# Patient Record
Sex: Male | Born: 1955 | Race: White | Hispanic: No | Marital: Single | Smoking: Former smoker
Health system: Southern US, Community
[De-identification: ages and names within clinical notes are randomized; demographics above are authoritative.]

## PROBLEM LIST (undated history)

## (undated) ENCOUNTER — Emergency Department (HOSPITAL_COMMUNITY): Payer: Self-pay | Source: Home / Self Care

## (undated) DIAGNOSIS — I10 Essential (primary) hypertension: Secondary | ICD-10-CM

## (undated) DIAGNOSIS — J449 Chronic obstructive pulmonary disease, unspecified: Secondary | ICD-10-CM

## (undated) DIAGNOSIS — E119 Type 2 diabetes mellitus without complications: Secondary | ICD-10-CM

## (undated) DIAGNOSIS — E785 Hyperlipidemia, unspecified: Secondary | ICD-10-CM

## (undated) DIAGNOSIS — I739 Peripheral vascular disease, unspecified: Secondary | ICD-10-CM

## (undated) DIAGNOSIS — I890 Lymphedema, not elsewhere classified: Secondary | ICD-10-CM

## (undated) HISTORY — DX: Hyperlipidemia, unspecified: E78.5

## (undated) HISTORY — DX: Peripheral vascular disease, unspecified: I73.9

---

## 2006-08-09 ENCOUNTER — Ambulatory Visit: Payer: Self-pay | Admitting: Family Medicine

## 2006-08-19 ENCOUNTER — Ambulatory Visit: Payer: Self-pay | Admitting: Family Medicine

## 2009-04-14 ENCOUNTER — Emergency Department: Payer: Self-pay | Admitting: Emergency Medicine

## 2012-06-07 ENCOUNTER — Ambulatory Visit: Payer: Self-pay | Admitting: Family Medicine

## 2012-07-17 ENCOUNTER — Ambulatory Visit: Payer: Self-pay | Admitting: Family Medicine

## 2012-09-20 ENCOUNTER — Ambulatory Visit: Payer: Self-pay | Admitting: Internal Medicine

## 2013-04-08 ENCOUNTER — Ambulatory Visit: Payer: Self-pay | Admitting: Internal Medicine

## 2014-02-11 ENCOUNTER — Ambulatory Visit: Payer: Self-pay | Admitting: Internal Medicine

## 2015-06-20 ENCOUNTER — Encounter (HOSPITAL_COMMUNITY): Payer: Self-pay | Admitting: Emergency Medicine

## 2015-06-20 HISTORY — DX: Essential (primary) hypertension: I10

## 2015-06-20 HISTORY — DX: Chronic obstructive pulmonary disease, unspecified: J44.9

## 2015-07-02 ENCOUNTER — Encounter (HOSPITAL_COMMUNITY): Payer: Self-pay | Admitting: Emergency Medicine

## 2015-12-23 ENCOUNTER — Ambulatory Visit: Payer: Self-pay | Admitting: Surgery

## 2015-12-28 ENCOUNTER — Encounter: Payer: Medicaid Other | Attending: Surgery | Admitting: Surgery

## 2015-12-28 ENCOUNTER — Ambulatory Visit
Admission: RE | Admit: 2015-12-28 | Discharge: 2015-12-28 | Disposition: A | Discharge status: Home / Self Care | Payer: Medicaid Other | Source: Ambulatory Visit | Attending: Surgery | Admitting: Surgery

## 2015-12-28 ENCOUNTER — Other Ambulatory Visit: Payer: Self-pay | Admitting: Surgery

## 2015-12-28 DIAGNOSIS — I10 Essential (primary) hypertension: Secondary | ICD-10-CM | POA: Insufficient documentation

## 2015-12-28 DIAGNOSIS — E11621 Type 2 diabetes mellitus with foot ulcer: Secondary | ICD-10-CM | POA: Diagnosis not present

## 2015-12-28 DIAGNOSIS — M868X7 Other osteomyelitis, ankle and foot: Secondary | ICD-10-CM | POA: Diagnosis not present

## 2015-12-28 DIAGNOSIS — E785 Hyperlipidemia, unspecified: Secondary | ICD-10-CM | POA: Diagnosis not present

## 2015-12-28 DIAGNOSIS — L0889 Other specified local infections of the skin and subcutaneous tissue: Secondary | ICD-10-CM | POA: Insufficient documentation

## 2015-12-28 DIAGNOSIS — I890 Lymphedema, not elsewhere classified: Secondary | ICD-10-CM | POA: Insufficient documentation

## 2015-12-28 DIAGNOSIS — I739 Peripheral vascular disease, unspecified: Secondary | ICD-10-CM | POA: Insufficient documentation

## 2015-12-28 DIAGNOSIS — M869 Osteomyelitis, unspecified: Secondary | ICD-10-CM

## 2015-12-28 DIAGNOSIS — F17218 Nicotine dependence, cigarettes, with other nicotine-induced disorders: Secondary | ICD-10-CM | POA: Diagnosis not present

## 2015-12-28 DIAGNOSIS — E1165 Type 2 diabetes mellitus with hyperglycemia: Secondary | ICD-10-CM | POA: Diagnosis not present

## 2015-12-28 DIAGNOSIS — Z9889 Other specified postprocedural states: Secondary | ICD-10-CM | POA: Diagnosis not present

## 2015-12-28 DIAGNOSIS — L97512 Non-pressure chronic ulcer of other part of right foot with fat layer exposed: Secondary | ICD-10-CM | POA: Insufficient documentation

## 2015-12-29 NOTE — Progress Notes (Addendum)
Dennis Fitzgerald, Dennis Fitzgerald ([id number]) Visit Report for 12/28/2015 Abuse/Suicide Risk Screen Details Patient Name: Dennis Fitzgerald, Dennis Fitzgerald Date of Service: 12/28/2015 10:15 AM Medical Record Number: [id number] Patient Account Number: [id number] Date of Birth/Sex: 09-27-1956 (59 y.o. Male) Treating RN: Curtis Sites Primary Care Physician: Darreld Mclean Other Clinician: Referring Physician: Darreld Mclean Treating Physician/Extender: Rudene Re in Treatment: 0 Abuse/Suicide Risk Screen Items Answer ABUSE/SUICIDE RISK SCREEN: Has anyone close to you tried to hurt or harm you recentlyo No Do you feel uncomfortable with anyone in your familyo No Has anyone forced you do things that you didnot want to doo No Do you have any thoughts of harming yourselfo No Patient displays signs or symptoms of abuse and/or neglect. No Electronic Signature(s) Signed: 12/30/2015 11:38:14 AM By: Curtis Sites Previous Signature: 12/28/2015 5:10:42 PM Version By: Curtis Sites Entered By: Curtis Sites on 12/30/2015 11:38:14 Dennis Fitzgerald ([id number]) -------------------------------------------------------------------------------- Activities of Daily Living Details Patient Name: Dennis Fitzgerald Date of Service: 12/28/2015 10:15 AM Medical Record Number: [id number] Patient Account Number: [id number] Date of Birth/Sex: [birth year] (60 y.o. Male) Treating RN: Curtis Sites Primary Care Physician: Darreld Mclean Other Clinician: Referring Physician: Darreld Mclean Treating Physician/Extender: Rudene Re in Treatment: 0 Activities of Daily Living Items Answer Activities of Daily Living (Please select one for each item) Drive Automobile Completely Able Take Medications Completely Able Use Telephone Completely Able Care for Appearance Completely Able Use Toilet Completely Able Bath / Shower Completely Able Dress Self Completely Able Feed Self Completely Able Walk Completely Able Get In / Out Bed Completely Able Housework  Completely Able Prepare Meals Completely Able Handle Money Completely Able Shop for Self Completely Able Electronic Signature(s) Signed: 12/30/2015 11:38:31 AM By: Curtis Sites Previous Signature: 12/28/2015 5:10:42 PM Version By: Curtis Sites Entered By: Curtis Sites on 12/30/2015 11:38:31 Dennis Fitzgerald ([id number]) -------------------------------------------------------------------------------- Education Assessment Details Patient Name: Dennis Fitzgerald Date of Service: 12/28/2015 10:15 AM Medical Record Number: [id number] Patient Account Number: [id number] Date of Birth/Sex: 05-03-1956 (60 y.o. Male) Treating RN: Curtis Sites Primary Care Physician: Darreld Mclean Other Clinician: Referring Physician: Darreld Mclean Treating Physician/Extender: Rudene Re in Treatment: 0 Primary Learner Assessed: Patient Learning Preferences/Education Level/Primary Language Learning Preference: Explanation, Demonstration Highest Education Level: High School Preferred Language: English Cognitive Barrier Assessment/Beliefs Language Barrier: No Translator Needed: No Memory Deficit: No Emotional Barrier: No Cultural/Religious Beliefs Affecting Medical No Care: Physical Barrier Assessment Impaired Vision: No Impaired Hearing: No Decreased Hand dexterity: No Knowledge/Comprehension Assessment Knowledge Level: Medium Comprehension Level: Medium Ability to understand written Medium instructions: Ability to understand verbal Medium instructions: Motivation Assessment Anxiety Level: Calm Cooperation: Cooperative Education Importance: Acknowledges Need Interest in Health Problems: Asks Questions Perception: Coherent Willingness to Engage in Self- Medium Management Activities: Readiness to Engage in Self- Medium Management Activities: Electronic Signature(s) Dennis Fitzgerald, Dennis Fitzgerald ([id number]) Signed: 12/30/2015 11:38:43 AM By: Curtis Sites Previous Signature: 12/28/2015 5:10:42 PM Version  By: Curtis Sites Entered By: Curtis Sites on 12/30/2015 11:38:42 Dennis Fitzgerald ([id number]) -------------------------------------------------------------------------------- Fall Risk Assessment Details Patient Name: Dennis Fitzgerald Date of Service: 12/28/2015 10:15 AM Medical Record Number: [id number] Patient Account Number: [id number] Date of Birth/Sex: Jan 05, 1956 (60 y.o. Male) Treating RN: Curtis Sites Primary Care Physician: Darreld Mclean Other Clinician: Referring Physician: Darreld Mclean Treating Physician/Extender: Rudene Re in Treatment: 0 Fall Risk Assessment Items Have you had 2 or more falls in the last 12 monthso 0 Yes Have you had any fall that resulted in injury in the last 12 monthso 0 No FALL RISK ASSESSMENT: History of falling - immediate or within 3  months 25 Yes Secondary diagnosis 0 No Ambulatory aid None/bed rest/wheelchair/nurse 0 Yes Crutches/cane/walker 0 No Furniture 0 No IV Access/Saline Lock 0 No Gait/Training Normal/bed rest/immobile 0 No Weak 10 Yes Impaired 0 No Mental Status Oriented to own ability 0 Yes Electronic Signature(s) Signed: 12/30/2015 11:38:51 AM By: Curtis Sitesorthy, Joanna Previous Signature: 12/28/2015 5:10:42 PM Version By: Curtis Sitesorthy, Joanna Entered By: Curtis Sitesorthy, Joanna on 12/30/2015 11:38:51 Dennis ShadowBREWER, Dennis Fitzgerald ([id number]) -------------------------------------------------------------------------------- Foot Assessment Details Patient Name: Dennis ShadowBREWER, Dennis Fitzgerald Date of Service: 12/28/2015 10:15 AM Medical Record Number: [id number] Patient Account Number: [id number] Date of Birth/Sex: Dec 14, 1956 [age] y.o. Male) Treating RN: Curtis Sitesorthy, Joanna Primary Care Physician: Darreld McleanMILES, LINDA Other Clinician: Referring Physician: Darreld McleanMILES, LINDA Treating Physician/Extender: Rudene ReBritto, Errol Weeks in Treatment: 0 Foot Assessment Items Site Locations + = Sensation present, - = Sensation absent, C = Callus, U = Ulcer R = Redness, W = Warmth, M = Maceration, PU =  Pre-ulcerative lesion F = Fissure, S = Swelling, D = Dryness Assessment Right: Left: Other Deformity: No No Prior Foot Ulcer: No No Prior Amputation: No No Charcot Joint: No No Ambulatory Status: Ambulatory Without Help Gait: Unsteady Electronic Signature(s) Signed: 12/30/2015 11:39:08 AM By: Curtis Sitesorthy, Joanna Previous Signature: 12/28/2015 5:10:42 PM Version By: Curtis Sitesorthy, Joanna Entered By: Curtis Sitesorthy, Joanna on 12/30/2015 11:39:08 Dennis ShadowBREWER, Dennis Fitzgerald ([id number]) -------------------------------------------------------------------------------- Nutrition Risk Assessment Details Patient Name: Dennis ShadowBREWER, Dennis Fitzgerald Date of Service: 12/28/2015 10:15 AM Medical Record Number: [id number] Patient Account Number: [id number] Date of Birth/Sex: Dec 14, 1956 [age] y.o. Male) Treating RN: Curtis Sitesorthy, Joanna Primary Care Physician: Darreld McleanMILES, LINDA Other Clinician: Referring Physician: Darreld McleanMILES, LINDA Treating Physician/Extender: Rudene ReBritto, Errol Weeks in Treatment: 0 Height (in): 71 Weight (lbs): 267 Body Mass Index (BMI): 37.2 Nutrition Risk Assessment Items NUTRITION RISK SCREEN: I have an illness or condition that made me change the kind and/or 0 No amount of food I eat I eat fewer than two meals per day 0 No I eat few fruits and vegetables, or milk products 0 No I have three or more drinks of beer, liquor or wine almost every day 0 No I have tooth or mouth problems that make it hard for me to eat 0 No I don't always have enough money to buy the food I need 0 No I eat alone most of the time 0 No I take three or more different prescribed or over-the-counter drugs a 1 Yes day Without wanting to, I have lost or gained 10 pounds in the last six 0 No months I am not always physically able to shop, cook and/or feed myself 0 No Nutrition Protocols Good Risk Protocol 0 No interventions needed Moderate Risk Protocol Electronic Signature(s) Signed: 12/30/2015 11:39:00 AM By: Curtis Sitesorthy, Joanna Previous Signature: 12/28/2015 5:10:42 PM  Version By: Curtis Sitesorthy, Joanna Entered By: Curtis Sitesorthy, Joanna on 12/30/2015 11:39:00

## 2015-12-29 NOTE — Progress Notes (Addendum)
ANDRW, MCGUIRT ([id number]) Visit Report for 12/28/2015 Allergy List Details Patient Name: Dennis Fitzgerald, Dennis Fitzgerald Date of Service: 12/28/2015 10:15 AM Medical Record Number: [id number] Patient Account Number: [id number] Date of Birth/Sex: [birth year] (60 y.o. Male) Treating RN: Curtis Sites Primary Care Physician: Darreld Mclean Other Clinician: Referring Physician: Darreld Mclean Treating Physician/Extender: Rudene Re in Treatment: 0 Allergies Active Allergies No Known Allergies Allergy Notes Electronic Signature(s) Signed: 12/30/2015 11:37:52 AM By: Curtis Sites Previous Signature: 12/28/2015 5:10:42 PM Version By: Curtis Sites Entered By: Curtis Sites on 12/30/2015 11:37:52 Dennis Fitzgerald ([id number]) -------------------------------------------------------------------------------- Arrival Information Details Patient Name: Dennis Fitzgerald Date of Service: 12/28/2015 10:15 AM Medical Record Number: [id number] Patient Account Number: [id number] Date of Birth/Sex: 1956-04-17 (60 y.o. Male) Treating RN: Curtis Sites Primary Care Physician: Darreld Mclean Other Clinician: Referring Physician: Darreld Mclean Treating Physician/Extender: Rudene Re in Treatment: 0 Visit Information Patient Arrived: Ambulatory Arrival Time: 10:23 Accompanied By: self Transfer Assistance: None Patient Identification Verified: Yes Secondary Verification Process Yes Completed: Patient Has Alerts: Yes Patient Alerts: DMII aspirin 81 Electronic Signature(s) Signed: 12/30/2015 11:37:21 AM By: Curtis Sites Previous Signature: 12/28/2015 5:10:42 PM Version By: Curtis Sites Entered By: Curtis Sites on 12/30/2015 11:37:21 Dennis Fitzgerald ([id number]) -------------------------------------------------------------------------------- Clinic Level of Care Assessment Details Patient Name: Dennis Fitzgerald Date of Service: 12/28/2015 10:15 AM Medical Record Number: [id number] Patient Account Number: [id number] Date  of Birth/Sex: [birth year] (60 y.o. Male) Treating RN: Curtis Sites Primary Care Physician: Darreld Mclean Other Clinician: Referring Physician: Darreld Mclean Treating Physician/Extender: Rudene Re in Treatment: 0 Clinic Level of Care Assessment Items TOOL 1 Quantity Score []  - Use when EandM and Procedure is performed on INITIAL visit 0 ASSESSMENTS - Nursing Assessment / Reassessment X - General Physical Exam (combine w/ comprehensive assessment (listed just 1 20 below) when performed on new pt. evals) X - Comprehensive Assessment (HX, ROS, Risk Assessments, Wounds Hx, etc.) 1 25 ASSESSMENTS - Wound and Skin Assessment / Reassessment []  - Dermatologic / Skin Assessment (not related to wound area) 0 ASSESSMENTS - Ostomy and/or Continence Assessment and Care []  - Incontinence Assessment and Management 0 []  - Ostomy Care Assessment and Management (repouching, etc.) 0 PROCESS - Coordination of Care X - Simple Patient / Family Education for ongoing care 1 15 []  - Complex (extensive) Patient / Family Education for ongoing care 0 []  - Staff obtains Chiropractor, Records, Test Results / Process Orders 0 []  - Staff telephones HHA, Nursing Homes / Clarify orders / etc 0 []  - Routine Transfer to another Facility (non-emergent condition) 0 []  - Routine Hospital Admission (non-emergent condition) 0 X - New Admissions / Manufacturing engineer / Ordering NPWT, Apligraf, etc. 1 15 []  - Emergency Hospital Admission (emergent condition) 0 PROCESS - Special Needs []  - Pediatric / Minor Patient Management 0 []  - Isolation Patient Management 0 Fitzgerald, Dennis ([id number]) []  - Hearing / Language / Visual special needs 0 []  - Assessment of Community assistance (transportation, D/C planning, etc.) 0 []  - Additional assistance / Altered mentation 0 []  - Support Surface(s) Assessment (bed, cushion, seat, etc.) 0 INTERVENTIONS - Miscellaneous []  - External ear exam 0 []  - Patient Transfer (multiple  staff / Nurse, adult / Similar devices) 0 []  - Simple Staple / Suture removal (25 or less) 0 []  - Complex Staple / Suture removal (26 or more) 0 []  - Hypo/Hyperglycemic Management (do not check if billed separately) 0 X - Ankle / Brachial Index (ABI) - do not check if billed separately 1 15 Has the patient been seen  at the hospital within the last three years: Yes Total Score: 90 Level Of Care: New/Established - Level 3 Electronic Signature(s) Signed: 12/28/2015 12:06:20 PM By: Curtis Sites Entered By: Curtis Sites on 12/28/2015 12:06:20 Dennis Fitzgerald ([id number]) -------------------------------------------------------------------------------- Encounter Discharge Information Details Patient Name: Dennis Fitzgerald Date of Service: 12/28/2015 10:15 AM Medical Record Number: [id number] Patient Account Number: [id number] Date of Birth/Sex: Apr 21, 1956 (60 y.o. Male) Treating RN: Curtis Sites Primary Care Physician: Darreld Mclean Other Clinician: Referring Physician: Darreld Mclean Treating Physician/Extender: Rudene Re in Treatment: 0 Encounter Discharge Information Items Discharge Pain Level: 0 Discharge Condition: Stable Ambulatory Status: Ambulatory Discharge Destination: Home Transportation: Private Auto Accompanied By: self Schedule Follow-up Appointment: Yes Medication Reconciliation completed and provided to Patient/Care Yes Malan Werk: Provided on Clinical Summary of Care: 12/28/2015 Form Type Recipient Paper Patient BB Electronic Signature(s) Signed: 12/28/2015 11:42:29 AM By: Gwenlyn Perking Entered By: Gwenlyn Perking on 12/28/2015 11:42:29 Dennis Fitzgerald ([id number]) -------------------------------------------------------------------------------- Lower Extremity Assessment Details Patient Name: Dennis Fitzgerald Date of Service: 12/28/2015 10:15 AM Medical Record Number: [id number] Patient Account Number: [id number] Date of Birth/Sex: 07-02-1956 (60 y.o. Male) Treating RN:  Curtis Sites Primary Care Physician: Darreld Mclean Other Clinician: Referring Physician: Darreld Mclean Treating Physician/Extender: Rudene Re in Treatment: 0 Edema Assessment Assessed: [Left: No] [Right: No] Edema: [Left: Yes] [Right: Yes] Calf Left: Right: Point of Measurement: 32 cm From Medial Instep 47.3 cm 53.5 cm Ankle Left: Right: Point of Measurement: 10 cm From Medial Instep 30.5 cm 32 cm Vascular Assessment Pulses: Posterior Tibial Palpable: [Left:No] [Right:No] Doppler: [Left:Multiphasic] [Right:Monophasic] Dorsalis Pedis Palpable: [Left:Yes] [Right:Yes] Doppler: [Left:Multiphasic] [Right:Monophasic] Extremity colors, hair growth, and conditions: Extremity Color: [Left:Hyperpigmented] [Right:Hyperpigmented] Hair Growth on Extremity: [Left:No] [Right:No] Temperature of Extremity: [Left:Warm] [Right:Warm] Capillary Refill: [Left:< 3 seconds] [Right:< 3 seconds] Blood Pressure: Brachial: [Left:108] [Right:106] Dorsalis Pedis: 106 [Left:Dorsalis Pedis: 102] Ankle: Posterior Tibial: 112 [Left:Posterior Tibial: 110 1.04] [Right:1.02] Toe Nail Assessment Left: Right: Thick: Yes Yes Discolored: Yes Yes Deformed: Yes Yes Improper Length and Hygiene: Yes Yes DMARION, Fitzgerald ([id number]) Electronic Signature(s) Signed: 12/30/2015 11:37:44 AM By: Curtis Sites Previous Signature: 12/28/2015 5:10:42 PM Version By: Curtis Sites Entered By: Curtis Sites on 12/30/2015 11:37:44 Dennis Fitzgerald ([id number]) -------------------------------------------------------------------------------- Multi Wound Chart Details Patient Name: Dennis Fitzgerald Date of Service: 12/28/2015 10:15 AM Medical Record Number: [id number] Patient Account Number: [id number] Date of Birth/Sex: Oct 16, 1956 (60 y.o. 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 Comorbid History: Chronic Obstructive Chronic Obstructive N/A Pulmonary Disease Pulmonary Disease (COPD), Hypertension, (COPD), Hypertension, Peripheral Venous Peripheral Venous Disease, Type II Diabetes, Disease, Type II Diabetes, Neuropathy Neuropathy Date Acquired: 12/13/2015 12/21/2015 N/A Weeks of Treatment: 0 0 N/A Wound Status: Open Open N/A Pending Amputation on Yes No N/A Presentation: 2.7x4.2x0.3 0.3x1.5x0.1 N/A ABDURRAHMAN, PETERSHEIM ([id number]) Measurements L x W x D (cm) Area (cm) : 8.906 0.353 N/A Volume (cm) : 2.672 0.035 N/A % Reduction in Area: 0.00% 0.00% N/A % Reduction in Volume: 0.00% 0.00% N/A Classification: Grade 1 Partial Thickness N/A HBO Classification: N/A Grade 1 N/A Exudate Amount: Medium Medium N/A Exudate Type: Serous Serous N/A Exudate Color: amber amber N/A Foul  Odor After Yes No N/A Cleansing: Odor Anticipated Due to No N/A N/A Product Use: Wound Margin: Flat and Intact Flat and Intact N/A Granulation Amount: None Present (0%) Large (67-100%) N/A Granulation Quality: N/A Red N/A Necrotic Amount: Large (67-100%) None Present (0%) N/A Necrotic Tissue: Eschar, Adherent Slough N/A N/A Exposed Structures: Fascia: No Fascia: No N/A Fat: No Fat: No Tendon: No Tendon: No Muscle: No Muscle: No Joint: No Joint: No Bone: No Bone: No Limited to Skin Limited to Skin Breakdown Breakdown Epithelialization: None Small (1-33%) N/A Debridement: Debridement ([id number]- N/A N/A 11047) Time-Out Taken: Yes N/A N/A Pain Control: Other N/A N/A Tissue Debrided:  Necrotic/Eschar, N/A N/A Fibrin/Slough, Exudates, Skin, Subcutaneous Level: Skin/Subcutaneous N/A N/A Tissue Debridement Area (sq 11.34 N/A N/A cm): Instrument: Forceps, Scissors N/A N/A Bleeding: Minimum N/A N/A Hemostasis Achieved: Pressure N/A N/A Procedural Pain: 0 N/A N/A Post Procedural Pain: 0 N/A N/A Debridement Treatment Procedure was tolerated N/A N/A Response: well 2.7x4.2x0.6 N/A N/A Dennis, Fitzgerald ([id number]) Post Debridement Measurements L x W x D (cm) Post Debridement 5.344 N/A N/A Volume: (cm) Periwound Skin Texture: Edema: Yes Edema: No N/A Excoriation: No Excoriation: No Induration: No Induration: No Callus: No Callus: No Crepitus: No Crepitus: No Fluctuance: No Fluctuance: No Friable: No Friable: No Rash: No Rash: No Scarring: No Scarring: No Periwound Skin Maceration: No Maceration: No N/A Moisture: Moist: No Moist: No Dry/Scaly: No Dry/Scaly: No Periwound Skin Color: Erythema: Yes Atrophie Blanche: No N/A Atrophie Blanche: No Cyanosis: No Cyanosis: No Ecchymosis: No Ecchymosis: No Erythema: No Hemosiderin Staining: No Hemosiderin Staining: No Mottled: No Mottled: No Pallor: No Pallor: No Rubor: No Rubor: No Erythema Location: Circumferential N/A N/A Temperature: No Abnormality No Abnormality N/A Tenderness on Yes No N/A Palpation: Wound Preparation: Ulcer Cleansing: Ulcer Cleansing: N/A Rinsed/Irrigated with Rinsed/Irrigated with Saline Saline Topical Anesthetic Topical Anesthetic Applied: Other: lidocaine Applied: Other: lidocaine 4% 4% Procedures Performed: Debridement N/A N/A Treatment Notes Wound #1 (Right, Lateral Foot) 1. Cleansed with: Clean wound with Normal Saline 2. Anesthetic Topical Lidocaine 4% cream to wound bed prior to debridement 4. Dressing Applied: Santyl Ointment 5. Secondary Dressing Applied ABD and Kerlix/Conform Fitzgerald, Dennis ([id number]) Wound #2 (Right, Medial Lower Leg) 1. Cleansed  with: Clean wound with Normal Saline 2. Anesthetic Topical Lidocaine 4% cream to wound bed prior to debridement 4. Dressing Applied: Prisma Ag 5. Secondary Dressing Applied Bordered Foam Dressing Electronic Signature(s) Signed: 12/30/2015 11:39:38 AM By: Curtis Sites Previous Signature: 12/28/2015 5:10:42 PM Version By: Curtis Sites Entered By: Curtis Sites on 12/30/2015 11:39:38 Dennis Fitzgerald ([id number]) -------------------------------------------------------------------------------- Multi-Disciplinary Care Plan Details Patient Name: Dennis Fitzgerald Date of Service: 12/28/2015 10:15 AM Medical Record Number: [id number] Patient Account Number: [id number] Date of Birth/Sex: Jan 11, 1956 (60 y.o. Male) Treating RN: Curtis Sites Primary Care Physician: Darreld Mclean Other Clinician: Referring Physician: Darreld Mclean Treating Physician/Extender: Rudene Re in Treatment: 0 Active Inactive Abuse / Safety / Falls / Self Care Management Nursing Diagnoses: Impaired physical mobility Potential for falls Goals: Patient will remain injury free Date Initiated: 12/28/2015 Goal Status: Active Interventions: Assess fall risk on admission and as needed Notes: Orientation to the Wound Care Program Nursing Diagnoses: Knowledge deficit related to the wound healing center program Goals: Patient/caregiver will verbalize understanding of the Wound Healing Center Program Date Initiated: 12/28/2015 Goal Status: Active Interventions: Provide education on orientation to the wound center Notes: Wound/Skin Impairment Nursing Diagnoses: Impaired tissue integrity Goals: Patient/caregiver will verbalize understanding of skin care regimen Dennis, Fitzgerald ([id number]) Date Initiated: 12/28/2015 Goal Status:  Active Ulcer/skin breakdown will have a volume reduction of 30% by week 4 Date Initiated: 12/28/2015 Goal Status: Active Ulcer/skin breakdown will have a volume reduction of 50% by week 8 Date  Initiated: 12/28/2015 Goal Status: Active Ulcer/skin breakdown will have a volume reduction of 80% by week 12 Date Initiated: 12/28/2015 Goal Status: Active Ulcer/skin breakdown will heal within 14 weeks Date Initiated: 12/28/2015 Goal Status: Active Interventions: Assess patient/caregiver ability to obtain necessary supplies Assess patient/caregiver ability to perform ulcer/skin care regimen upon admission and as needed Assess ulceration(s) every visit Notes: Electronic Signature(s) Signed: 12/30/2015 11:39:18 AM By: Curtis Sitesorthy, Joanna Previous Signature: 12/28/2015 12:05:37 PM Version By: Curtis Sitesorthy, Joanna Entered By: Curtis Sitesorthy, Joanna on 12/30/2015 11:39:18 Dennis ShadowBREWER, Dennis ([id number]) -------------------------------------------------------------------------------- Patient/Caregiver Education Details Patient Name: Dennis ShadowBREWER, Dennis Date of Service: 12/28/2015 10:15 AM Medical Record Number: [id number] Patient Account Number: [id number] Date of Birth/Gender: 1956-08-23 [age] y.o. Male) Treating RN: Curtis Sitesorthy, Joanna Primary Care Physician: Darreld McleanMILES, LINDA Other Clinician: Referring Physician: Darreld McleanMILES, LINDA Treating Physician/Extender: Rudene ReBritto, Errol Weeks in Treatment: 0 Education Assessment Education Provided To: Patient Education Topics Provided Wound/Skin Impairment: Other: change dressings as directed, get xrays as ordered, and arterial studies completed in Handouts: [place] Methods: Demonstration, Explain/Verbal Responses: State content correctly Electronic Signature(s) Signed: 12/28/2015 5:10:42 PM By: Curtis Sitesorthy, Joanna Entered By: Curtis Sitesorthy, Joanna on 12/28/2015 11:36:50 Dennis ShadowBREWER, Dennis ([id number]) -------------------------------------------------------------------------------- Wound Assessment Details Patient Name: Dennis ShadowBREWER, Dennis Date of Service: 12/28/2015 10:15 AM Medical Record Number: [id number] Patient Account Number: [id number] Date of Birth/Sex: 1956-08-23 81(59 y.o. Male) Treating RN: Curtis Sitesorthy,  Joanna Primary Care Physician: Darreld McleanMILES, LINDA Other Clinician: Referring Physician: Darreld McleanMILES, LINDA Treating Physician/Extender: BURNS III, WALTER Weeks in Treatment: 0 Wound Status Wound Number: 1 Primary Diabetic Wound/Ulcer of the Lower Etiology: Extremity Wound Location: Right Foot - Lateral Wound Open Wounding Event: Gradually Appeared Status: Date Acquired: 12/13/2015 Comorbid Chronic Obstructive Pulmonary Disease Weeks Of Treatment: 0 History: (COPD), Hypertension, Peripheral Clustered Wound: No Venous Disease, Type II Diabetes, Pending Amputation On Presentation Neuropathy Photos Wound Measurements Length: (cm) 2.7 Width: (cm) 4.2 Depth: (cm) 0.3 Area: (cm) 8.906 Volume: (cm) 2.672 % Reduction in Area: 0% % Reduction in Volume: 0% Epithelialization: None Tunneling: No Undermining: No Wound Description Classification: Grade 1 Wound Margin: Flat and Intact Exudate Amount: Medium Exudate Type: Serous Exudate Color: amber Foul Odor After Cleansing: Yes Due to Product Use: No Wound Bed Granulation Amount: None Present (0%) Exposed Structure Necrotic Amount: Large (67-100%) Fascia Exposed: No Necrotic Quality: Eschar, Adherent Slough Fat Layer Exposed: No Tendon Exposed: No Balthazor, Kalen ([id number]) Muscle Exposed: No Joint Exposed: No Bone Exposed: No Limited to Skin Breakdown Periwound Skin Texture Texture Color No Abnormalities Noted: No No Abnormalities Noted: No Callus: No Atrophie Blanche: No Crepitus: No Cyanosis: No Excoriation: No Ecchymosis: No Fluctuance: No Erythema: Yes Friable: No Erythema Location: Circumferential Induration: No Hemosiderin Staining: No Localized Edema: Yes Mottled: No Rash: No Pallor: No Scarring: No Rubor: No Moisture Temperature / Pain No Abnormalities Noted: No Temperature: No Abnormality Dry / Scaly: No Tenderness on Palpation: Yes Maceration: No Moist: No Wound Preparation Ulcer Cleansing:  Rinsed/Irrigated with Saline Topical Anesthetic Applied: Other: lidocaine 4%, Treatment Notes Wound #1 (Right, Lateral Foot) 1. Cleansed with: Clean wound with Normal Saline 2. Anesthetic Topical Lidocaine 4% cream to wound bed prior to debridement 4. Dressing Applied: Santyl Ointment 5. Secondary Dressing Applied ABD and Kerlix/Conform Electronic Signature(s) Signed: 12/28/2015 12:07:22 PM By: Curtis Sitesorthy, Joanna Entered By: Curtis Sitesorthy, Joanna on 12/28/2015 12:07:22 Dennis ShadowBREWER, Stafford ([id number]) -------------------------------------------------------------------------------- Wound Assessment Details Patient Name: Charmian MuffBREWER,  Dennis Date of Service: 12/28/2015 10:15 AM Medical Record Number: [id number] Patient Account Number: [id number] Date of Birth/Sex: 1956-03-02 (60 y.o. Male) Treating RN: Curtis Sites Primary Care Physician: Darreld Mclean Other Clinician: Referring Physician: Darreld Mclean Treating Physician/Extender: BURNS III, WALTER Weeks in Treatment: 0 Wound Status Wound Number: 2 Primary Lymphedema Etiology: Wound Location: Right Lower Leg - Medial Wound Open Wounding Event: Gradually Appeared Status: Date Acquired: 12/21/2015 Comorbid Chronic Obstructive Pulmonary Disease Weeks Of Treatment: 0 History: (COPD), Hypertension, Peripheral Clustered Wound: No Venous Disease, Type II Diabetes, Neuropathy Photos Wound Measurements Length: (cm) 0.3 Width: (cm) 1.5 Depth: (cm) 0.1 Area: (cm) 0.353 Volume: (cm) 0.035 % Reduction in Area: 0% % Reduction in Volume: 0% Epithelialization: Small (1-33%) Tunneling: No Undermining: No Wound Description Classification: Partial Thickness Foul Odor Diabetic Severity (Wagner): Grade 1 Wound Margin: Flat and Intact Exudate Amount: Medium Exudate Type: Serous Exudate Color: amber After Cleansing: No Wound Bed Granulation Amount: Large (67-100%) Exposed Structure Granulation Quality: Red Fascia Exposed: No Necrotic Amount: None  Present (0%) Fat Layer Exposed: No Fitzgerald, Dennis ([id number]) Tendon Exposed: No Muscle Exposed: No Joint Exposed: No Bone Exposed: No Limited to Skin Breakdown Periwound Skin Texture Texture Color No Abnormalities Noted: No No Abnormalities Noted: No Callus: No Atrophie Blanche: No Crepitus: No Cyanosis: No Excoriation: No Ecchymosis: No Fluctuance: No Erythema: No Friable: No Hemosiderin Staining: No Induration: No Mottled: No Localized Edema: No Pallor: No Rash: No Rubor: No Scarring: No Temperature / Pain Moisture Temperature: No Abnormality No Abnormalities Noted: No Dry / Scaly: No Maceration: No Moist: No Wound Preparation Ulcer Cleansing: Rinsed/Irrigated with Saline Topical Anesthetic Applied: Other: lidocaine 4%, Treatment Notes Wound #2 (Right, Medial Lower Leg) 1. Cleansed with: Clean wound with Normal Saline 2. Anesthetic Topical Lidocaine 4% cream to wound bed prior to debridement 4. Dressing Applied: Prisma Ag 5. Secondary Dressing Applied Bordered Foam Dressing Electronic Signature(s) Signed: 12/28/2015 12:07:46 PM By: Curtis Sites Entered By: Curtis Sites on 12/28/2015 12:07:46 Dennis Fitzgerald ([id number]) -------------------------------------------------------------------------------- Vitals Details Patient Name: Dennis Fitzgerald Date of Service: 12/28/2015 10:15 AM Medical Record Number: [id number] Patient Account Number: [id number] Date of Birth/Sex: 11/16/1956 (60 y.o. Male) Treating RN: Curtis Sites Primary Care Physician: Darreld Mclean Other Clinician: Referring Physician: Darreld Mclean Treating Physician/Extender: Rudene Re in Treatment: 0 Vital Signs Time Taken: 10:26 Temperature (F): 98.2 Height (in): 71 Pulse (bpm): 87 Source: Stated Respiratory Rate (breaths/min): 18 Weight (lbs): 267 Blood Pressure (mmHg): 107/89 Source: Measured Reference Range: 80 - 120 mg / dl Body Mass Index (BMI): 37.2 Electronic  Signature(s) Signed: 12/30/2015 11:37:30 AM By: Curtis Sites Previous Signature: 12/28/2015 5:10:42 PM Version By: Curtis Sites Entered By: Curtis Sites on 12/30/2015 11:37:30

## 2016-01-04 ENCOUNTER — Encounter: Payer: Medicaid Other | Admitting: Surgery

## 2016-01-04 DIAGNOSIS — E11621 Type 2 diabetes mellitus with foot ulcer: Secondary | ICD-10-CM | POA: Diagnosis not present

## 2016-01-04 NOTE — Progress Notes (Addendum)
SHAKAI, DOLLEY ([id number]) Visit Report for 01/04/2016 Chief Complaint Document Details Patient Name: Dennis Fitzgerald, Dennis Fitzgerald Date of Service: 01/04/2016 8:00 AM Medical Record Number: [id number] Patient Account Number: [id number] Date of Birth/Sex: May 04, 1956 (60 y.o. Male) Treating RN: Phillis Haggis Primary Care Physician: Darreld Mclean Other Clinician: Referring Physician: Darreld Mclean Treating Physician/Extender: Rudene Re in Treatment: 1 Information Obtained from: Patient Chief Complaint Patients presents for treatment of an open diabetic ulcer to his right lateral foot for about 3 weeks now. Electronic Signature(s) Signed: 01/04/2016 8:45:22 AM By: Evlyn Kanner MD, FACS Entered By: Evlyn Kanner on 01/04/2016 08:45:22 Dennis Fitzgerald ([id number]) -------------------------------------------------------------------------------- Debridement Details Patient Name: Dennis Fitzgerald Date of Service: 01/04/2016 8:00 AM Medical Record Number: [id number] Patient Account Number: [id number] Date of Birth/Sex: 01/16/1956 (60 y.o. Male) Treating RN: Ashok Cordia, Debi Primary Care Physician: Darreld Mclean Other Clinician: Referring Physician: Darreld Mclean Treating Physician/Extender: Rudene Re in Treatment: 1 Debridement Performed for Wound #1 Right,Lateral Foot Assessment: Performed By: Physician Evlyn Kanner, MD Debridement: Debridement Pre-procedure Yes Verification/Time Out Taken: Start Time: 08:38 Pain Control: Other : lidocaine 4% cream Level: Skin/Subcutaneous Tissue Total Area Debrided (L x 2.4 (cm) x 4 (cm) = 9.6 (cm) W): Tissue and other Viable, Non-Viable, Fibrin/Slough, Subcutaneous material debrided: Instrument: Forceps, Scissors Bleeding: Minimum End Time: 08:42 Procedural Pain: 0 Post Procedural Pain: 0 Response to Treatment: Procedure was tolerated well Post Debridement Measurements of Total Wound Length: (cm) 2.4 Width: (cm) 4 Depth: (cm) 0.4 Volume:  (cm) 3.016 Post Procedure Diagnosis Same as Pre-procedure Electronic Signature(s) Signed: 01/04/2016 8:45:15 AM By: Evlyn Kanner MD, FACS Signed: 01/04/2016 5:14:45 PM By: Alejandro Mulling Entered By: Evlyn Kanner on 01/04/2016 08:45:15 Dennis Fitzgerald ([id number]) -------------------------------------------------------------------------------- HPI Details Patient Name: Dennis Fitzgerald Date of Service: 01/04/2016 8:00 AM Medical Record Number: [id number] Patient Account Number: [id number] Date of Birth/Sex: 26-Aug-1956 (60 y.o. Male) Treating RN: Phillis Haggis Primary Care Physician: Darreld Mclean Other Clinician: Referring Physician: Darreld Mclean Treating Physician/Extender: Rudene Re in Treatment: 1 History of Present Illness Location: right lateral foot noted to have a ulcerated area with purulent drainage Quality: Patient reports No Pain. Severity: Patient states wound are getting worse. Duration: Patient has had the wound for < 4 weeks prior to presenting for treatment Context: The wound appeared gradually over time Modifying Factors: Other treatment(s) tried include: he was put on a course of Augmentin by his PCP Associated Signs and Symptoms: Patient reports having increase discharge. HPI Description: A 60 year old diabetic patient who was noted by his PCP, Dr. Darreld Mclean to have a ulceration on the right foot for about 3 weeks now. Patient's past medical history significant for diabetes mellitus with the last hemoglobin A1c being 7.4 in May 2016. Also known to have hypertension, hyperlipidemia, peripheral vascular disease for which he is being followed up at the [facility] vascular surgery department and has had a vascular procedures done. review of his vascular notes from [facility] revealed that he is had a left lower extremity vein bypass graft in August 2014 with a favorable graft study. he had a left common femoral artery to below-knee popliteal bypass graft with reversed great  saphenous vein and a left common femoral artery endarterectomy and patch angioplasty.He also has bilateral lower extremity venous insufficiency, lymphedema. he was asked to wear compression garments for his bilateral lymphedema. last arterial duplex study done in May 2016 showed left lower extremity patent graft with no elevated velocities and toe pressure was 76 mmHg. He has been a smoker for several years and at present  continues to smoke. He was referred to [place] for evaluation and treatment of his diabetic foot ulcer on the right and was prescribed a 10 day course of Augmentin recently. 01/04/2016 -- patient was seen by the vascular team at [facility] and the assessment and plan was that of bilateral peripheral arterial disease status post left lower extremity vein bypass graft with favorable scan done on 01/01/2016. The right lower extremity peripheral arterial disease with SFA stenosis 50-75% GTP 38 strong DP and PT signals. Should be able to heal wound but will have follow-up with the vascular surgeon in 2 weeks to discuss need for surgical debridement and revascularization. -- x-ray of the right foot was done on 12/28/2015 and it shows soft tissue wound along the lateral aspect of the foot with no bony abnormality except for diffuse degenerative changes. Electronic Signature(s) Signed: 01/04/2016 8:45:38 AM By: Evlyn Kanner MD, FACS Previous Signature: 01/04/2016 8:15:40 AM Version By: Evlyn Kanner MD, FACS Previous Signature: 01/04/2016 8:13:39 AM Version By: Evlyn Kanner MD, FACS Entered By: Evlyn Kanner on 01/04/2016 08:45:37 GUILHERME, SCHWENKE ([id number]) -------------------------------------------------------------------------------- Physical Exam Details Patient Name: Dennis Fitzgerald Date of Service: 01/04/2016 8:00 AM Medical Record Number: [id number] Patient Account Number: [id number] Date of Birth/Sex: 01/07/1956 (60 y.o. Male) Treating RN: Phillis Haggis Primary Care Physician: Darreld Mclean Other Clinician: Referring Physician: Darreld Mclean Treating Physician/Extender: Rudene Re in Treatment: 1 Constitutional . Pulse regular. Respirations normal and unlabored. Afebrile. . Eyes Nonicteric. Reactive to light. Ears, Nose, Mouth, and Throat Lips, teeth, and gums WNL.Marland Kitchen Moist mucosa without lesions. Neck supple and nontender. No palpable supraclavicular or cervical adenopathy. Normal sized without goiter. Respiratory WNL. No retractions.. Cardiovascular Pedal Pulses WNL. No clubbing, cyanosis or edema. Lymphatic No adneopathy. No adenopathy. No adenopathy. Musculoskeletal Adexa without tenderness or enlargement.. Digits and nails w/o clubbing, cyanosis, infection, petechiae, ischemia, or inflammatory conditions.. Integumentary (Hair, Skin) No suspicious lesions. No crepitus or fluctuance. No peri-wound warmth or erythema. No masses.Marland Kitchen Psychiatric Judgement and insight Intact.. No evidence of depression, anxiety, or agitation.. Notes he still continues to have a lot of necrotic debris in the right lateral foot and I have sharply debrided with the forceps and scissors, down to the subcutaneous tissue.. Electronic Signature(s) Signed: 01/04/2016 8:46:41 AM By: Evlyn Kanner MD, FACS Entered By: Evlyn Kanner on 01/04/2016 08:46:40 Dennis Fitzgerald ([id number]) -------------------------------------------------------------------------------- Physician Orders Details Patient Name: Dennis Fitzgerald Date of Service: 01/04/2016 8:00 AM Medical Record Number: [id number] Patient Account Number: [id number] Date of Birth/Sex: 1956/07/25 (60 y.o. Male) Treating RN: Phillis Haggis Primary Care Physician: Darreld Mclean Other Clinician: Referring Physician: Darreld Mclean Treating Physician/Extender: Rudene Re in Treatment: 1 Verbal / Phone Orders: Yes ClinicianAshok Cordia, Debi Read Back and Verified: Yes Diagnosis Coding Wound Cleansing Wound #1 Right,Lateral  Foot o Clean wound with Normal Saline. Wound #2 Right,Medial Lower Leg o Clean wound with Normal Saline. Anesthetic Wound #1 Right,Lateral Foot o Topical Lidocaine 4% cream applied to wound bed prior to debridement Wound #2 Right,Medial Lower Leg o Topical Lidocaine 4% cream applied to wound bed prior to debridement Primary Wound Dressing Wound #1 Right,Lateral Foot o Santyl Ointment Wound #2 Right,Medial Lower Leg o Other: - keep clean and dry Secondary Dressing Wound #2 Right,Medial Lower Leg o Boardered Foam Dressing Dressing Change Frequency Wound #1 Right,Lateral Foot o Change dressing every day. Follow-up Appointments Wound #1 Right,Lateral Foot o Return Appointment in 1 week. Wound #2 Right,Medial Lower Leg o Return Appointment in 1 week. LANGDON, CROSSON ([id number]) Edema Control Wound #1  Right,Lateral Foot o Elevate legs to the level of the heart and pump ankles as often as possible o Other: - use compression stockings as prescribed Wound #2 Right,Medial Lower Leg o Elevate legs to the level of the heart and pump ankles as often as possible o Other: - use compression stockings as prescribed Electronic Signature(s) Signed: 01/04/2016 4:49:11 PM By: Evlyn Kanner MD, FACS Signed: 01/04/2016 5:14:45 PM By: Alejandro Mulling Entered By: Alejandro Mulling on 01/04/2016 08:46:11 Dennis Fitzgerald ([id number]) -------------------------------------------------------------------------------- Problem List Details Patient Name: Dennis Fitzgerald Date of Service: 01/04/2016 8:00 AM Medical Record Number: [id number] Patient Account Number: [id number] Date of Birth/Sex: 1956-05-20 (60 y.o. 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 I89.0 Lymphedema, not elsewhere classified 12/28/2015 Yes I73.9 Peripheral vascular disease, unspecified 12/28/2015 Yes F17.218 Nicotine dependence, cigarettes, with other nicotine- 12/28/2015 Yes induced disorders E66.01 Morbid (severe) obesity due to excess calories 12/28/2015 Yes Inactive Problems Resolved Problems Electronic Signature(s) Signed: 01/04/2016 8:44:55 AM By: Evlyn Kanner MD, FACS Entered By: Evlyn Kanner on 01/04/2016 08:44:55 Dennis Fitzgerald ([id number]) -------------------------------------------------------------------------------- Progress Note Details Patient Name: Dennis Fitzgerald Date of Service: 01/04/2016 8:00 AM Medical Record Number: [id number] Patient Account Number: [id number] Date of Birth/Sex: 06-May-1956 (60 y.o. Male) Treating RN: Phillis Haggis Primary Care Physician: Darreld Mclean Other Clinician: Referring Physician: Darreld Mclean Treating Physician/Extender: Rudene Re in Treatment: 1 Subjective Chief Complaint Information obtained from Patient Patients presents for treatment of an open diabetic ulcer to his right lateral foot for about 3 weeks now. History of Present Illness (HPI) The following HPI elements were documented for the patient's wound: Location: right lateral foot noted to have a ulcerated area with purulent drainage Quality: Patient reports No Pain. Severity: Patient states wound are getting worse. Duration: Patient has had the wound for < 4 weeks prior to presenting for treatment Context: The wound appeared gradually over time Modifying Factors: Other treatment(s) tried include: he was put on a course of Augmentin by his PCP Associated Signs and Symptoms: Patient reports having increase discharge. A 60 year old diabetic patient who was noted by his PCP, Dr. Darreld Mclean to have a ulceration  on the right foot for about 3 weeks now. Patient's past medical history significant for diabetes mellitus with the last hemoglobin A1c being 7.4 in May 2016. Also known to have hypertension, hyperlipidemia, peripheral vascular disease for which he is being followed up at the [facility] vascular surgery department and has had a vascular procedures done. review of his vascular notes from [facility] revealed that he is had a left lower extremity vein bypass graft in August 2014 with a favorable graft study. he had a left common femoral artery to below-knee popliteal bypass graft with reversed great saphenous vein and a left common femoral artery endarterectomy and patch angioplasty.He also has bilateral lower extremity venous insufficiency, lymphedema. he was asked to wear compression garments for his bilateral lymphedema. last arterial duplex study done in May 2016 showed left lower extremity patent graft with no elevated velocities and toe pressure was 76 mmHg. He has been a smoker for several years and at present continues to smoke. He was referred to [place] for evaluation and treatment of his diabetic foot ulcer on the right and  was prescribed a 10 day course of Augmentin recently. 01/04/2016 -- patient was seen by the vascular team at [facility] and the assessment and plan was that of bilateral peripheral arterial disease status post left lower extremity vein bypass graft with favorable scan done on 01/01/2016. The right lower extremity peripheral arterial disease with SFA stenosis 50-75% GTP 38 strong DP and PT signals. Should be able to heal wound but will have follow-up with the vascular surgeon in 2 weeks to discuss need for surgical debridement and revascularization. -- x-ray of the right foot was done on 12/28/2015 and it shows soft tissue wound along the lateral aspect of the foot with no bony abnormality except for diffuse degenerative changes. EMRE, STOCK ([id number]) Objective Constitutional Pulse  regular. Respirations normal and unlabored. Afebrile. Vitals Time Taken: 8:12 AM, Height: 71 in, Weight: 267 lbs, BMI: 37.2, Temperature: 98.1 F, Pulse: 85 bpm, Respiratory Rate: 18 breaths/min, Blood Pressure: 140/76 mmHg. Eyes Nonicteric. Reactive to light. Ears, Nose, Mouth, and Throat Lips, teeth, and gums WNL.Marland Kitchen Moist mucosa without lesions. Neck supple and nontender. No palpable supraclavicular or cervical adenopathy. Normal sized without goiter. Respiratory WNL. No retractions.. Cardiovascular Pedal Pulses WNL. No clubbing, cyanosis or edema. Lymphatic No adneopathy. No adenopathy. No adenopathy. Musculoskeletal Adexa without tenderness or enlargement.. Digits and nails w/o clubbing, cyanosis, infection, petechiae, ischemia, or inflammatory conditions.Marland Kitchen Psychiatric Judgement and insight Intact.. No evidence of depression, anxiety, or agitation.. General Notes: he still continues to have a lot of necrotic debris in the right lateral foot and I have sharply debrided with the forceps and scissors, down to the subcutaneous tissue.. Integumentary (Hair, Skin) No suspicious lesions. No crepitus or fluctuance. No peri-wound warmth or erythema. No masses.. Wound #1 status is Open. Original cause of wound was Gradually Appeared. The wound is located on the Right,Lateral Foot. The wound measures 2.4cm length x 4cm width x 0.3cm depth; 7.54cm^2 area and 2.262cm^3 volume. The wound is limited to skin breakdown. There is no tunneling or undermining noted. There is a medium amount of serous drainage noted. The wound margin is flat and intact. There is no granulation within the wound bed. There is a large (67-100%) amount of necrotic tissue within the wound bed including Eschar and Adherent Slough. The periwound skin appearance exhibited: Localized Edema, Barnhart, Caylor ([id number]) Erythema. The periwound skin appearance did not exhibit: Callus, Crepitus, Excoriation, Fluctuance, Friable,  Induration, Rash, Scarring, Dry/Scaly, Maceration, Moist, Atrophie Blanche, Cyanosis, Ecchymosis, Hemosiderin Staining, Mottled, Pallor, Rubor. The surrounding wound skin color is noted with erythema which is circumferential. Periwound temperature was noted as No Abnormality. The periwound has tenderness on palpation. Wound #2 status is Open. Original cause of wound was Gradually Appeared. The wound is located on the Right,Medial Lower Leg. The wound measures 0.2cm length x 5.2cm width x 0.1cm depth; 0.817cm^2 area and 0.082cm^3 volume. The wound is limited to skin breakdown. There is no tunneling or undermining noted. There is a none present amount of drainage noted. The wound margin is flat and intact. There is no granulation within the wound bed. There is a large (67-100%) amount of necrotic tissue within the wound bed including Eschar. The periwound skin appearance did not exhibit: Callus, Crepitus, Excoriation, Fluctuance, Friable, Induration, Localized Edema, Rash, Scarring, Dry/Scaly, Maceration, Moist, Atrophie Blanche, Cyanosis, Ecchymosis, Hemosiderin Staining, Mottled, Pallor, Rubor, Erythema. Periwound temperature was noted as No Abnormality. Assessment Active Problems ICD-10 E11.621 - Type 2 diabetes mellitus with foot ulcer L97.512 - Non-pressure chronic ulcer of other part of right foot  with fat layer exposed I89.0 - Lymphedema, not elsewhere classified I73.9 - Peripheral vascular disease, unspecified F17.218 - Nicotine dependence, cigarettes, with other nicotine-induced disorders E66.01 - Morbid (severe) obesity due to excess calories Procedures Wound #1 Wound #1 is a Diabetic Wound/Ulcer of the Lower Extremity located on the Right,Lateral Foot . There was a Skin/Subcutaneous Tissue Debridement ([id number]) debridement with total area of 9.6 sq cm performed by Evlyn KannerBritto, Jahki Witham, MD. with the following instrument(s): Forceps and Scissors to remove Viable and Non- Viable  tissue/material including Fibrin/Slough and Subcutaneous after achieving pain control using Other (lidocaine 4% cream). A time out was conducted prior to the start of the procedure. A Minimum amount of bleeding was controlled with N/A. The procedure was tolerated well with a pain level of 0 throughout and a pain level of 0 following the procedure. Post Debridement Measurements: 2.4cm length x 4cm width x 0.4cm depth; 3.016cm^3 volume. Post procedure Diagnosis Wound #1: Same as Pre-Procedure Rosamond, Ismeal ([id number]) Plan Wound Cleansing: Wound #1 Right,Lateral Foot: Clean wound with Normal Saline. Wound #2 Right,Medial Lower Leg: Clean wound with Normal Saline. Anesthetic: Wound #1 Right,Lateral Foot: Topical Lidocaine 4% cream applied to wound bed prior to debridement Wound #2 Right,Medial Lower Leg: Topical Lidocaine 4% cream applied to wound bed prior to debridement Primary Wound Dressing: Wound #1 Right,Lateral Foot: Santyl Ointment Wound #2 Right,Medial Lower Leg: Other: - keep clean and dry Secondary Dressing: Wound #2 Right,Medial Lower Leg: Boardered Foam Dressing Dressing Change Frequency: Wound #1 Right,Lateral Foot: Change dressing every day. Follow-up Appointments: Wound #1 Right,Lateral Foot: Return Appointment in 1 week. Wound #2 Right,Medial Lower Leg: Return Appointment in 1 week. Edema Control: Wound #1 Right,Lateral Foot: Elevate legs to the level of the heart and pump ankles as often as possible Other: - use compression stockings as prescribed Wound #2 Right,Medial Lower Leg: Elevate legs to the level of the heart and pump ankles as often as possible Other: - use compression stockings as prescribed I have recommended: 1. Santyl ointment daily to be applied and the wound dressed 2. Continue wearing his lymphedema pumps bilaterally as [facility] [place] [place] ([id number]) 3. he has had his arterial duplex study of his right  lower extremity, and I have discussed this result with him. 4. Continue his antibiotics as prescribed by his PCP and no cultures taken today 5. X-ray of the right foot 3 views to look for osteomyelitis -- results reviewed with the patient. 6. Completely give up smoking. I have spent about 5 minutes discussing with him the risks benefits and alternatives of smoking and the need to do this immediately. He says he will be compliant. Electronic Signature(s) Signed: 01/04/2016 8:47:47 AM By: Evlyn KannerBritto, Stina Gane MD, FACS Entered By: Evlyn KannerBritto, Teighlor Korson on 01/04/2016 08:47:47 Dennis ShadowBREWER, Mace ([id number]) -------------------------------------------------------------------------------- SuperBill Details Patient Name: Dennis ShadowBREWER, Bertin Date of Service: 01/04/2016 Medical Record Number: [id number] Patient Account Number: [id number] Date of Birth/Sex: March 20, 1956 [age] y.o. Male) Treating RN: Phillis HaggisPinkerton, Debi Primary Care Physician: Darreld McleanMILES, LINDA Other Clinician: Referring Physician: Darreld McleanMILES, LINDA Treating Physician/Extender: Rudene ReBritto, Celedonio Sortino Weeks in Treatment: 1 Diagnosis Coding ICD-10 Codes Code Description E11.621 Type 2 diabetes mellitus with foot ulcer L97.512 Non-pressure chronic ulcer of other part of right foot with fat layer exposed I89.0 Lymphedema, not elsewhere classified I73.9 Peripheral vascular disease, unspecified F17.218 Nicotine dependence, cigarettes, with other nicotine-induced disorders E66.01 Morbid (severe) obesity due to excess calories Facility Procedures CPT4 Code Description: [id number] 11042 - DEB SUBQ TISSUE 20 SQ CM/< ICD-10 Description Diagnosis E11.621  Type 2 diabetes mellitus with foot ulcer L97.512 Non-pressure chronic ulcer of other part of right fo I89.0 Lymphedema, not elsewhere classified Modifier: ot with fat la Quantity: 1 yer exposed Physician Procedures CPT4 Code Description: [id number] 99213 - WC PHYS LEVEL 3 - EST PT ICD-10 Description Diagnosis E11.621 Type 2 diabetes mellitus with  foot ulcer L97.512 Non-pressure chronic ulcer of other part of right fo I89.0 Lymphedema, not elsewhere classified I73.9  Peripheral vascular disease, unspecified Modifier: 25 ot with fat lay Quantity: 1 er exposed CPT4 Code Description: [id number] 11042 - WC PHYS SUBQ TISS 20 SQ CM ICD-10 Description Diagnosis E11.621 Type 2 diabetes mellitus with foot ulcer L97.512 Non-pressure chronic ulcer of other part of right fo I89.0 Lymphedema, not elsewhere classified  LOUI, [place] ([id number]) Modifier: ot with fat lay Quantity: 1 er exposed Electronic Signature(s) Signed: 01/04/2016 8:48:14 AM By: Evlyn Kanner MD, FACS Entered By: Evlyn Kanner on 01/04/2016 08:48:14

## 2016-01-05 NOTE — Progress Notes (Signed)
Dennis Fitzgerald, Dennis Fitzgerald ([id number]) Visit Report for 01/04/2016 Arrival Information Details Patient Name: Dennis Fitzgerald Date of Service: 01/04/2016 8:00 AM Medical Record Number: [id number] Patient Account Number: [id number] Date of Birth/Sex: [birth year] (60 y.o. Male) Treating RN: Dennis Fitzgerald Primary Care Physician: Dennis Fitzgerald Other Clinician: Referring Physician: Darreld Fitzgerald Treating Physician/Extender: Dennis Fitzgerald in Treatment: 1 Visit Information History Since Last Visit All ordered tests and consults were completed: No Patient Arrived: Ambulatory Added or deleted any medications: No Arrival Time: 08:11 Any new allergies or adverse reactions: No Accompanied By: self Had a fall or experienced change in No Transfer Assistance: None activities of daily living that may affect Patient Identification Verified: Yes risk of falls: Secondary Verification Process Yes Signs or symptoms of abuse/neglect since last No Completed: visito Patient Requires Transmission-Based No Hospitalized since last visit: No Precautions: Pain Present Now: Yes Patient Has Alerts: Yes Patient Alerts: DMII aspirin 81 Electronic Signature(s) Signed: 01/04/2016 5:14:45 PM By: Dennis Fitzgerald Entered By: Dennis Fitzgerald on 01/04/2016 08:11:23 Dennis Fitzgerald ([id number]) -------------------------------------------------------------------------------- Encounter Discharge Information Details Patient Name: Dennis Fitzgerald Date of Service: 01/04/2016 8:00 AM Medical Record Number: [id number] Patient Account Number: [id number] Date of Birth/Sex: 23-Apr-1956 (60 y.o. Male) Treating RN: Dennis Fitzgerald Primary Care Physician: Dennis Fitzgerald Other Clinician: Referring Physician: Darreld Fitzgerald Treating Physician/Extender: Dennis Fitzgerald in Treatment: 1 Encounter Discharge Information Items Discharge Pain Level: 0 Discharge Condition: Stable Ambulatory Status: Ambulatory Discharge Destination:  Home Transportation: Private Auto Accompanied By: self Schedule Follow-up Appointment: Yes Medication Reconciliation completed and provided to Patient/Care Yes Dennis Fitzgerald: Provided on Clinical Summary of Care: 01/04/2016 Form Type Recipient Paper Patient BB Electronic Signature(s) Signed: 01/04/2016 9:01:26 AM By: Dennis Fitzgerald Entered By: Dennis Fitzgerald on 01/04/2016 09:01:26 Dennis Fitzgerald ([id number]) -------------------------------------------------------------------------------- Lower Extremity Assessment Details Patient Name: Dennis Fitzgerald Date of Service: 01/04/2016 8:00 AM Medical Record Number: [id number] Patient Account Number: [id number] Date of Birth/Sex: 1956/05/15 (60 y.o. Male) Treating RN: Dennis Fitzgerald Primary Care Physician: Dennis Fitzgerald Other Clinician: Referring Physician: Darreld Fitzgerald Treating Physician/Extender: Dennis Fitzgerald in Treatment: 1 Edema Assessment Assessed: [Left: No] [Right: No] Edema: [Left: Ye] [Right: s] Calf Left: Right: Point of Measurement: cm From Medial Instep cm 52 cm Ankle Left: Right: Point of Measurement: cm From Medial Instep cm 32.8 cm Vascular Assessment Pulses: Posterior Tibial Dorsalis Pedis Palpable: [Right:No] Doppler: [Right:Monophasic] Extremity colors, hair growth, and conditions: Extremity Color: [Right:Red] Hair Growth on Extremity: [Right:No] Temperature of Extremity: [Right:Warm] Capillary Refill: [Right:< 3 seconds] Toe Nail Assessment Left: Right: Thick: Yes Discolored: Yes Deformed: Yes Improper Length and Hygiene: Yes Electronic Signature(s) Signed: 01/04/2016 5:14:45 PM By: Dennis Fitzgerald Entered By: Dennis Fitzgerald on 01/04/2016 08:23:44 Dennis Fitzgerald ([id number]) Dennis Fitzgerald ([id number]) -------------------------------------------------------------------------------- Multi Wound Chart Details Patient Name: Dennis Fitzgerald Date of Service: 01/04/2016 8:00 AM Medical Record Number:  [id number] Patient Account Number: [id number] Date of Birth/Sex: July 23, 1956 (60 y.o. 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] [N/A:N/A] Comorbid History: [1:Chronic Obstructive Pulmonary Disease (COPD), Hypertension, Peripheral Venous Disease, Type II Diabetes, Disease, Type II Diabetes, Neuropathy] [2:Chronic Obstructive Pulmonary Disease (COPD), Hypertension, Peripheral Venous  Neuropathy] [  N/A:N/A] Date Acquired: [1:12/13/2015] [2:12/21/2015] [N/A:N/A] Weeks of Treatment: [1:1] [2:1] [N/A:N/A] Wound Status: [1:Open] [2:Open] [N/A:N/A] Pending Amputation on Yes [2:No] [N/A:N/A] Presentation: Measurements L x W x D 2.4x4x0.3 [2:0.2x5.2x0.1] [N/A:N/A] (cm) Area (cm) : [1:7.54] [2:0.817] [N/A:N/A] Volume (cm) : [1:2.262] [2:0.082] [N/A:N/A] % Reduction in Area: [1:15.30%] [2:-131.40%] [N/A:N/A] % Reduction in Volume: 15.30% [2:-134.30%] [N/A:N/A] Classification: [1:Grade 1] [2:Partial Thickness] [N/A:N/A] HBO Classification: [1:N/A] [2:Grade 1] [N/A:N/A] Exudate Amount: [1:Medium] [2:None Present] [N/A:N/A] Exudate Type: [1:Serous] [2:N/A] [N/A:N/A] Exudate Color: [1:amber] [2:N/A] [N/A:N/A] Foul Odor After [1:Yes] [2:No] [N/A:N/A] Cleansing: [1:No] [2:N/A] [N/A:N/A] Odor Anticipated Due to Product Use: Wound Margin: Flat and Intact Flat and Intact N/A Granulation Amount: None Present (0%) None  Present (0%) N/A Necrotic Amount: Large (67-100%) Large (67-100%) N/A Necrotic Tissue: Eschar, Adherent Slough Eschar N/A Exposed Structures: Fascia: No Fascia: No N/A Fat: No Fat: No Tendon: No Tendon: No Muscle: No Muscle: No Joint: No Joint: No Bone: No Bone: No Limited to Skin Limited to Skin Breakdown Breakdown Epithelialization: None Small (1-33%) N/A Periwound Skin Texture: Edema: Yes Edema: No N/A Excoriation: No Excoriation: No Induration: No Induration: No Callus: No Callus: No Crepitus: No Crepitus: No Fluctuance: No Fluctuance: No Friable: No Friable: No Rash: No Rash: No Scarring: No Scarring: No Periwound Skin Maceration: No Maceration: No N/A Moisture: Moist: No Moist: No Dry/Scaly: No Dry/Scaly: No Periwound Skin Color: Erythema: Yes Atrophie Blanche: No N/A Atrophie Blanche: No Cyanosis: No Cyanosis: No Ecchymosis: No Ecchymosis: No Erythema: No Hemosiderin Staining: No Hemosiderin Staining: No Mottled: No Mottled: No Pallor: No Pallor: No Rubor: No Rubor: No Erythema Location: Circumferential N/A N/A Temperature: No Abnormality No Abnormality N/A Tenderness on Yes No N/A Palpation: Wound Preparation: Ulcer Cleansing: Ulcer Cleansing: N/A Rinsed/Irrigated with Rinsed/Irrigated with Saline Saline Topical Anesthetic Topical Anesthetic Applied: Other: lidocaine Applied: Other: lidocaine 4% 4% Treatment Notes LILIANA, BRENTLINGER ([id number]) Electronic Signature(s) Signed: 01/04/2016 5:14:45 PM By: Dennis Fitzgerald Entered By: Dennis Fitzgerald on 01/04/2016 08:29:27 Dennis Fitzgerald ([id number]) -------------------------------------------------------------------------------- Multi-Disciplinary Care Plan Details Patient Name: Dennis Fitzgerald Date of Service: 01/04/2016 8:00 AM Medical Record Number: [id number] Patient Account Number: [id number] Date of Birth/Sex: 11/28/1956 (60 y.o. Male) Treating RN: Dennis Fitzgerald Primary Care  Physician: Dennis Fitzgerald Other Clinician: Referring Physician: Darreld Fitzgerald Treating Physician/Extender: Dennis Fitzgerald in Treatment: 1 Active Inactive Abuse / Safety / Falls / Self Care Management Nursing Diagnoses: Impaired physical mobility Potential for falls Goals: Patient will remain injury free Date Initiated: 12/28/2015 Goal Status: Active Interventions: Assess fall risk on admission and as needed Notes: Orientation to the Wound Care Program Nursing Diagnoses: Knowledge deficit related to the wound healing center program Goals: Patient/caregiver will verbalize understanding of the Wound Healing Center Program Date Initiated: 12/28/2015 Goal Status: Active Interventions: Provide education on orientation to the wound center Notes: Wound/Skin Impairment Nursing Diagnoses: Impaired tissue integrity Goals: Patient/caregiver will verbalize understanding of skin care regimen BREVON, DEWALD ([id number]) Date Initiated: 12/28/2015 Goal Status: Active Ulcer/skin breakdown will have a volume reduction of 30% by week 4 Date Initiated: 12/28/2015 Goal Status: Active Ulcer/skin breakdown will have a volume reduction of 50% by week 8 Date Initiated: 12/28/2015 Goal Status: Active Ulcer/skin breakdown will have a volume reduction of 80% by week 12 Date Initiated: 12/28/2015 Goal Status: Active Ulcer/skin breakdown will heal within 14 weeks Date Initiated: 12/28/2015 Goal Status: Active Interventions: Assess patient/caregiver ability to obtain necessary supplies Assess patient/caregiver ability to perform ulcer/skin care regimen upon admission and as needed Assess ulceration(s) every visit Notes: Electronic Signature(s) Signed: 01/04/2016  5:14:45 PM By: Dennis Fitzgerald Entered By: Dennis Fitzgerald on 01/04/2016 08:29:19 Dennis Fitzgerald ([id number]) -------------------------------------------------------------------------------- Pain Assessment Details Patient Name: Dennis Fitzgerald Date of Service: 01/04/2016 8:00 AM Medical Record Number: [id number] Patient Account Number: [id number] Date of Birth/Sex: [birth year] (60 y.o. Male) Treating RN: Dennis Fitzgerald Primary Care Physician: Dennis Fitzgerald Other Clinician: Referring Physician: Darreld Fitzgerald Treating Physician/Extender: Dennis Fitzgerald in Treatment: 1 Active Problems Location of Pain Severity and Description of Pain Patient Has Paino Yes Site Locations Pain Location: Pain in Ulcers Duration of the Pain. Constant / Intermittento Constant Rate the pain. Current Pain Level: 8 Pain Management and Medication Current Pain Management: Electronic Signature(s) Signed: 01/04/2016 5:14:45 PM By: Dennis Fitzgerald Entered By: Dennis Fitzgerald on 01/04/2016 08:11:46 Dennis Fitzgerald ([id number]) -------------------------------------------------------------------------------- Patient/Caregiver Education Details Patient Name: Dennis Fitzgerald Date of Service: 01/04/2016 8:00 AM Medical Record Number: [id number] Patient Account Number: [id number] Date of Birth/Gender: January 22, 1956 (60 y.o. Male) Treating RN: Dennis Fitzgerald Primary Care Physician: Dennis Fitzgerald Other Clinician: Referring Physician: Darreld Fitzgerald Treating Physician/Extender: Dennis Fitzgerald in Treatment: 1 Education Assessment Education Provided To: Patient Education Topics Provided Wound/Skin Impairment: Handouts: Other: change dressing as ordered and stop smoking Methods: Demonstration, Explain/Verbal Responses: State content correctly Electronic Signature(s) Signed: 01/04/2016 5:14:45 PM By: Dennis Fitzgerald Entered By: Dennis Fitzgerald on 01/04/2016 08:48:05 Dennis Fitzgerald ([id number]) -------------------------------------------------------------------------------- Wound Assessment Details Patient Name: Dennis Fitzgerald Date of Service: 01/04/2016 8:00 AM Medical Record Number: [id number] Patient Account Number: [id number] Date of Birth/Sex:  [birth year] (60 y.o. Male) Treating RN: Dennis Fitzgerald Primary Care Physician: Dennis Fitzgerald Other Clinician: Referring Physician: Darreld Fitzgerald Treating Physician/Extender: Dennis Fitzgerald in Treatment: 1 Wound Status Wound Number: 1 Primary Diabetic Wound/Ulcer of the Lower Etiology: Extremity Wound Location: Right Foot - Lateral Wound Open Wounding Event: Gradually Appeared Status: Date Acquired: 12/13/2015 Comorbid Chronic Obstructive Pulmonary Disease Weeks Of Treatment: 1 History: (COPD), Hypertension, Peripheral Clustered Wound: No Venous Disease, Type II Diabetes, Pending Amputation On Presentation Neuropathy Photos Photo Uploaded By: Dennis Fitzgerald on 01/04/2016 16:44:03 Wound Measurements Length: (cm) 2.4 Width: (cm) 4 Depth: (cm) 0.3 Area: (cm) 7.54 Volume: (cm) 2.262 % Reduction in Area: 15.3% % Reduction in Volume: 15.3% Epithelialization: None Tunneling: No Undermining: No Wound Description Classification: Grade 1 Wound Margin: Flat and Intact Exudate Amount: Medium Exudate Type: Serous Exudate Color: amber Foul Odor After Cleansing: Yes Due to Product Use: No Wound Bed Granulation Amount: None Present (0%) Exposed Structure Necrotic Amount: Large (67-100%) Fascia Exposed: No Necrotic Quality: Eschar, Adherent Slough Fat Layer Exposed: No Mcevers, Adisa ([id number]) Tendon Exposed: No Muscle Exposed: No Joint Exposed: No Bone Exposed: No Limited to Skin Breakdown Periwound Skin Texture Texture Color No Abnormalities Noted: No No Abnormalities Noted: No Callus: No Atrophie Blanche: No Crepitus: No Cyanosis: No Excoriation: No Ecchymosis: No Fluctuance: No Erythema: Yes Friable: No Erythema Location: Circumferential Induration: No Hemosiderin Staining: No Localized Edema: Yes Mottled: No Rash: No Pallor: No Scarring: No Rubor: No Moisture Temperature / Pain No Abnormalities Noted: No Temperature: No Abnormality Dry /  Scaly: No Tenderness on Palpation: Yes Maceration: No Moist: No Wound Preparation Ulcer Cleansing: Rinsed/Irrigated with Saline Topical Anesthetic Applied: Other: lidocaine 4%, Treatment Notes Wound #1 (Right, Lateral Foot) 1. Cleansed with: Clean wound with Normal Saline 2. Anesthetic Topical Lidocaine 4% cream to wound bed prior to debridement 3. Peri-wound Care: Skin Prep 4. Dressing Applied: Santyl Ointment 5. Secondary Dressing Applied Bordered Foam Dressing Electronic Signature(s) Signed: 01/04/2016 5:14:45 PM By: Dennis Fitzgerald Entered By: Dennis Fitzgerald on 01/04/2016  08:27:09 MIKELL, KAZLAUSKAS ([id number]) -------------------------------------------------------------------------------- Wound Assessment Details Patient Name: BENNETT, RAM Date of Service: 01/04/2016 8:00 AM Medical Record Number: [id number] Patient Account Number: [id number] Date of Birth/Sex: November 04, 1956 (60 y.o. Male) Treating RN: Dennis Fitzgerald Primary Care Physician: Dennis Fitzgerald Other Clinician: Referring Physician: Darreld Fitzgerald Treating Physician/Extender: Dennis Fitzgerald in Treatment: 1 Wound Status Wound Number: 2 Primary Lymphedema Etiology: Wound Location: Right Lower Leg - Medial Wound Open Wounding Event: Gradually Appeared Status: Date Acquired: 12/21/2015 Comorbid Chronic Obstructive Pulmonary Disease Weeks Of Treatment: 1 History: (COPD), Hypertension, Peripheral Clustered Wound: No Venous Disease, Type II Diabetes, Neuropathy Photos Photo Uploaded By: Dennis Fitzgerald on 01/04/2016 16:44:17 Wound Measurements Length: (cm) 0.2 Width: (cm) 5.2 Depth: (cm) 0.1 Area: (cm) 0.817 Volume: (cm) 0.082 % Reduction in Area: -131.4% % Reduction in Volume: -134.3% Epithelialization: Small (1-33%) Tunneling: No Undermining: No Wound Description Classification: Partial Thickness Foul Odor A Diabetic Severity (Wagner): Grade 1 Wound Margin: Flat and Intact Exudate Amount:  None Present fter Cleansing: No Wound Bed Granulation Amount: None Present (0%) Exposed Structure Necrotic Amount: Large (67-100%) Fascia Exposed: No Necrotic Quality: Eschar Fat Layer Exposed: No Tendon Exposed: No Silvestri, Jackston ([id number]) Muscle Exposed: No Joint Exposed: No Bone Exposed: No Limited to Skin Breakdown Periwound Skin Texture Texture Color No Abnormalities Noted: No No Abnormalities Noted: No Callus: No Atrophie Blanche: No Crepitus: No Cyanosis: No Excoriation: No Ecchymosis: No Fluctuance: No Erythema: No Friable: No Hemosiderin Staining: No Induration: No Mottled: No Localized Edema: No Pallor: No Rash: No Rubor: No Scarring: No Temperature / Pain Moisture Temperature: No Abnormality No Abnormalities Noted: No Dry / Scaly: No Maceration: No Moist: No Wound Preparation Ulcer Cleansing: Rinsed/Irrigated with Saline Topical Anesthetic Applied: Other: lidocaine 4%, Treatment Notes Wound #2 (Right, Medial Lower Leg) 1. Cleansed with: Clean wound with Normal Saline 2. Anesthetic Topical Lidocaine 4% cream to wound bed prior to debridement Electronic Signature(s) Signed: 01/04/2016 5:14:45 PM By: Dennis Fitzgerald Entered By: Dennis Fitzgerald on 01/04/2016 08:25:30 Dennis Fitzgerald ([id number]) -------------------------------------------------------------------------------- Vitals Details Patient Name: Dennis Fitzgerald Date of Service: 01/04/2016 8:00 AM Medical Record Number: [id number] Patient Account Number: [id number] Date of Birth/Sex: 04-05-1956 (60 y.o. Male) Treating RN: Dennis Fitzgerald Primary Care Physician: Dennis Fitzgerald Other Clinician: Referring Physician: Darreld Fitzgerald Treating Physician/Extender: Dennis Fitzgerald in Treatment: 1 Vital Signs Time Taken: 08:12 Temperature (F): 98.1 Height (in): 71 Pulse (bpm): 85 Weight (lbs): 267 Respiratory Rate (breaths/min): 18 Body Mass Index (BMI): 37.2 Blood Pressure (mmHg):  140/76 Reference Range: 80 - 120 mg / dl Electronic Signature(s) Signed: 01/04/2016 5:14:45 PM By: Dennis Fitzgerald Entered By: Dennis Fitzgerald on 01/04/2016 08:13:37

## 2016-01-11 ENCOUNTER — Ambulatory Visit: Payer: Medicaid Other | Admitting: Surgery

## 2016-01-12 ENCOUNTER — Encounter: Payer: Medicaid Other | Admitting: Surgery

## 2016-01-12 ENCOUNTER — Encounter: Payer: Self-pay | Admitting: Emergency Medicine

## 2016-01-12 ENCOUNTER — Emergency Department: Payer: Medicaid Other

## 2016-01-12 ENCOUNTER — Emergency Department
Admission: EM | Admit: 2016-01-12 | Discharge: 2016-01-12 | Discharge status: Against Medical Advice | Payer: Medicaid Other | Attending: Emergency Medicine | Admitting: Emergency Medicine

## 2016-01-12 DIAGNOSIS — Z7984 Long term (current) use of oral hypoglycemic drugs: Secondary | ICD-10-CM | POA: Insufficient documentation

## 2016-01-12 DIAGNOSIS — Z79899 Other long term (current) drug therapy: Secondary | ICD-10-CM | POA: Insufficient documentation

## 2016-01-12 DIAGNOSIS — Z794 Long term (current) use of insulin: Secondary | ICD-10-CM | POA: Diagnosis not present

## 2016-01-12 DIAGNOSIS — R000 Tachycardia, unspecified: Secondary | ICD-10-CM | POA: Insufficient documentation

## 2016-01-12 DIAGNOSIS — Z7951 Long term (current) use of inhaled steroids: Secondary | ICD-10-CM | POA: Diagnosis not present

## 2016-01-12 DIAGNOSIS — F17210 Nicotine dependence, cigarettes, uncomplicated: Secondary | ICD-10-CM | POA: Diagnosis not present

## 2016-01-12 DIAGNOSIS — Z7902 Long term (current) use of antithrombotics/antiplatelets: Secondary | ICD-10-CM | POA: Diagnosis not present

## 2016-01-12 DIAGNOSIS — E11621 Type 2 diabetes mellitus with foot ulcer: Secondary | ICD-10-CM | POA: Insufficient documentation

## 2016-01-12 DIAGNOSIS — Z7982 Long term (current) use of aspirin: Secondary | ICD-10-CM | POA: Insufficient documentation

## 2016-01-12 DIAGNOSIS — G629 Polyneuropathy, unspecified: Secondary | ICD-10-CM | POA: Insufficient documentation

## 2016-01-12 DIAGNOSIS — M79671 Pain in right foot: Secondary | ICD-10-CM | POA: Diagnosis present

## 2016-01-12 DIAGNOSIS — R5381 Other malaise: Secondary | ICD-10-CM | POA: Diagnosis not present

## 2016-01-12 DIAGNOSIS — L97519 Non-pressure chronic ulcer of other part of right foot with unspecified severity: Secondary | ICD-10-CM | POA: Insufficient documentation

## 2016-01-12 HISTORY — DX: Lymphedema, not elsewhere classified: I89.0

## 2016-01-12 HISTORY — DX: Type 2 diabetes mellitus without complications: E11.9

## 2016-01-12 LAB — CBC WITH DIFFERENTIAL/PLATELET
Basophils Absolute: 0.1 10*3/uL (ref 0–0.1)
Basophils Relative: 0 %
Eosinophils Absolute: 0.2 10*3/uL (ref 0–0.7)
Eosinophils Relative: 1 %
HCT: 42 % (ref 40.0–52.0)
Hemoglobin: 14.1 g/dL (ref 13.0–18.0)
Lymphocytes Relative: 7 %
Lymphs Abs: 0.9 10*3/uL — ABNORMAL LOW (ref 1.0–3.6)
MCH: 28.1 pg (ref 26.0–34.0)
MCHC: 33.6 g/dL (ref 32.0–36.0)
MCV: 83.5 fL (ref 80.0–100.0)
Monocytes Absolute: 0.8 10*3/uL (ref 0.2–1.0)
Monocytes Relative: 6 %
Neutro Abs: 11.7 10*3/uL — ABNORMAL HIGH (ref 1.4–6.5)
Neutrophils Relative %: 86 %
Platelets: 235 10*3/uL (ref 150–440)
RBC: 5.03 MIL/uL (ref 4.40–5.90)
RDW: 14.8 % — ABNORMAL HIGH (ref 11.5–14.5)
WBC: 13.6 10*3/uL — ABNORMAL HIGH (ref 3.8–10.6)

## 2016-01-12 LAB — BASIC METABOLIC PANEL
ANION GAP: 9 (ref 5–15)
BUN: 9 mg/dL (ref 6–20)
CO2: 25 mmol/L (ref 22–32)
Calcium: 8.5 mg/dL — ABNORMAL LOW (ref 8.9–10.3)
Chloride: 94 mmol/L — ABNORMAL LOW (ref 101–111)
Creatinine, Ser: 0.67 mg/dL (ref 0.61–1.24)
GFR calc non Af Amer: >60 mL/min (ref >60)
Glucose, Bld: 308 mg/dL — ABNORMAL HIGH (ref 65–99)
Potassium: 3.9 mmol/L (ref 3.5–5.1)
Sodium: 128 mmol/L — ABNORMAL LOW (ref 135–145)

## 2016-01-12 LAB — SEDIMENTATION RATE: Sed Rate: 40 mm/hr — ABNORMAL HIGH (ref 0–20)

## 2016-01-12 LAB — C-REACTIVE PROTEIN: CRP: 9.6 mg/dL — ABNORMAL HIGH (ref <1.0)

## 2016-01-12 MED ORDER — PIPERACILLIN-TAZOBACTAM 3.375 G IVPB
3.3750 g | Freq: Once | INTRAVENOUS | Status: AC
  Administered 2016-01-12: 3.375 g via INTRAVENOUS
  Filled 2016-01-12: qty 50

## 2016-01-12 MED ORDER — CIPROFLOXACIN HCL 500 MG PO TABS: 500.0000 mg | ORAL_TABLET | Freq: Two times a day (BID) | ORAL | Status: DC

## 2016-01-12 MED ORDER — CLINDAMYCIN HCL 150 MG PO CAPS: 450.0000 mg | ORAL_CAPSULE | Freq: Three times a day (TID) | ORAL | Status: DC

## 2016-01-12 MED ORDER — VANCOMYCIN HCL 10 G IV SOLR
1750.0000 mg | Freq: Once | INTRAVENOUS | Status: AC
  Administered 2016-01-12: 1750 mg via INTRAVENOUS
  Filled 2016-01-12: qty 1750

## 2016-01-12 MED ORDER — SODIUM CHLORIDE 0.9 % IV BOLUS (SEPSIS)
1000.0000 mL | Freq: Once | INTRAVENOUS | Status: AC
  Administered 2016-01-12: 1000 mL via INTRAVENOUS

## 2016-01-12 NOTE — ED Notes (Signed)
Pt to ed with c/o right foot pain and wound.  Pt states he has been seeing the foot clinic for the wound but was told he needs to see a vascular dr regarding his foot.  Pt states wound is getting worse and was told to come to ed for eval.

## 2016-01-12 NOTE — ED Notes (Signed)
Pt in hallway, unable to sign e sig.

## 2016-01-12 NOTE — ED Provider Notes (Signed)
[facility] Emergency Department Provider Note  ____________________________________________  Time seen: 12:20 PM  I have reviewed the triage vital signs and the nursing notes.   HISTORY  Chief Complaint Foot Pain    HPI Dennis Fitzgerald is a 60 y.o. male who complains of worsening pain and swelling around a right foot wound. He's had the wound for about 7 weeks. He initially saw his primary care doctor who put him on a course of antibiotics. He's been been following with the wound care center approximately once a week up until now but is noticed that the ulceration has become acutely worsened over the last week. He last went to wound care 1 or 2 weeks ago. Also reports decreased appetite and decreased oral intake. No fever or vomiting. No dizziness or syncope.   Patient reports compliance with his home medications and no changes in his needed insulin to control his blood sugars. Reports his blood sugars usually 100-140 at home. Patient recently had thrush for which she took nystatin mouth rinse.   Past Medical History  Diagnosis Date  . Diabetes mellitus without complication (HCC)   . Lymphedema      There are no active problems to display for this patient.    History reviewed. No pertinent past surgical history.   Current Outpatient Rx  Name  Route  Sig  Dispense  Refill  . albuterol (PROVENTIL HFA;VENTOLIN HFA) 108 (90 Base) MCG/ACT inhaler   Inhalation   Inhale 2 puffs into the lungs every 4 (four) hours as needed for wheezing or shortness of breath.         Marland Kitchen aspirin EC 81 MG tablet   Oral   Take 81 mg by mouth every evening.         Marland Kitchen atorvastatin (LIPITOR) 40 MG tablet   Oral   Take 40 mg by mouth every evening.         . clopidogrel (PLAVIX) 75 MG tablet   Oral   Take 75 mg by mouth every evening.         . collagenase (SANTYL) ointment   Topical   Apply 1 application topically 2 (two) times daily.         . furosemide  (LASIX) 40 MG tablet   Oral   Take 40 mg by mouth 2 (two) times daily.         . insulin NPH-regular Human (NOVOLIN 70/30) (70-30) 100 UNIT/ML injection   Subcutaneous   Inject 50-55 Units into the skin 2 (two) times daily. Pt uses 55 units in the morning and 50 at night.         . linagliptin (TRADJENTA) 5 MG TABS tablet   Oral   Take 5 mg by mouth every evening.          Marland Kitchen lisinopril (PRINIVIL,ZESTRIL) 10 MG tablet   Oral   Take 10 mg by mouth every evening.         . metFORMIN (GLUCOPHAGE) 1000 MG tablet   Oral   Take 1,000 mg by mouth 2 (two) times daily.         . potassium chloride (K-DUR,KLOR-CON) 10 MEQ tablet   Oral   Take 10 mEq by mouth every evening.         Marland Kitchen Umeclidinium-Vilanterol (ANORO ELLIPTA) 62.5-25 MCG/INH AEPB   Inhalation   Inhale 1 puff into the lungs every evening.             Allergies Review of patient's  allergies indicates no known allergies.   History reviewed. No pertinent family history.  Social History Social History  Substance Use Topics  . Smoking status: Current Every Day Smoker  . Smokeless tobacco: None  . Alcohol Use: No    Review of Systems  Constitutional:   No fever or chills. No weight changes. Positive malaise Eyes:   No blurry vision or double vision.  ENT:   No sore throat. Cardiovascular:   No chest pain. Respiratory:   No dyspnea or cough. Gastrointestinal:   Negative for abdominal pain, vomiting and diarrhea.  No BRBPR or melena. Genitourinary:   Negative for dysuria, urinary retention, bloody urine, or difficulty urinating. Musculoskeletal:   Negative for back pain. No joint swelling or pain. Skin:   Positive ulceration on right foot  Neurological:   Negative for headaches, focal weakness or numbness. Chronic neuropathy Psychiatric:  No anxiety or depression.   Endocrine:  No hot/cold intolerance, changes in energy, or sleep difficulty.  10-point ROS otherwise  negative.  ____________________________________________   PHYSICAL EXAM:  VITAL SIGNS: ED Triage Vitals  Enc Vitals Group     BP 01/12/16 1132 154/74 mmHg     Pulse Rate 01/12/16 1132 110     Resp 01/12/16 1132 20     Temp 01/12/16 1132 99.1 F (37.3 C)     Temp Source 01/12/16 1132 Oral     SpO2 01/12/16 1132 99 %     Weight 01/12/16 1132 250 lb (113.399 kg)     Height 01/12/16 1132  (1.803 m)     Head Cir --      Peak Flow --      Pain Score 01/12/16 1132 9     Pain Loc --      Pain Edu? --      Excl. in GC? --     Vital signs reviewed, nursing assessments reviewed.   Constitutional:   Alert and oriented. Well appearing and in no distress. Smells strongly of cigarette smoke Eyes:   No scleral icterus. No conjunctival pallor. PERRL. EOMI ENT   Head:   Normocephalic and atraumatic.   Nose:   No congestion/rhinnorhea. No septal hematoma   Mouth/Throat:   MMM, no pharyngeal erythema. No peritonsillar mass. No uvula shift.   Neck:   No stridor. No SubQ emphysema. No meningismus. Hematological/Lymphatic/Immunilogical:   No cervical lymphadenopathy. Cardiovascular:   Tachycardia heart rate 110. Normal and symmetric distal pulses are present in all extremities. No murmurs, rubs, or gallops. Respiratory:   Normal respiratory effort without tachypnea nor retractions. Breath sounds are clear and equal bilaterally. No wheezes/rales/rhonchi. Gastrointestinal:   Soft and nontender. No distention. There is no CVA tenderness.  No rebound, rigidity, or guarding. Genitourinary:   deferred Musculoskeletal:  Brawny edema bilateral lower extremities, symmetric. There is a large approximately 5 cm necrotic ulceration of the lateral aspect of the right foot along the fifth metatarsal. Unable to probe to the depth of the wound because it is filled with adherent necrotic tissue. The necrosis appears to undermine the intact skin edges distally. There is good capillary refill in  all the toes. There is some mild tenderness on the lateral plantar aspect of the foot near the ulceration. Neurologic:   Normal speech and language.  CN 2-10 normal. Motor grossly intact.  No gross focal neurologic deficits are appreciated.  Skin:    Skin is warm, dry and intact except for foot ulcer as above. No rash noted.  No petechiae, purpura,  or bullae. Psychiatric:   Mood and affect are normal. Speech and behavior are normal. Patient exhibits appropriate insight and judgment.  ____________________________________________    LABS (pertinent positives/negatives) (all labs ordered are listed, but only abnormal results are displayed) Labs Reviewed  BASIC METABOLIC PANEL - Abnormal; Notable for the following:    Sodium 128 (*)    Chloride 94 (*)    Glucose, Bld 308 (*)    Calcium 8.5 (*)    All other components within normal limits  CBC WITH DIFFERENTIAL/PLATELET - Abnormal; Notable for the following:    WBC 13.6 (*)    RDW 14.8 (*)    Neutro Abs 11.7 (*)    Lymphs Abs 0.9 (*)    All other components within normal limits  CULTURE, BLOOD (ROUTINE X 2)  CULTURE, BLOOD (ROUTINE X 2)  SEDIMENTATION RATE  C-REACTIVE PROTEIN   ____________________________________________   EKG    ____________________________________________    RADIOLOGY  X-ray right foot does not show any evidence of osteomyelitis  ____________________________________________   PROCEDURES CRITICAL CARE Performed by: Scotty Court, Audrea Bolte   Total critical care time: 35 minutes  Critical care time was exclusive of separately billable procedures and treating other patients.  Critical care was necessary to treat or prevent imminent or life-threatening deterioration.  Critical care was time spent personally by me on the following activities: development of treatment plan with patient and/or surrogate as well as nursing, discussions with consultants, evaluation of patient's response to treatment,  examination of patient, obtaining history from patient or surrogate, ordering and performing treatments and interventions, ordering and review of laboratory studies, ordering and review of radiographic studies, pulse oximetry and re-evaluation of patient's condition.   ____________________________________________   INITIAL IMPRESSION / ASSESSMENT AND PLAN / ED COURSE  Pertinent labs & imaging results that were available during my care of the patient were reviewed by me and considered in my medical decision making (see chart for details).  Patient presents with a necrotic ulceration of his right foot. With his low-grade fever, tachycardia, diabetes, and foot findings, I suspect the patient is septic with cellulitis and soft tissue infection. No evidence of necrotizing fasciitis or abscess right now. We'll get an x-ray as well as check labs and reassess. Empiric IV vancomycin and Zosyn. I did discuss with the patient he'll likely need to be admitted, which she is resistant to. He states that he'll need to go home and take care of his dog first.   ----------------------------------------- 2:51 PM on 01/12/2016 -----------------------------------------   Labs reveal hyperglycemia and hyponatremia which corrects into the normal range for the hyperglycemia.Marland Kitchen He also has a leukocytosis. I discussed the results with the patient including a appears to have early sepsis from his cellulitis and with his diabetes he is at elevated risk of poor outcome including possible amputation of the foot. He reports that he needs to go home to take care of his dog and he'll come back tomorrow. He understands to return immediately or call EMS if his condition is worsening. He does have medical decision-making capacity at this time he has no SI or HI hallucinations, has clear rational thought procedure oriented. He'll be discharged AGAINST MEDICAL ADVICE with prescriptions for clindamycin and ciprofloxacin. He has a  primary care doctor as well as a follow-up appointment with vascular surgery at [facility] on February 3. I encouraged him to return to the emergency room as soon as possible for admission, but in the meantime he can follow up with these doctors. Will  finish infusing zosyn and vanco doses.    ____________________________________________   FINAL CLINICAL IMPRESSION(S) / ED DIAGNOSES  Final diagnoses:  Diabetic ulcer of right foot associated with type 2 diabetes mellitus (HCC)      Sharman Cheek, MD 01/12/16 1521

## 2016-01-12 NOTE — Progress Notes (Signed)
Dennis Fitzgerald, Dennis Fitzgerald ([id number]) Visit Report for 01/12/2016 Chief Complaint Document Details Patient Name: Dennis Fitzgerald Date of Service: 01/12/2016 8:00 AM Medical Record Number: [id number] Patient Account Number: [id number] Date of Birth/Sex: 1956-11-24 (60 y.o. Male) Treating RN: Curtis Sites Primary Care Physician: Darreld Mclean Other Clinician: Referring Physician: Darreld Mclean Treating Physician/Extender: Rudene Re in Treatment: 2 Information Obtained from: Patient Chief Complaint Patients presents for treatment of an open diabetic ulcer to his right lateral foot for about 3 weeks now. Electronic Signature(s) Signed: 01/12/2016 8:55:04 AM By: Evlyn Kanner MD, FACS Entered By: Evlyn Kanner on 01/12/2016 08:55:04 Dennis Fitzgerald ([id number]) -------------------------------------------------------------------------------- HPI Details Patient Name: Dennis Fitzgerald Date of Service: 01/12/2016 8:00 AM Medical Record Number: [id number] Patient Account Number: [id number] Date of Birth/Sex: Apr 27, 1956 (60 y.o. Male) Treating RN: Curtis Sites Primary Care Physician: Darreld Mclean Other Clinician: Referring Physician: Darreld Mclean Treating Physician/Extender: Rudene Re in Treatment: 2 History of Present Illness Location: right lateral foot noted to have a ulcerated area with purulent drainage Quality: Patient reports No Pain. Severity: Patient states wound are getting worse. Duration: Patient has had the wound for < 4 weeks prior to presenting for treatment Context: The wound appeared gradually over time Modifying Factors: Other treatment(s) tried include: he was put on a course of Augmentin by his PCP Associated Signs and Symptoms: Patient reports having increase discharge. HPI Description: A 60 year old diabetic patient who was noted by his PCP, Dr. Darreld Mclean to have a ulceration on the right foot for about 3 weeks now. Patient's past medical history significant for  diabetes mellitus with the last hemoglobin A1c being 7.4 in May 2016. Also known to have hypertension, hyperlipidemia, peripheral vascular disease for which he is being followed up at the [facility] vascular surgery department and has had a vascular procedures done. review of his vascular notes from [facility] revealed that he is had a left lower extremity vein bypass graft in August 2014 with a favorable graft study. he had a left common femoral artery to below-knee popliteal bypass graft with reversed great saphenous vein and a left common femoral artery endarterectomy and patch angioplasty.He also has bilateral lower extremity venous insufficiency, lymphedema. he was asked to wear compression garments for his bilateral lymphedema. last arterial duplex study done in May 2016 showed left lower extremity patent graft with no elevated velocities and toe pressure was 76 mmHg. He has been a smoker for several years and at present continues to smoke. He was referred to [place] for evaluation and treatment of his diabetic foot ulcer on the right and was prescribed a 10 day course of Augmentin recently. 01/04/2016 -- patient was seen by the vascular team at [facility] and the assessment and plan was that of bilateral peripheral arterial disease status post left lower extremity vein bypass graft with favorable scan done on 01/01/2016. The right lower extremity peripheral arterial disease with SFA stenosis 50-75% GTP 38 strong DP and PT signals. Should be able to heal wound but will have follow-up with the vascular surgeon in 2 weeks to discuss need for surgical debridement and revascularization. -- x-ray of the right foot was done on 12/28/2015 and it shows soft tissue wound along the lateral aspect of the foot with no bony abnormality except for diffuse degenerative changes. 01/12/2016 -- the patient complains that his right foot is hurting a lot has swollen up and there is foul- smelling discharge and the wound has taken a  turn for the worse for the last 4 days. Electronic Signature(s) Signed:  01/12/2016 8:55:30 AM By: Evlyn Kanner MD, FACS Entered By: Evlyn Kanner on 01/12/2016 08:55:30 Dennis Fitzgerald ([id number]) -------------------------------------------------------------------------------- Physical Exam Details Patient Name: Dennis Fitzgerald Date of Service: 01/12/2016 8:00 AM Medical Record Number: [id number] Patient Account Number: [id number] Date of Birth/Sex: 06/28/1956 (60 y.o. Male) Treating RN: Curtis Sites Primary Care Physician: Darreld Mclean Other Clinician: Referring Physician: Darreld Mclean Treating Physician/Extender: Rudene Re in Treatment: 2 Constitutional . Pulse regular. Respirations normal and unlabored. Afebrile. . Eyes Nonicteric. Reactive to light. Ears, Nose, Mouth, and Throat Lips, teeth, and gums WNL.Marland Kitchen Moist mucosa without lesions. Neck supple and nontender. No palpable supraclavicular or cervical adenopathy. Normal sized without goiter. Respiratory WNL. No retractions.. Cardiovascular Pedal Pulses WNL. No clubbing, cyanosis or edema. Chest Breasts symmetical and no nipple discharge.. Breast tissue WNL, no masses, lumps, or tenderness.. Lymphatic No adneopathy. No adenopathy. No adenopathy. Musculoskeletal Adexa without tenderness or enlargement.. Digits and nails w/o clubbing, cyanosis, infection, petechiae, ischemia, or inflammatory conditions.. Integumentary (Hair, Skin) No suspicious lesions. No crepitus or fluctuance. No peri-wound warmth or erythema. No masses.Marland Kitchen Psychiatric Judgement and insight Intact.. No evidence of depression, anxiety, or agitation.. Notes the right lateral foot has excessive amount of wet gangrene with surrounding cellulitis and blebs and this has taken a very bad turn since I saw him last week. Will need operative debridement in the OR. Electronic Signature(s) Signed: 01/12/2016 8:56:17 AM By: Evlyn Kanner MD, FACS Entered By:  Evlyn Kanner on 01/12/2016 08:56:17 Dennis Fitzgerald ([id number]) -------------------------------------------------------------------------------- Physician Orders Details Patient Name: Dennis Fitzgerald Date of Service: 01/12/2016 8:00 AM Medical Record Number: [id number] Patient Account Number: [id number] Date of Birth/Sex: [birth year] (60 y.o. Male) Treating RN: Curtis Sites Primary Care Physician: Darreld Mclean Other Clinician: Referring Physician: Darreld Mclean Treating Physician/Extender: Rudene Re in Treatment: 2 Verbal / Phone Orders: Yes Clinician: Curtis Sites Read Back and Verified: Yes Diagnosis Coding Wound Cleansing Wound #1 Right,Lateral Foot o Clean wound with Normal Saline. Anesthetic Wound #1 Right,Lateral Foot o Topical Lidocaine 4% cream applied to wound bed prior to debridement Primary Wound Dressing Wound #1 Right,Lateral Foot o Dry Gauze Secondary Dressing Wound #1 Right,Lateral Foot o ABD and Kerlix/Conform Dressing Change Frequency Wound #1 Right,Lateral Foot o Change dressing every day. Follow-up Appointments Wound #1 Right,Lateral Foot o Return Appointment in 1 week. Edema Control Wound #1 Right,Lateral Foot o Elevate legs to the level of the heart and pump ankles as often as possible o Other: - use compression stockings as prescribed Notes Go straight to [facility] ED related to wound infection Electronic Signature(s) Signed: 01/12/2016 2:32:13 PM By: Evlyn Kanner MD, FACS [place], [name] ([id number]) Signed: 01/12/2016 3:44:25 PM By: Curtis Sites Entered By: Curtis Sites on 01/12/2016 08:33:22 Dennis Fitzgerald ([id number]) -------------------------------------------------------------------------------- Problem List Details Patient Name: Dennis Fitzgerald Date of Service: 01/12/2016 8:00 AM Medical Record Number: [id number] Patient Account Number: [id number] Date of Birth/Sex: [birth year] (60 y.o. 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 I89.0 Lymphedema, not elsewhere classified 12/28/2015 Yes I73.9 Peripheral vascular disease, unspecified 12/28/2015 Yes F17.218 Nicotine dependence, cigarettes, with other nicotine- 12/28/2015 Yes induced disorders E66.01 Morbid (severe) obesity due to excess calories 12/28/2015 Yes E11.52 Type 2 diabetes mellitus with diabetic peripheral 01/12/2016 Yes angiopathy with  gangrene Inactive Problems Resolved Problems Electronic Signature(s) Signed: 01/12/2016 8:54:56 AM By: Evlyn Kanner MD, FACS Entered By: Evlyn Kanner on 01/12/2016 08:54:56 Dennis Fitzgerald ([id number]) -------------------------------------------------------------------------------- Progress Note Details Patient Name: Dennis Fitzgerald Date of Service: 01/12/2016 8:00 AM Medical Record Number: [id number] Patient Account Number: [id number] Date of Birth/Sex: 1956/01/02 (60 y.o. Male) Treating RN: Curtis Sites Primary Care Physician: Darreld Mclean Other Clinician: Referring Physician: Darreld Mclean Treating Physician/Extender: Rudene Re in Treatment: 2 Subjective Chief Complaint Information obtained from Patient Patients presents for treatment of an open diabetic ulcer to his right lateral foot for about 3 weeks now. History of Present Illness (HPI) The following HPI elements were documented for the patient's wound: Location: right lateral foot noted to have a ulcerated area with purulent drainage Quality: Patient reports No Pain. Severity: Patient states wound are getting worse. Duration: Patient has had the wound for < 4 weeks  prior to presenting for treatment Context: The wound appeared gradually over time Modifying Factors: Other treatment(s) tried include: he was put on a course of Augmentin by his PCP Associated Signs and Symptoms: Patient reports having increase discharge. A 60 year old diabetic patient who was noted by his PCP, Dr. Darreld Mclean to have a ulceration on the right foot for about 3 weeks now. Patient's past medical history significant for diabetes mellitus with the last hemoglobin A1c being 7.4 in May 2016. Also known to have hypertension, hyperlipidemia, peripheral vascular disease for which he is being followed up at the [facility] vascular surgery department and has had a vascular procedures done. review of his vascular notes from [facility] revealed that he is had a left lower extremity vein bypass graft in August 2014 with a favorable graft study. he had a left common femoral artery to below-knee popliteal bypass graft with reversed great saphenous vein and a left common femoral artery endarterectomy and patch angioplasty.He also has bilateral lower extremity venous insufficiency, lymphedema. he was asked to wear compression garments for his bilateral lymphedema. last arterial duplex study done in May 2016 showed left lower extremity patent graft with no elevated velocities and toe pressure was 76 mmHg. He has been a smoker for several years and at present continues to smoke. He was referred to [place] for evaluation and treatment of his diabetic foot ulcer on the right and was prescribed a 10 day course of Augmentin recently. 01/04/2016 -- patient was seen by the vascular team at [facility] and the assessment and plan was that of bilateral peripheral arterial disease status post left lower extremity vein bypass graft with favorable scan done on 01/01/2016. The right lower extremity peripheral arterial disease with SFA stenosis 50-75% GTP 38 strong DP and PT signals. Should be able to heal wound but will have follow-up  with the vascular surgeon in 2 weeks to discuss need for surgical debridement and revascularization. -- x-ray of the right foot was done on 12/28/2015 and it shows soft tissue wound along the lateral aspect of the foot with no bony abnormality except for diffuse degenerative changes. 01/12/2016 -- the patient complains that his right foot is hurting a lot has swollen up and there is foul- smelling discharge and the wound has taken a turn for the worse for the last 4 days. Dennis Fitzgerald, Dennis Fitzgerald ([id number]) Objective Constitutional Pulse regular. Respirations normal and unlabored. Afebrile. Vitals Time Taken: 8:18 AM, Height: 71 in, Weight: 267 lbs, BMI: 37.2, Temperature: 98.3 F, Pulse: 94 bpm, Respiratory Rate: 18 breaths/min, Blood Pressure: 137/71 mmHg. Eyes Nonicteric. Reactive to light. Ears, Nose,  Mouth, and Throat Lips, teeth, and gums WNL.Marland Kitchen Moist mucosa without lesions. Neck supple and nontender. No palpable supraclavicular or cervical adenopathy. Normal sized without goiter. Respiratory WNL. No retractions.. Cardiovascular Pedal Pulses WNL. No clubbing, cyanosis or edema. Chest Breasts symmetical and no nipple discharge.. Breast tissue WNL, no masses, lumps, or tenderness.. Lymphatic No adneopathy. No adenopathy. No adenopathy. Musculoskeletal Adexa without tenderness or enlargement.. Digits and nails w/o clubbing, cyanosis, infection, petechiae, ischemia, or inflammatory conditions.Marland Kitchen Psychiatric Judgement and insight Intact.. No evidence of depression, anxiety, or agitation.. General Notes: the right lateral foot has excessive amount of wet gangrene with surrounding cellulitis and blebs and this has taken a very bad turn since I saw him last week. Will need operative debridement in the OR. Integumentary (Hair, Skin) No suspicious lesions. No crepitus or fluctuance. No peri-wound warmth or erythema. No masses.Marland Kitchen Dennis Fitzgerald, Dennis Fitzgerald ([id number]) Wound #1 status is Open. Original cause  of wound was Gradually Appeared. The wound is located on the Right,Lateral Foot. The wound measures 2.8cm length x 4.7cm width x 0.3cm depth; 10.336cm^2 area and 3.101cm^3 volume. Wound #2 status is Healed - Epithelialized. Original cause of wound was Gradually Appeared. The wound is located on the Right,Medial Lower Leg. The wound measures 0cm length x 0cm width x 0cm depth; 0cm^2 area and 0cm^3 volume. Assessment Active Problems ICD-10 E11.621 - Type 2 diabetes mellitus with foot ulcer L97.512 - Non-pressure chronic ulcer of other part of right foot with fat layer exposed I89.0 - Lymphedema, not elsewhere classified I73.9 - Peripheral vascular disease, unspecified F17.218 - Nicotine dependence, cigarettes, with other nicotine-induced disorders E66.01 - Morbid (severe) obesity due to excess calories E11.52 - Type 2 diabetes mellitus with diabetic peripheral angiopathy with gangrene Since I saw him last week the patient now has wet gangrene of the right lateral foot with significant foul- smelling discharge. He needs operative debridement in the operating room and wants to go to [facility] has his vascular surgeon and previous workup has been done there and has a recent visit and follow-up planned. I have impressed upon him the need to go to the emergency room right away and he chooses to go to [facility]. I would be happy to talk to his physicians at any stage and he has my phone numbers to call. We will see him back for further wound care once he is discharged from the hospital. Plan Wound Cleansing: Wound #1 Right,Lateral Foot: Clean wound with Normal Saline. Anesthetic: Wound #1 Right,Lateral Foot: Topical Lidocaine 4% cream applied to wound bed prior to debridement Primary Wound Dressing: Wound #1 Right,Lateral Foot: Achey, Brance ([id number]) Dry Gauze Secondary Dressing: Wound #1 Right,Lateral Foot: ABD and Kerlix/Conform Dressing Change Frequency: Wound #1 Right,Lateral Foot: Change  dressing every day. Follow-up Appointments: Wound #1 Right,Lateral Foot: Return Appointment in 1 week. Edema Control: Wound #1 Right,Lateral Foot: Elevate legs to the level of the heart and pump ankles as often as possible Other: - use compression stockings as prescribed General Notes: Go straight to [facility] ED related to wound infection Since I saw him last week the patient now has wet gangrene of the right lateral foot with significant foul- smelling discharge. He needs operative debridement in the operating room and wants to go to [facility] has his vascular surgeon and previous workup has been done there and has a recent visit and follow-up planned. I have impressed upon him the need to go to the emergency room right away and he chooses to go to [facility]. I would be happy  to talk to his physicians at any stage and he has my phone numbers to call. We will see him back for further wound care once he is discharged from the hospital. Electronic Signature(s) Signed: 01/12/2016 8:58:06 AM By: Evlyn Kanner MD, FACS Entered By: Evlyn Kanner on 01/12/2016 08:58:05 Dennis Fitzgerald ([id number]) -------------------------------------------------------------------------------- SuperBill Details Patient Name: Dennis Fitzgerald Date of Service: 01/12/2016 Medical Record Number: [id number] Patient Account Number: [id number] Date of Birth/Sex: [birth year] (60 y.o. 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 Facility Procedures CPT4 Code: [id number] Description: 99213 - WOUND CARE VISIT-LEV 3 EST PT Modifier: Quantity: 1 Physician Procedures CPT4 Code Description: [id number] 99213 - WC PHYS LEVEL 3 - EST PT ICD-10 Description Diagnosis E11.621 Type 2 diabetes mellitus with foot ulcer L97.512 Non-pressure chronic ulcer of other part of right foo E11.52 Type 2 diabetes mellitus with diabetic  peripheral ang I73.9 Peripheral vascular disease, unspecified Modifier: t with fat la iopathy with Quantity: 1 yer exposed gangrene Electronic Signature(s) Signed: 01/12/2016 11:35:55 AM By: Curtis Sites Signed: 01/12/2016 2:32:13 PM By: Evlyn Kanner MD, FACS Previous Signature: 01/12/2016 8:58:28 AM Version By: Evlyn Kanner MD, FACS Entered By: Curtis Sites on 01/12/2016 11:35:54

## 2016-01-12 NOTE — Discharge Instructions (Signed)
Return to the ER as soon as you are able to continue your treatment.  Your infected foot wound appears to require surgical debridement to clean up the infected tissue, and continuous antibiotics are needed to help improve the skin infection and keep the condition from worsening.    Diabetes and Foot Care Diabetes may cause you to have problems because of poor blood supply (circulation) to your feet and legs. This may cause the skin on your feet to become thinner, break easier, and heal more slowly. Your skin may become dry, and the skin may peel and crack. You may also have nerve damage in your legs and feet causing decreased feeling in them. You may not notice minor injuries to your feet that could lead to infections or more serious problems. Taking care of your feet is one of the most important things you can do for yourself.  HOME CARE INSTRUCTIONS  Wear shoes at all times, even in the house. Do not go barefoot. Bare feet are easily injured.  Check your feet daily for blisters, cuts, and redness. If you cannot see the bottom of your feet, use a mirror or ask someone for help.  Wash your feet with warm water (do not use hot water) and mild soap. Then pat your feet and the areas between your toes until they are completely dry. Do not soak your feet as this can dry your skin.  Apply a moisturizing lotion or petroleum jelly (that does not contain alcohol and is unscented) to the skin on your feet and to dry, brittle toenails. Do not apply lotion between your toes.  Trim your toenails straight across. Do not dig under them or around the cuticle. File the edges of your nails with an emery board or nail file.  Do not cut corns or calluses or try to remove them with medicine.  Wear clean socks or stockings every day. Make sure they are not too tight. Do not wear knee-high stockings since they may decrease blood flow to your legs.  Wear shoes that fit properly and have enough cushioning. To break in  new shoes, wear them for just a few hours a day. This prevents you from injuring your feet. Always look in your shoes before you put them on to be sure there are no objects inside.  Do not cross your legs. This may decrease the blood flow to your feet.  If you find a minor scrape, cut, or break in the skin on your feet, keep it and the skin around it clean and dry. These areas may be cleansed with mild soap and water. Do not cleanse the area with peroxide, alcohol, or iodine.  When you remove an adhesive bandage, be sure not to damage the skin around it.  If you have a wound, look at it several times a day to make sure it is healing.  Do not use heating pads or hot water bottles. They may burn your skin. If you have lost feeling in your feet or legs, you may not know it is happening until it is too late.  Make sure your health care provider performs a complete foot exam at least annually or more often if you have foot problems. Report any cuts, sores, or bruises to your health care provider immediately. SEEK MEDICAL CARE IF:   You have an injury that is not healing.  You have cuts or breaks in the skin.  You have an ingrown nail.  You notice redness on  your legs or feet.  You feel burning or tingling in your legs or feet.  You have pain or cramps in your legs and feet.  Your legs or feet are numb.  Your feet always feel cold. SEEK IMMEDIATE MEDICAL CARE IF:   There is increasing redness, swelling, or pain in or around a wound.  There is a red line that goes up your leg.  Pus is coming from a wound.  You develop a fever or as directed by your health care provider.  You notice a bad smell coming from an ulcer or wound.   This information is not intended to replace advice given to you by your health care provider. Make sure you discuss any questions you have with your health care provider.   Document Released: 12/02/2000 Document Revised: 08/07/2013 Document Reviewed:  05/14/2013 Elsevier Interactive Patient Education [employer]

## 2016-01-12 NOTE — ED Notes (Signed)
Received report from [facility], care assumed.  Pt resting.

## 2016-01-12 NOTE — Progress Notes (Addendum)
Dennis, Fitzgerald ([id number]) Visit Report for 01/12/2016 Arrival Information Details Patient Name: Dennis Fitzgerald, Dennis Fitzgerald Date of Service: 01/12/2016 8:00 AM Medical Record Number: [id number] Patient Account Number: [id number] Date of Birth/Sex: [birth year] (60 y.o. Male) Treating RN: Curtis Sites Primary Care Physician: Darreld Mclean Other Clinician: Referring Physician: Darreld Mclean Treating Physician/Extender: Rudene Re in Treatment: 2 Visit Information History Since Last Visit Added or deleted any medications: No Patient Arrived: Ambulatory Any new allergies or adverse reactions: No Arrival Time: 08:11 Had a fall or experienced change in No Accompanied By: self activities of daily living that may affect Transfer Assistance: None risk of falls: Patient Identification Verified: Yes Signs or symptoms of abuse/neglect since last No Secondary Verification Process Yes visito Completed: Hospitalized since last visit: No Patient Requires Transmission-Based No Pain Present Now: No Precautions: Patient Has Alerts: Yes Patient Alerts: DMII aspirin 81 Electronic Signature(s) Signed: 01/12/2016 3:44:25 PM By: Curtis Sites Entered By: Curtis Sites on 01/12/2016 08:12:46 Dennis Fitzgerald ([id number]) -------------------------------------------------------------------------------- Clinic Level of Care Assessment Details Patient Name: Dennis Fitzgerald Date of Service: 01/12/2016 8:00 AM Medical Record Number: [id number] Patient Account Number: [id number] Date of Birth/Sex: 1956-01-05 (60 y.o. Male) Treating RN: Curtis Sites Primary Care Physician: Darreld Mclean Other Clinician: Referring Physician: Darreld Mclean Treating Physician/Extender: Rudene Re in Treatment: 2 Clinic Level of Care Assessment Items TOOL 4 Quantity Score  - Use when only an EandM is performed on FOLLOW-UP visit 0 ASSESSMENTS - Nursing Assessment / Reassessment X - Reassessment of Co-morbidities (includes  updates in patient status) 1 10 X - Reassessment of Adherence to Treatment Plan 1 5 ASSESSMENTS - Wound and Skin Assessment / Reassessment X - Simple Wound Assessment / Reassessment - one wound 1 5  - Complex Wound Assessment / Reassessment - multiple wounds 0  - Dermatologic / Skin Assessment (not related to wound area) 0 ASSESSMENTS - Focused Assessment  - Circumferential Edema Measurements - multi extremities 0  - Nutritional Assessment / Counseling / Intervention 0 X - Lower Extremity Assessment (monofilament, tuning fork, pulses) 1 5  - Peripheral Arterial Disease Assessment (using hand held doppler) 0 ASSESSMENTS - Ostomy and/or Continence Assessment and Care  - Incontinence Assessment and Management 0  - Ostomy Care Assessment and Management (repouching, etc.) 0 PROCESS - Coordination of Care X - Simple Patient / Family Education for ongoing care 1 15  - Complex (extensive) Patient / Family Education for ongoing care 0  - Staff obtains Chiropractor, Records, Test Results / Process Orders 0  - Staff telephones HHA, Nursing Homes / Clarify orders / etc 0  - Routine Transfer to another Facility (non-emergent condition) 0 Dahlen, Willaim ([id number])  - Routine Hospital Admission (non-emergent condition) 0  - New Admissions / Manufacturing engineer / Ordering NPWT, Apligraf, etc. 0  - Emergency Hospital Admission (emergent condition) 0 X - Simple Discharge Coordination 1 10  - Complex (extensive) Discharge Coordination 0 PROCESS - Special Needs  - Pediatric / Minor Patient Management 0  - Isolation Patient Management 0  - Hearing / Language / Visual special needs 0  - Assessment of Community assistance (transportation, D/C planning, etc.) 0  - Additional assistance / Altered mentation 0  - Support Surface(s) Assessment (bed, cushion, seat, etc.) 0 INTERVENTIONS - Wound Cleansing / Measurement X - Simple Wound Cleansing - one wound 1 5  -  Complex Wound Cleansing - multiple wounds 0 X - Wound Imaging (photographs - any number of wounds) 1 5  - Wound Tracing (instead of photographs) 0 X -  Simple Wound Measurement - one wound 1 5  - Complex Wound Measurement - multiple wounds 0 INTERVENTIONS - Wound Dressings X - Small Wound Dressing one or multiple wounds 1 10  - Medium Wound Dressing one or multiple wounds 0  - Large Wound Dressing one or multiple wounds 0  - Application of Medications - topical 0  - Application of Medications - injection 0 INTERVENTIONS - Miscellaneous  - External ear exam 0 Penland, Kameron ([id number])  - Specimen Collection (cultures, biopsies, blood, body fluids, etc.) 0  - Specimen(s) / Culture(s) sent or taken to Lab for analysis 0  - Patient Transfer (multiple staff / Michiel Sites Lift / Similar devices) 0  - Simple Staple / Suture removal (25 or less) 0  - Complex Staple / Suture removal (26 or more) 0  - Hypo / Hyperglycemic Management (close monitor of Blood Glucose) 0  - Ankle / Brachial Index (ABI) - do not check if billed separately 0 X - Vital Signs 1 5 Has the patient been seen at the hospital within the last three years: Yes Total Score: 80 Level Of Care: New/Established - Level 3 Electronic Signature(s) Signed: 01/12/2016 11:35:35 AM By: Curtis Sites Entered By: Curtis Sites on 01/12/2016 11:35:34 Dennis Fitzgerald ([id number]) -------------------------------------------------------------------------------- Encounter Discharge Information Details Patient Name: Dennis Fitzgerald Date of Service: 01/12/2016 8:00 AM Medical Record Number: [id number] Patient Account Number: [id number] Date of Birth/Sex: [birth year] (60 y.o. Male) Treating RN: Curtis Sites Primary Care Physician: Darreld Mclean Other Clinician: Referring Physician: Darreld Mclean Treating Physician/Extender: Rudene Re in Treatment: 2 Encounter Discharge Information Items Discharge Pain Level:  0 Discharge Condition: Stable Ambulatory Status: Cane Emergency Discharge Destination: Room Transportation: Private Auto Accompanied By: self Schedule Follow-up Appointment: Yes Medication Reconciliation completed and provided to Patient/Care No Makai Dumond: Provided on Clinical Summary of Care: 01/12/2016 Form Type Recipient Paper Patient BB Electronic Signature(s) Signed: 01/12/2016 11:20:07 AM By: Curtis Sites Previous Signature: 01/12/2016 8:42:11 AM Version By: Gwenlyn Perking Entered By: Curtis Sites on 01/12/2016 11:20:07 Dennis Fitzgerald ([id number]) -------------------------------------------------------------------------------- Lower Extremity Assessment Details Patient Name: Dennis Fitzgerald Date of Service: 01/12/2016 8:00 AM Medical Record Number: [id number] Patient Account Number: [id number] Date of Birth/Sex: Feb 24, 1956 (60 y.o. Male) Treating RN: Curtis Sites Primary Care Physician: Darreld Mclean Other Clinician: Referring Physician: Darreld Mclean Treating Physician/Extender: Rudene Re in Treatment: 2 Edema Assessment Assessed: [Left: No] [Right: No] Edema: [Left: Ye] [Right: s] Vascular Assessment Pulses: Posterior Tibial Dorsalis Pedis Palpable: [Right:Yes] Extremity colors, hair growth, and conditions: Extremity Color: [Right:Red] Hair Growth on Extremity: [Right:No] Temperature of Extremity: [Right:Warm] Capillary Refill: [Right:< 3 seconds] Toe Nail Assessment Left: Right: Thick: Yes Discolored: Yes Deformed: Yes Improper Length and Hygiene: No Electronic Signature(s) Signed: 01/12/2016 3:44:25 PM By: Curtis Sites Entered By: Curtis Sites on 01/12/2016 08:24:12 Dennis Fitzgerald ([id number]) -------------------------------------------------------------------------------- Multi Wound Chart Details Patient Name: Dennis Fitzgerald Date of Service: 01/12/2016 8:00 AM Medical Record Number: [id number] Patient Account Number: [id number] Date of  Birth/Sex: August 18, 1956 (60 y.o. Male) Treating RN: Curtis Sites Primary Care Physician: Darreld Mclean Other Clinician: Referring Physician: Darreld Mclean Treating Physician/Extender: Rudene Re in Treatment: 2 Vital Signs Height(in): 71 Pulse(bpm): 94 Weight(lbs): 267 Blood Pressure 137/71 (mmHg): Body Mass Index(BMI): 37 Temperature(F): 98.3 Respiratory Rate 18 (breaths/min): Photos: [1:No Photos] [2:No Photos] [N/A:N/A] Wound Location: [1:Right, Lateral Foot] [2:Right, Medial Lower Leg] [N/A:N/A] Wounding Event: [1:Gradually Appeared] [2:Gradually Appeared] [N/A:N/A] Primary Etiology: [1:Diabetic Wound/Ulcer of the Lower Extremity] [2:Lymphedema] [N/A:N/A] Date Acquired: [1:12/13/2015] [2:12/21/2015] [N/A:N/A] Weeks of Treatment: [1:2] [2:2] [N/A:N/A] Wound  Status: [1:Open] [2:Healed - Epithelialized] [N/A:N/A] Pending Amputation on Yes [2:No] [N/A:N/A] Presentation: Measurements L x W x D 2.8x4.7x0.3 [2:0x0x0] [N/A:N/A] (cm) Area (cm) : [1:10.336] [2:0] [N/A:N/A] Volume (cm) : [1:3.101] [2:0] [N/A:N/A] % Reduction in Area: [1:-16.10%] [2:100.00%] [N/A:N/A] % Reduction in Volume: -16.10% [2:100.00%] [N/A:N/A] Classification: [1:Grade 1] [2:Partial Thickness] [N/A:N/A] Periwound Skin Texture: No Abnormalities Noted [2:No Abnormalities Noted] [N/A:N/A] Periwound Skin [1:No Abnormalities Noted] [2:No Abnormalities Noted] [N/A:N/A] Moisture: Periwound Skin Color: No Abnormalities Noted [2:No Abnormalities Noted] [N/A:N/A] Tenderness on [1:No] [2:No] [N/A:N/A] Treatment Notes Electronic Signature(s) Signed: 01/12/2016 3:44:25 PM By: Estanislado Pandy, [name] ([id number]) Entered By: Curtis Sites on 01/12/2016 08:24:23 Dennis Fitzgerald ([id number]) -------------------------------------------------------------------------------- Multi-Disciplinary Care Plan Details Patient Name: Dennis Fitzgerald Date of Service: 01/12/2016 8:00 AM Medical Record Number:  [id number] Patient Account Number: [id number] Date of Birth/Sex: 30-Dec-1955 (60 y.o. Male) Treating RN: Curtis Sites Primary Care Physician: Darreld Mclean Other Clinician: Referring Physician: Darreld Mclean Treating Physician/Extender: Rudene Re in Treatment: 2 Active Inactive Electronic Signature(s) Signed: 02/23/2016 1:05:23 PM By: Curtis Sites Previous Signature: 01/12/2016 3:44:25 PM Version By: Curtis Sites Entered By: Curtis Sites on 02/23/2016 13:05:22 Dennis Fitzgerald ([id number]) -------------------------------------------------------------------------------- Patient/Caregiver Education Details Patient Name: Dennis Fitzgerald Date of Service: 01/12/2016 8:00 AM Medical Record Number: [id number] Patient Account Number: [id number] Date of Birth/Gender: May 02, 1956 (60 y.o. Male) Treating RN: Curtis Sites Primary Care Physician: Darreld Mclean Other Clinician: Referring Physician: Darreld Mclean Treating Physician/Extender: Rudene Re in Treatment: 2 Education Assessment Education Provided To: Patient Education Topics Provided Wound/Skin Impairment: Handouts: Other: go to [facility] ED r/t wound infection Methods: Explain/Verbal Responses: State content correctly Electronic Signature(s) Signed: 01/12/2016 11:20:27 AM By: Curtis Sites Entered By: Curtis Sites on 01/12/2016 11:20:26 Dennis Fitzgerald ([id number]) -------------------------------------------------------------------------------- Wound Assessment Details Patient Name: Dennis Fitzgerald Date of Service: 01/12/2016 8:00 AM Medical Record Number: [id number] Patient Account Number: [id number] Date of Birth/Sex: 1956/02/12 (60 y.o. Male) Treating RN: Curtis Sites Primary Care Physician: Darreld Mclean Other Clinician: Referring Physician: Darreld Mclean Treating Physician/Extender: Rudene Re in Treatment: 2 Wound Status Wound Number: 1 Primary Diabetic Wound/Ulcer of the Lower Etiology: Extremity Wound  Location: Right, Lateral Foot Wound Status: Open Wounding Event: Gradually Appeared Date Acquired: 12/13/2015 Weeks Of Treatment: 2 Clustered Wound: No Pending Amputation On Presentation Photos Photo Uploaded By: Curtis Sites on 01/12/2016 11:44:41 Wound Measurements Length: (cm) 2.8 Width: (cm) 4.7 Depth: (cm) 0.3 Area: (cm) 10.336 Volume: (cm) 3.101 % Reduction in Area: -16.1% % Reduction in Volume: -16.1% Wound Description Classification: Grade 1 Periwound Skin Texture Texture Color No Abnormalities Noted: No No Abnormalities Noted: No Moisture No Abnormalities Noted: No Electronic Signature(s) Signed: 01/12/2016 3:44:25 PM By: Estanislado Pandy, [name] ([id number]) Entered By: Curtis Sites on 01/12/2016 08:21:47 Dennis Fitzgerald ([id number]) -------------------------------------------------------------------------------- Wound Assessment Details Patient Name: Dennis Fitzgerald Date of Service: 01/12/2016 8:00 AM Medical Record Number: [id number] Patient Account Number: [id number] Date of Birth/Sex: 1956-12-03 (60 y.o. Male) Treating RN: Curtis Sites Primary Care Physician: Darreld Mclean Other Clinician: Referring Physician: Darreld Mclean Treating Physician/Extender: Rudene Re in Treatment: 2 Wound Status Wound Number: 2 Primary Etiology: Lymphedema Wound Location: Right, Medial Lower Leg Wound Status: Healed - Epithelialized Wounding Event: Gradually Appeared Date Acquired: 12/21/2015 Weeks Of Treatment: 2 Clustered Wound: No Photos Photo Uploaded By: Curtis Sites on 01/12/2016 11:44:42 Wound Measurements Length: (cm) 0 % Reduction in Width: (cm) 0 % Reduction in Depth: (cm) 0 Area: (cm) 0 Volume: (cm) 0 Area: 100% Volume: 100% Wound Description Classification: Partial Thickness Periwound Skin Texture Texture Color No  Abnormalities Noted: No No Abnormalities Noted: No Moisture No Abnormalities Noted: No Electronic  Signature(s) Signed: 01/12/2016 3:44:25 PM By: Estanislado Pandy, [name] ([id number]) Entered By: Curtis Sites on 01/12/2016 08:21:47 Dennis Fitzgerald ([id number]) -------------------------------------------------------------------------------- Vitals Details Patient Name: Dennis Fitzgerald Date of Service: 01/12/2016 8:00 AM Medical Record Number: [id number] Patient Account Number: [id number] Date of Birth/Sex: 01-18-1956 (60 y.o. Male) Treating RN: Curtis Sites Primary Care Physician: Darreld Mclean Other Clinician: Referring Physician: Darreld Mclean Treating Physician/Extender: Rudene Re in Treatment: 2 Vital Signs Time Taken: 08:18 Temperature (F): 98.3 Height (in): 71 Pulse (bpm): 94 Weight (lbs): 267 Respiratory Rate (breaths/min): 18 Body Mass Index (BMI): 37.2 Blood Pressure (mmHg): 137/71 Reference Range: 80 - 120 mg / dl Electronic Signature(s) Signed: 01/12/2016 3:44:25 PM By: Curtis Sites Entered By: Curtis Sites on 01/12/2016 08:65:78

## 2016-01-14 ENCOUNTER — Inpatient Hospital Stay: Payer: Medicaid Other

## 2016-01-14 ENCOUNTER — Inpatient Hospital Stay
Admission: EM | Admit: 2016-01-14 | Discharge: 2016-01-26 | DRG: 629 | Disposition: A | Discharge status: Skilled Nursing Facility | Payer: Medicaid Other | Attending: Internal Medicine | Admitting: Internal Medicine

## 2016-01-14 DIAGNOSIS — D7582 Heparin induced thrombocytopenia (HIT): Secondary | ICD-10-CM | POA: Diagnosis present

## 2016-01-14 DIAGNOSIS — E1152 Type 2 diabetes mellitus with diabetic peripheral angiopathy with gangrene: Secondary | ICD-10-CM | POA: Diagnosis present

## 2016-01-14 DIAGNOSIS — E08621 Diabetes mellitus due to underlying condition with foot ulcer: Secondary | ICD-10-CM | POA: Diagnosis not present

## 2016-01-14 DIAGNOSIS — Z794 Long term (current) use of insulin: Secondary | ICD-10-CM

## 2016-01-14 DIAGNOSIS — L97519 Non-pressure chronic ulcer of other part of right foot with unspecified severity: Secondary | ICD-10-CM | POA: Diagnosis present

## 2016-01-14 DIAGNOSIS — Z79899 Other long term (current) drug therapy: Secondary | ICD-10-CM

## 2016-01-14 DIAGNOSIS — E785 Hyperlipidemia, unspecified: Secondary | ICD-10-CM | POA: Diagnosis present

## 2016-01-14 DIAGNOSIS — L97929 Non-pressure chronic ulcer of unspecified part of left lower leg with unspecified severity: Secondary | ICD-10-CM | POA: Diagnosis present

## 2016-01-14 DIAGNOSIS — L089 Local infection of the skin and subcutaneous tissue, unspecified: Secondary | ICD-10-CM

## 2016-01-14 DIAGNOSIS — Z452 Encounter for adjustment and management of vascular access device: Secondary | ICD-10-CM

## 2016-01-14 DIAGNOSIS — F17210 Nicotine dependence, cigarettes, uncomplicated: Secondary | ICD-10-CM | POA: Diagnosis present

## 2016-01-14 DIAGNOSIS — E7800 Pure hypercholesterolemia, unspecified: Secondary | ICD-10-CM | POA: Diagnosis present

## 2016-01-14 DIAGNOSIS — E11621 Type 2 diabetes mellitus with foot ulcer: Secondary | ICD-10-CM | POA: Diagnosis present

## 2016-01-14 DIAGNOSIS — E1165 Type 2 diabetes mellitus with hyperglycemia: Secondary | ICD-10-CM | POA: Diagnosis present

## 2016-01-14 DIAGNOSIS — Z8249 Family history of ischemic heart disease and other diseases of the circulatory system: Secondary | ICD-10-CM | POA: Diagnosis not present

## 2016-01-14 DIAGNOSIS — I890 Lymphedema, not elsewhere classified: Secondary | ICD-10-CM | POA: Diagnosis present

## 2016-01-14 DIAGNOSIS — T148XXA Other injury of unspecified body region, initial encounter: Secondary | ICD-10-CM

## 2016-01-14 DIAGNOSIS — Z6834 Body mass index (BMI) 34.0-34.9, adult: Secondary | ICD-10-CM

## 2016-01-14 DIAGNOSIS — I739 Peripheral vascular disease, unspecified: Secondary | ICD-10-CM | POA: Diagnosis present

## 2016-01-14 DIAGNOSIS — I10 Essential (primary) hypertension: Secondary | ICD-10-CM | POA: Diagnosis present

## 2016-01-14 DIAGNOSIS — T148 Other injury of unspecified body region: Secondary | ICD-10-CM | POA: Diagnosis present

## 2016-01-14 DIAGNOSIS — Z7982 Long term (current) use of aspirin: Secondary | ICD-10-CM

## 2016-01-14 DIAGNOSIS — J449 Chronic obstructive pulmonary disease, unspecified: Secondary | ICD-10-CM | POA: Diagnosis present

## 2016-01-14 DIAGNOSIS — E11622 Type 2 diabetes mellitus with other skin ulcer: Secondary | ICD-10-CM | POA: Diagnosis present

## 2016-01-14 DIAGNOSIS — M869 Osteomyelitis, unspecified: Secondary | ICD-10-CM | POA: Diagnosis present

## 2016-01-14 DIAGNOSIS — E1169 Type 2 diabetes mellitus with other specified complication: Secondary | ICD-10-CM | POA: Diagnosis present

## 2016-01-14 DIAGNOSIS — Z833 Family history of diabetes mellitus: Secondary | ICD-10-CM

## 2016-01-14 DIAGNOSIS — L97509 Non-pressure chronic ulcer of other part of unspecified foot with unspecified severity: Secondary | ICD-10-CM

## 2016-01-14 LAB — GLUCOSE, CAPILLARY
Glucose-Capillary: 138 mg/dL — ABNORMAL HIGH (ref 65–99)
Glucose-Capillary: 70 mg/dL (ref 65–99)

## 2016-01-14 LAB — BASIC METABOLIC PANEL
ANION GAP: 11 (ref 5–15)
BUN: 9 mg/dL (ref 6–20)
CALCIUM: 8.5 mg/dL — AB (ref 8.9–10.3)
CO2: 26 mmol/L (ref 22–32)
Chloride: 90 mmol/L — ABNORMAL LOW (ref 101–111)
Creatinine, Ser: 0.79 mg/dL (ref 0.61–1.24)
GFR calc Af Amer: >60 mL/min (ref >60)
GLUCOSE: 238 mg/dL — AB (ref 65–99)
Potassium: 4.5 mmol/L (ref 3.5–5.1)
Sodium: 127 mmol/L — ABNORMAL LOW (ref 135–145)

## 2016-01-14 LAB — CBC
HCT: 41.1 % (ref 40.0–52.0)
HEMOGLOBIN: 13.8 g/dL (ref 13.0–18.0)
MCH: 27.8 pg (ref 26.0–34.0)
MCHC: 33.5 g/dL (ref 32.0–36.0)
MCV: 83 fL (ref 80.0–100.0)
Platelets: 255 10*3/uL (ref 150–440)
RBC: 4.95 MIL/uL (ref 4.40–5.90)
RDW: 15.1 % — AB (ref 11.5–14.5)
WBC: 12.1 10*3/uL — ABNORMAL HIGH (ref 3.8–10.6)

## 2016-01-14 LAB — TSH: TSH: 1.776 u[IU]/mL (ref 0.350–4.500)

## 2016-01-14 MED ORDER — ATORVASTATIN CALCIUM 20 MG PO TABS
40.0000 mg | ORAL_TABLET | Freq: Every evening | ORAL | Status: DC
  Administered 2016-01-14 – 2016-01-25 (×11): 40 mg via ORAL
  Filled 2016-01-14 (×9): qty 2
  Filled 2016-01-14: qty 1
  Filled 2016-01-14 (×3): qty 2

## 2016-01-14 MED ORDER — LISINOPRIL 10 MG PO TABS
10.0000 mg | ORAL_TABLET | Freq: Every evening | ORAL | Status: DC
  Administered 2016-01-14 – 2016-01-25 (×11): 10 mg via ORAL
  Filled 2016-01-14 (×11): qty 1

## 2016-01-14 MED ORDER — VANCOMYCIN HCL IN DEXTROSE 1-5 GM/200ML-% IV SOLN
1000.0000 mg | Freq: Once | INTRAVENOUS | Status: AC
  Administered 2016-01-14: 1000 mg via INTRAVENOUS
  Filled 2016-01-14: qty 200

## 2016-01-14 MED ORDER — MORPHINE SULFATE (PF) 2 MG/ML IV SOLN
2.0000 mg | INTRAVENOUS | Status: DC | PRN
  Administered 2016-01-15 – 2016-01-21 (×3): 2 mg via INTRAVENOUS
  Filled 2016-01-14 (×3): qty 1

## 2016-01-14 MED ORDER — ALBUTEROL SULFATE (2.5 MG/3ML) 0.083% IN NEBU: 2.5000 mg | INHALATION_SOLUTION | RESPIRATORY_TRACT | Status: DC | PRN

## 2016-01-14 MED ORDER — POTASSIUM CHLORIDE CRYS ER 10 MEQ PO TBCR
10.0000 meq | EXTENDED_RELEASE_TABLET | Freq: Every evening | ORAL | Status: DC
  Administered 2016-01-14 – 2016-01-25 (×11): 10 meq via ORAL
  Filled 2016-01-14 (×11): qty 1

## 2016-01-14 MED ORDER — INSULIN ASPART 100 UNIT/ML ~~LOC~~ SOLN: 0.0000 [IU] | Freq: Every day | SUBCUTANEOUS | Status: DC

## 2016-01-14 MED ORDER — PIPERACILLIN-TAZOBACTAM 3.375 G IVPB
3.3750 g | Freq: Three times a day (TID) | INTRAVENOUS | Status: DC
  Administered 2016-01-14 – 2016-01-22 (×21): 3.375 g via INTRAVENOUS
  Filled 2016-01-14 (×25): qty 50

## 2016-01-14 MED ORDER — VANCOMYCIN HCL IN DEXTROSE 1-5 GM/200ML-% IV SOLN
1000.0000 mg | Freq: Three times a day (TID) | INTRAVENOUS | Status: DC
  Administered 2016-01-14 – 2016-01-16 (×5): 1000 mg via INTRAVENOUS
  Filled 2016-01-14 (×6): qty 200

## 2016-01-14 MED ORDER — FUROSEMIDE 40 MG PO TABS
40.0000 mg | ORAL_TABLET | Freq: Two times a day (BID) | ORAL | Status: DC
  Administered 2016-01-14 – 2016-01-26 (×22): 40 mg via ORAL
  Filled 2016-01-14 (×22): qty 1

## 2016-01-14 MED ORDER — INSULIN ASPART 100 UNIT/ML ~~LOC~~ SOLN
0.0000 [IU] | Freq: Three times a day (TID) | SUBCUTANEOUS | Status: DC
  Administered 2016-01-14: 3 [IU] via SUBCUTANEOUS
  Administered 2016-01-15: 4 [IU] via SUBCUTANEOUS
  Administered 2016-01-16: 7 [IU] via SUBCUTANEOUS
  Administered 2016-01-17: 3 [IU] via SUBCUTANEOUS
  Administered 2016-01-17: 4 [IU] via SUBCUTANEOUS
  Administered 2016-01-18: 3 [IU] via SUBCUTANEOUS
  Administered 2016-01-21: 4 [IU] via SUBCUTANEOUS
  Administered 2016-01-21: 3 [IU] via SUBCUTANEOUS
  Administered 2016-01-21: 4 [IU] via SUBCUTANEOUS
  Administered 2016-01-22 – 2016-01-24 (×3): 3 [IU] via SUBCUTANEOUS
  Administered 2016-01-24 (×2): 4 [IU] via SUBCUTANEOUS
  Administered 2016-01-25 – 2016-01-26 (×3): 3 [IU] via SUBCUTANEOUS
  Filled 2016-01-14: qty 3
  Filled 2016-01-14 (×2): qty 4
  Filled 2016-01-14 (×6): qty 3
  Filled 2016-01-14: qty 7
  Filled 2016-01-14: qty 3
  Filled 2016-01-14 (×3): qty 4
  Filled 2016-01-14 (×2): qty 3
  Filled 2016-01-14: qty 4
  Filled 2016-01-14: qty 3
  Filled 2016-01-14: qty 4

## 2016-01-14 MED ORDER — UMECLIDINIUM-VILANTEROL 62.5-25 MCG/INH IN AEPB: 1.0000 | INHALATION_SPRAY | Freq: Every evening | RESPIRATORY_TRACT | Status: DC

## 2016-01-14 MED ORDER — PIPERACILLIN-TAZOBACTAM 3.375 G IVPB
3.3750 g | Freq: Once | INTRAVENOUS | Status: AC
  Administered 2016-01-14: 3.375 g via INTRAVENOUS
  Filled 2016-01-14: qty 50

## 2016-01-14 MED ORDER — CLOPIDOGREL BISULFATE 75 MG PO TABS
75.0000 mg | ORAL_TABLET | Freq: Every evening | ORAL | Status: DC
  Administered 2016-01-14: 75 mg via ORAL
  Filled 2016-01-14: qty 1

## 2016-01-14 MED ORDER — SALMETEROL XINAFOATE 50 MCG/DOSE IN AEPB
1.0000 | INHALATION_SPRAY | Freq: Two times a day (BID) | RESPIRATORY_TRACT | Status: DC
  Administered 2016-01-14 – 2016-01-26 (×20): 1 via RESPIRATORY_TRACT
  Filled 2016-01-14: qty 0

## 2016-01-14 MED ORDER — MAGIC MOUTHWASH
10.0000 mL | Freq: Three times a day (TID) | ORAL | Status: DC | PRN
  Administered 2016-01-15 – 2016-01-25 (×14): 10 mL via ORAL
  Filled 2016-01-14 (×14): qty 10

## 2016-01-14 MED ORDER — ASPIRIN EC 81 MG PO TBEC
81.0000 mg | DELAYED_RELEASE_TABLET | Freq: Every evening | ORAL | Status: DC
  Administered 2016-01-14: 81 mg via ORAL
  Filled 2016-01-14: qty 1

## 2016-01-14 MED ORDER — ENOXAPARIN SODIUM 40 MG/0.4ML ~~LOC~~ SOLN
40.0000 mg | SUBCUTANEOUS | Status: DC
  Administered 2016-01-14 – 2016-01-22 (×8): 40 mg via SUBCUTANEOUS
  Filled 2016-01-14 (×6): qty 0.4

## 2016-01-14 MED ORDER — INSULIN ASPART PROT & ASPART (70-30 MIX) 100 UNIT/ML ~~LOC~~ SUSP
55.0000 [IU] | Freq: Two times a day (BID) | SUBCUTANEOUS | Status: DC
Start: 2016-01-14 — End: 2016-01-15
  Administered 2016-01-14: 55 [IU] via SUBCUTANEOUS
  Filled 2016-01-14: qty 55

## 2016-01-14 MED ORDER — LINAGLIPTIN 5 MG PO TABS
5.0000 mg | ORAL_TABLET | Freq: Every evening | ORAL | Status: DC
  Administered 2016-01-14 – 2016-01-25 (×11): 5 mg via ORAL
  Filled 2016-01-14 (×11): qty 1

## 2016-01-14 MED ORDER — TIOTROPIUM BROMIDE MONOHYDRATE 18 MCG IN CAPS
18.0000 ug | ORAL_CAPSULE | Freq: Every day | RESPIRATORY_TRACT | Status: DC
  Administered 2016-01-14 – 2016-01-26 (×9): 18 ug via RESPIRATORY_TRACT
  Filled 2016-01-14 (×2): qty 5

## 2016-01-14 MED ORDER — OXYCODONE HCL 5 MG PO TABS
5.0000 mg | ORAL_TABLET | ORAL | Status: DC | PRN
  Administered 2016-01-15 – 2016-01-19 (×19): 5 mg via ORAL
  Filled 2016-01-14 (×19): qty 1

## 2016-01-14 NOTE — Progress Notes (Signed)
ANTIBIOTIC CONSULT NOTE - INITIAL  Pharmacy Consult for Vancomycin and Zosyn Indication: Diabetic foot ulcer  No Known Allergies  Patient Measurements: Height:  (180.3 cm) Weight: 250 lb (113.399 kg) IBW/kg (Calculated) : 75.3 Adjusted Body Weight: 91 kg  Vital Signs: Temp: 98.2 F (36.8 C) (01/26 1015) Temp Source: Oral (01/26 1015) BP: 147/82 mmHg (01/26 1133) Pulse Rate: 95 (01/26 1134) Intake/Output from previous day:   Intake/Output from this shift:    Labs:  Recent Labs  01/12/16 1328 01/14/16 1029  WBC 13.6* 12.1*  HGB 14.1 13.8  PLT 235 255  CREATININE 0.67 0.79   Estimated Creatinine Clearance: 127.3 mL/min (by C-G formula based on Cr of 0.79). No results for input(s): VANCOTROUGH, VANCOPEAK, VANCORANDOM, GENTTROUGH, GENTPEAK, GENTRANDOM, TOBRATROUGH, TOBRAPEAK, TOBRARND, AMIKACINPEAK, AMIKACINTROU, AMIKACIN in the last 72 hours.   Microbiology: Recent Results (from the past 720 hour(s))  Culture, blood (routine x 2)     Status: None (Preliminary result)   Collection Time: 01/12/16  1:15 PM  Result Value Ref Range Status   Specimen Description BLOOD LEFT HAND  Final   Special Requests   Final    BOTTLES DRAWN AEROBIC AND ANAEROBIC AERO 4CC ANA 5CC   Culture NO GROWTH 2 DAYS  Final   Report Status PENDING  Incomplete  Culture, blood (routine x 2)     Status: None (Preliminary result)   Collection Time: 01/12/16  1:24 PM  Result Value Ref Range Status   Specimen Description BLOOD RIGHT HAND  Final   Special Requests   Final    BOTTLES DRAWN AEROBIC AND ANAEROBIC AERO 9CC ANA 5CC   Culture NO GROWTH 2 DAYS  Final   Report Status PENDING  Incomplete    Medical History: Past Medical History  Diagnosis Date  . Diabetes mellitus without complication (HCC)   . Lymphedema      Assessment: 60 yo male here with diabetic foot ulcer and chronic lymphedema. Pt ordered Zosyn 3.375 g IV x1 and vancomycin 1000 mg IV x1 in the ED. 1/24 BCx x2  NGTD 1/26 WCx ordered  Ke 0.092, half life 7.5 h, Vd 63.7 L   Goal of Therapy:  Vancomycin trough level 15-20 mcg/ml  Plan:  Will continue dosing with vancomycin 1 g IV q8h to start 5 h after initial dose for stacked dosing.  Trough before 5th dose - 1/28 at 0330 Will need to continue to follow renal function and culture results.   Will continue dosing with Zosyn 3.375 g IV q8h EI.  Pharmacy will continue to follow.   Crist Fat L 01/14/2016,1:35 PM

## 2016-01-14 NOTE — ED Notes (Signed)
Patient has a skin ulcer on his right foot on the lateral side; approximately 2 inches long and half inch wide; redness noted to right lower leg. Dried brown discharge noted to ulcer with foul odor. Bilateral lower leg edema noted. Pt states has lymphedema bilaterally. Pt states was seen last week and admission for debridement was recommended. Pt states could not stay at the time but has now made arrangements and can be admitted.

## 2016-01-14 NOTE — ED Notes (Signed)
Called IT regarding broken scanner; incident number [phone]

## 2016-01-14 NOTE — H&P (Signed)
[facility] - [place] at [facility]   PATIENT NAME: Dennis Fitzgerald    MR#:  [id number]  DATE OF BIRTH:  1956/03/20  DATE OF ADMISSION:  01/14/2016  PRIMARY CARE PHYSICIAN: Leanna Sato, MD   REQUESTING/REFERRING PHYSICIAN: Dr Alphonzo Lemmings  CHIEF COMPLAINT:   Diabetic foot ulcer HISTORY OF PRESENT ILLNESS:  Dennis Fitzgerald  is a 60 y.o. male with a known history of diabetes and chronic lymphedema who presents with above complaint. Patient has been seen by the wound care team since early December. At the end of November he says he stepped on something and since then has suffered a diabetic foot ulcer. He was asked to come this past Tuesday to the emergency room due to malodorous smell and worsening of the diabetic foot ulcer. He received IV Zosyn and vancomycin in the emergency room and said he had to be his CAT scan would be back today. He presents today with worsening of his diabetic foot ulcer as per the request of his wound care clinician. He is started on IV Zosyn and vancomycin again in hospital service was asked to see the patient consultation. She has chronic lymphedema and says the redness in his right lower extremity has not changed over the past several years. Pain is 4 out of 10 currently. Patient denies any fever or chills.  PAST MEDICAL HISTORY:   Past Medical History  Diagnosis Date  . Diabetes mellitus without complication (HCC)   . Lymphedema    PAD Morbid obesity Tobacco dependence  PAST SURGICAL HISTORY:  Left femoral bypass for PAD  SOCIAL HISTORY:   Social History  Substance Use Topics  . Smoking status: Current Every Day Smoker  . Smokeless tobacco: Not on file  . Alcohol Use: No    FAMILY HISTORY:  Parents are deceased pulmonary living his father had diabetes and mother with hypertension  DRUG ALLERGIES:  No Known Allergies   REVIEW OF SYSTEMS:  CONSTITUTIONAL: No fever, fatigue or weakness.  EYES: No blurred or double vision.   EARS, NOSE, AND THROAT: No tinnitus or ear pain.  RESPIRATORY: No cough, shortness of breath, wheezing or hemoptysis.  CARDIOVASCULAR: No chest pain, orthopnea, edema.  GASTROINTESTINAL: No nausea, vomiting, diarrhea or abdominal pain.  GENITOURINARY: No dysuria, hematuria.  ENDOCRINE: No polyuria, nocturia,  HEMATOLOGY: No anemia, easy bruising or bleeding SKIN: Patient has a diabetic foot ulcer in the right foot. He has bilateral lymphedema. Right lower extremity is erythematous MUSCULOSKELETAL: No joint pain or arthritis.   NEUROLOGIC: No tingling, numbness, weakness.  PSYCHIATRY: No anxiety or depression.   MEDICATIONS AT HOME:   Prior to Admission medications   Medication Sig Start Date End Date Taking? Authorizing Provider  albuterol (PROVENTIL HFA;VENTOLIN HFA) 108 (90 Base) MCG/ACT inhaler Inhale 2 puffs into the lungs every 4 (four) hours as needed for wheezing or shortness of breath.   Yes Historical Provider, MD  aspirin EC 81 MG tablet Take 81 mg by mouth every evening.   Yes Historical Provider, MD  atorvastatin (LIPITOR) 40 MG tablet Take 40 mg by mouth every evening.   Yes Historical Provider, MD  ciprofloxacin (CIPRO) 500 MG tablet Take 1 tablet (500 mg total) by mouth 2 (two) times daily. 01/12/16  Yes Sharman Cheek, MD  clindamycin (CLEOCIN) 150 MG capsule Take 3 capsules (450 mg total) by mouth 3 (three) times daily. 01/12/16  Yes Sharman Cheek, MD  clopidogrel (PLAVIX) 75 MG tablet Take 75 mg by mouth every evening.   Yes  Historical Provider, MD  collagenase (SANTYL) ointment Apply 1 application topically 2 (two) times daily.   Yes Historical Provider, MD  furosemide (LASIX) 40 MG tablet Take 40 mg by mouth 2 (two) times daily.   Yes Historical Provider, MD  insulin NPH-regular Human (NOVOLIN 70/30) (70-30) 100 UNIT/ML injection Inject 50-55 Units into the skin 2 (two) times daily. Pt uses 55 units in the morning and 50 at night.   Yes Historical Provider, MD   linagliptin (TRADJENTA) 5 MG TABS tablet Take 5 mg by mouth every evening.    Yes Historical Provider, MD  lisinopril (PRINIVIL,ZESTRIL) 10 MG tablet Take 10 mg by mouth every evening.   Yes Historical Provider, MD  metFORMIN (GLUCOPHAGE) 1000 MG tablet Take 1,000 mg by mouth 2 (two) times daily.   Yes Historical Provider, MD  potassium chloride (K-DUR,KLOR-CON) 10 MEQ tablet Take 10 mEq by mouth every evening.   Yes Historical Provider, MD  Umeclidinium-Vilanterol (ANORO ELLIPTA) 62.5-25 MCG/INH AEPB Inhale 1 puff into the lungs every evening.    Yes Historical Provider, MD      VITAL SIGNS:  Blood pressure 147/82, pulse 95, temperature 98.2 F (36.8 C), temperature source Oral, resp. rate 20, height  (1.803 m), weight 113.399 kg (250 lb), SpO2 97 %.  PHYSICAL EXAMINATION:  GENERAL:  60 y.o.-year-old morbidly obese patient lying in the bed with no acute distress.  EYES: Pupils equal, round, reactive to light and accommodation. No scleral icterus. Extraocular muscles intact.  HEENT: Head atraumatic, normocephalic. Oropharynx and nasopharynx clear.  NECK:  Supple, no jugular venous distention. No thyroid enlargement, no tenderness.  LUNGS: Normal breath sounds bilaterally, no wheezing, rales,rhonchi or crepitation. No use of accessory muscles of respiration.  CARDIOVASCULAR: Distant heart sounds S1, S2 normal. No murmurs, rubs, or gallops.  ABDOMEN: Soft, nontender, nondistended. Bowel sounds present. Hard to appreciate organomegaly or mass.  EXTREMITIES: No cyanosis, or clubbing. 3+ pitting edema bilaterally NEUROLOGIC: Cranial nerves II through XII are grossly intact. No focal deficits. PSYCHIATRIC: The patient is alert and oriented x 3.  SKIN: right foot Dm ulcer lateral part of foot open with area of necrosis/??gangrene He has lymphedema and changes associated with it  LABORATORY PANEL:   CBC  Recent Labs Lab 01/14/16 1029  WBC 12.1*  HGB 13.8  HCT 41.1  PLT 255    ------------------------------------------------------------------------------------------------------------------  Chemistries   Recent Labs Lab 01/14/16 1029  NA 127*  K 4.5  CL 90*  CO2 26  GLUCOSE 238*  BUN 9  CREATININE 0.79  CALCIUM 8.5*   ------------------------------------------------------------------------------------------------------------------  Cardiac Enzymes No results for input(s): TROPONINI in the last 168 hours. ------------------------------------------------------------------------------------------------------------------  RADIOLOGY:  Dg Foot Complete Right  01/12/2016  CLINICAL DATA:  Right foot pain and wound. EXAM: RIGHT FOOT COMPLETE - 3+ VIEW COMPARISON:  12/28/2015 FINDINGS: Soft tissue swelling in the forefoot especially distally. No bony destructive findings characteristic of osteomyelitis are identified. No obvious gas tracking in the soft tissues. Suspected ulceration adjacent to the base of the fifth metatarsal. Dorsal spurring of the talar head. IMPRESSION: 1. Continued soft tissue swelling in the forefoot. I do not see definite gas tracking in the soft tissues and no bony destructive findings characteristic of osteomyelitis are identified. 2. Suspected ulceration adjacent to the base the fifth metatarsal. 3. If further imaging workup for osteomyelitis or abscess is identified, consider MRI. Electronically Signed   By: Gaylyn Rong M.D.   On: 01/12/2016 13:14    EKG:    IMPRESSION AND  PLAN:   70 -year-old male with a history of diabetes, lymphedema and tobacco dependence who presents with a diabetic foot ulcer.  1. Diabetic foot ulcer right foot: Patient's ulcer is not healing and therefore his clinician asked him to come to the ER for further evaluation. Patient needs a podiatry consultation. I will continue IV vancomycin and Zosyn. I have asked for a wound culture. Further recommendations after podiatry consultation. Wound nurse consult  has also been placed. Right foot x-ray is from the 24th as stated above. MRI can be ordered if recommended by podiatry. He was on ciprofloxacin and clindamycin as an outpatient.  2. Diabetes: I will continue his outpatient medications. Start ADA diet and sliding scale insulin. I will order hemoglobin A1c. Diabetes coordinator consultation has been placed.  3. Tobacco dependence: Patient is encouraged assessment patient says he is trying to stop smoking. He is smoking half pack a day. Patient was counseled for 3 minutes. Nicotine patch will be ordered.  4. Morbid obesity: Patient is encouraged to lose weight with diet and exercise as tolerated.  5. Chronic lymphedema: Continue Lasix  6. Essential hypertension: Continue lisinopril and monitor blood pressure.    All the records are reviewed and case discussed with ED provider. Management plans discussed with the patient and he is in agreement.  CODE STATUS: LIMITED NO MECHANICAL VENTILATION  TOTAL TIME TAKING CARE OF THIS PATIENT: 50 minutes.    Laron Boorman M.D on 01/14/2016 at 12:52 PM  Between 7am to 6pm - Pager - [phone] After 6pm go to [email or web address] - password [email or web address] [facility]  [facility]  Office  [phone]  CC: Primary care physician; Leanna Sato, MD

## 2016-01-14 NOTE — ED Provider Notes (Addendum)
[facility] Emergency Department Provider Note  ____________________________________________   I have reviewed the triage vital signs and the nursing notes.   HISTORY  Chief Complaint Wound Infection    HPI Dennis Fitzgerald is a 60 y.o. male with a history of poorly controlled diabetes chronic lymphedema, and a foot ulcer that's been there for partially 7 weeks. He has been on antibiotics. He follows with the wound center.He has noted that his blood sugars have been normal for him. Usually in the 100-150 range. The patient has had no fever or chills, he does have chronic lymphedema in that area. He states he has had studies that suggests she has good flow. The reason he is here today is because he has had increased smell and drainage from that area and his primary care doctor and his wound care doctor thought he needed to be admitted for IV antibiotics. He was here 2 days ago, but declined offered admission at that time because he had to arrange his pets. He denies any trauma or fever.  Past Medical History  Diagnosis Date  . Diabetes mellitus without complication (HCC)   . Lymphedema     There are no active problems to display for this patient.   No past surgical history on file.  Current Outpatient Rx  Name  Route  Sig  Dispense  Refill  . albuterol (PROVENTIL HFA;VENTOLIN HFA) 108 (90 Base) MCG/ACT inhaler   Inhalation   Inhale 2 puffs into the lungs every 4 (four) hours as needed for wheezing or shortness of breath.         Marland Kitchen aspirin EC 81 MG tablet   Oral   Take 81 mg by mouth every evening.         Marland Kitchen atorvastatin (LIPITOR) 40 MG tablet   Oral   Take 40 mg by mouth every evening.         . ciprofloxacin (CIPRO) 500 MG tablet   Oral   Take 1 tablet (500 mg total) by mouth 2 (two) times daily.   20 tablet   0   . clindamycin (CLEOCIN) 150 MG capsule   Oral   Take 3 capsules (450 mg total) by mouth 3 (three) times daily.   90 capsule    0   . clopidogrel (PLAVIX) 75 MG tablet   Oral   Take 75 mg by mouth every evening.         . collagenase (SANTYL) ointment   Topical   Apply 1 application topically 2 (two) times daily.         . furosemide (LASIX) 40 MG tablet   Oral   Take 40 mg by mouth 2 (two) times daily.         . insulin NPH-regular Human (NOVOLIN 70/30) (70-30) 100 UNIT/ML injection   Subcutaneous   Inject 50-55 Units into the skin 2 (two) times daily. Pt uses 55 units in the morning and 50 at night.         . linagliptin (TRADJENTA) 5 MG TABS tablet   Oral   Take 5 mg by mouth every evening.          Marland Kitchen lisinopril (PRINIVIL,ZESTRIL) 10 MG tablet   Oral   Take 10 mg by mouth every evening.         . metFORMIN (GLUCOPHAGE) 1000 MG tablet   Oral   Take 1,000 mg by mouth 2 (two) times daily.         . potassium  chloride (K-DUR,KLOR-CON) 10 MEQ tablet   Oral   Take 10 mEq by mouth every evening.         Marland Kitchen Umeclidinium-Vilanterol (ANORO ELLIPTA) 62.5-25 MCG/INH AEPB   Inhalation   Inhale 1 puff into the lungs every evening.            Allergies Review of patient's allergies indicates no known allergies.  No family history on file.  Social History Social History  Substance Use Topics  . Smoking status: Current Every Day Smoker  . Smokeless tobacco: Not on file  . Alcohol Use: No    Review of Systems Constitutional: No fever/chills Eyes: No visual changes. ENT: No sore throat. No stiff neck no neck pain Cardiovascular: Denies chest pain. Respiratory: Denies shortness of breath. Gastrointestinal:   no vomiting.  No diarrhea.  No constipation. Genitourinary: Negative for dysuria. Musculoskeletal: Positive for lower extremity swelling Skin: Negative for rash. Neurological: Negative for headaches, focal weakness or numbness. 10-point ROS otherwise negative.  ____________________________________________   PHYSICAL EXAM:  VITAL SIGNS: ED Triage Vitals  Enc Vitals  Group     BP 01/14/16 1015 141/77 mmHg     Pulse Rate 01/14/16 1015 106     Resp 01/14/16 1015 20     Temp 01/14/16 1015 98.2 F (36.8 C)     Temp Source 01/14/16 1015 Oral     SpO2 01/14/16 1015 96 %     Weight 01/14/16 1015 250 lb (113.399 kg)     Height 01/14/16 1015  (1.803 m)     Head Cir --      Peak Flow --      Pain Score 01/14/16 1016 7     Pain Loc --      Pain Edu? --      Excl. in GC? --     Constitutional: Alert and oriented. Well appearing and in no acute distress. Eyes: Conjunctivae are normal. PERRL. EOMI. Head: Atraumatic. Nose: No congestion/rhinnorhea. Mouth/Throat: Mucous membranes are moist.  Oropharynx non-erythematous. Neck: No stridor.   Nontender with no meningismus Cardiovascular: Normal rate, regular rhythm. Grossly normal heart sounds.  Good peripheral circulation. Respiratory: Normal respiratory effort.  No retractions. Lungs CTAB. Abdominal: Soft and nontender. No distention. No guarding no rebound Back:  There is no focal tenderness or step off there is no midline tenderness there are no lesions noted. there is no CVA tenderness Musculoskeletal: No lower extremity tenderness. No joint effusions, no DVT signs strong distal pulses symmetric edema is noted Neurologic:  Normal speech and language. No gross focal neurologic deficits are appreciated.  Skin:  Is diffuse erythema noted to the right lower extremity anterior tibial region especially she is blanchable. Patient states this is chronic for him. There is a foot ulcer in the right lateral foot shows a partially 5 cm necrotic. There is good capillary refill in toes. There is good pulses. There is a letter from the necrotic foot ulcer. Psychiatric: Mood and affect are normal. Speech and behavior are normal.  ____________________________________________   LABS (all labs ordered are listed, but only abnormal results are displayed)  Labs Reviewed  BASIC METABOLIC PANEL - Abnormal; Notable for  the following:    Sodium 127 (*)    Chloride 90 (*)    Glucose, Bld 238 (*)    Calcium 8.5 (*)    All other components within normal limits  CBC - Abnormal; Notable for the following:    WBC 12.1 (*)    RDW 15.1 (*)  All other components within normal limits  CBG MONITORING, ED   ____________________________________________  EKG  I personally interpreted any EKGs ordered by me or triage  ____________________________________________  RADIOLOGY  I reviewed any imaging ordered by me or triage that were performed during my shift ____________________________________________   PROCEDURES  Procedure(s) performed: None  Critical Care performed: None  ____________________________________________   INITIAL IMPRESSION / ASSESSMENT AND PLAN / ED COURSE  Pertinent labs & imaging results that were available during my care of the patient were reviewed by me and considered in my medical decision making (see chart for details).  Patient here for admission for his necrotic foot ulcer. We will give him IV antibiotics, air is no crepitus or evidence of acute gas gangrene but we'll obtain an x-ray. Patient will be admitted to the hospitalist  ----------------------------------------- 2:31 PM on 01/14/2016 -----------------------------------------  Hospitals made aware of possible early osteo on x-ray. ____________________________________________   FINAL CLINICAL IMPRESSION(S) / ED DIAGNOSES  Final diagnoses:  Wound infection (HCC)     Jeanmarie Plant, MD 01/14/16 1245  Jeanmarie Plant, MD 01/14/16 1431  Jeanmarie Plant, MD 01/14/16 1431

## 2016-01-14 NOTE — Progress Notes (Signed)
Educated on safety precaution but Refused bed alarm.

## 2016-01-14 NOTE — ED Notes (Signed)
Pt reports has had an infection on his right foot since the first week of December. Pt reports was seen in the ED on 1/24 and was to be admitted but he could not stay at that time. Pt reports lives alone and had to take care of some things before admission. Pt reports is back for admission now.

## 2016-01-15 ENCOUNTER — Inpatient Hospital Stay: Payer: Medicaid Other

## 2016-01-15 LAB — GLUCOSE, CAPILLARY
GLUCOSE-CAPILLARY: 103 mg/dL — AB (ref 65–99)
GLUCOSE-CAPILLARY: 122 mg/dL — AB (ref 65–99)
GLUCOSE-CAPILLARY: 172 mg/dL — AB (ref 65–99)
GLUCOSE-CAPILLARY: 108 mg/dL — AB (ref 65–99)

## 2016-01-15 LAB — BASIC METABOLIC PANEL
Anion gap: 7 (ref 5–15)
BUN: 8 mg/dL (ref 6–20)
CHLORIDE: 96 mmol/L — AB (ref 101–111)
CO2: 27 mmol/L (ref 22–32)
Calcium: 8.2 mg/dL — ABNORMAL LOW (ref 8.9–10.3)
Creatinine, Ser: 0.68 mg/dL (ref 0.61–1.24)
GFR calc Af Amer: >60 mL/min (ref >60)
GLUCOSE: 93 mg/dL (ref 65–99)
POTASSIUM: 3.4 mmol/L — AB (ref 3.5–5.1)
Sodium: 130 mmol/L — ABNORMAL LOW (ref 135–145)

## 2016-01-15 LAB — CBC
HEMATOCRIT: 38.5 % — AB (ref 40.0–52.0)
Hemoglobin: 13 g/dL (ref 13.0–18.0)
MCH: 28.4 pg (ref 26.0–34.0)
MCHC: 33.8 g/dL (ref 32.0–36.0)
MCV: 83.9 fL (ref 80.0–100.0)
Platelets: 232 10*3/uL (ref 150–440)
RBC: 4.59 MIL/uL (ref 4.40–5.90)
RDW: 14.9 % — AB (ref 11.5–14.5)
WBC: 9.3 10*3/uL (ref 3.8–10.6)

## 2016-01-15 MED ORDER — INSULIN ASPART PROT & ASPART (70-30 MIX) 100 UNIT/ML ~~LOC~~ SUSP
45.0000 [IU] | Freq: Two times a day (BID) | SUBCUTANEOUS | Status: DC
  Administered 2016-01-15 – 2016-01-19 (×6): 45 [IU] via SUBCUTANEOUS
  Filled 2016-01-15 (×7): qty 45

## 2016-01-15 MED ORDER — GADOBENATE DIMEGLUMINE 529 MG/ML IV SOLN
20.0000 mL | Freq: Once | INTRAVENOUS | Status: AC | PRN
  Administered 2016-01-15: 20 mL via INTRAVENOUS

## 2016-01-15 NOTE — Progress Notes (Signed)
Inpatient Diabetes Program Recommendations  AACE/ADA: New Consensus Statement on Inpatient Glycemic Control (2015)  Target Ranges:  Prepandial:   less than 140 mg/dL      Peak postprandial:   less than 180 mg/dL (1-2 hours)      Critically ill patients:  140 - 180 mg/dL   Review of Glycemic Control  Results for LINN, GOETZE (MRN [id number]) as of 01/15/2016 08:25  Ref. Range 01/14/2016 16:43 01/14/2016 21:48 01/15/2016 07:29  Glucose-Capillary Latest Ref Range: 65-99 mg/dL 409 (H) 70 811 (H)    Diabetes history: Type 2 Outpatient Diabetes medications: Novoloin 70/30 55 units qam, 50 units qpm, Metformin  bid, Tradgenta /day Current orders for Inpatient glycemic control: Novolog 70/30 55 units bid, Tradjenta /day, Novolog 0-20 units tid, Novolog 0-5 units qhs  Inpatient Diabetes Program Recommendations: Please consider decreasing Novolog 70/30 to 45 units bid when he is on a full diet.    Susette Racer, RN, BA, MHA, CDE Diabetes Coordinator Inpatient Diabetes Program  [phone] (Team Pager) [phone] [facility] Office) 01/15/2016 8:25 AM

## 2016-01-15 NOTE — Progress Notes (Signed)
[facility] - [place] at [facility]   PATIENT NAME: Dennis Fitzgerald    MR#:  [id number]  DATE OF BIRTH:  06-29-1956  SUBJECTIVE:   Patient is hungry. No acute issues overnight. Patient is tolerating IV antibiotics  REVIEW OF SYSTEMS:    Review of Systems  Constitutional: Negative for fever, chills and malaise/fatigue.  HENT: Negative for sore throat.   Eyes: Negative for blurred vision.  Respiratory: Negative for cough, hemoptysis, shortness of breath and wheezing.   Cardiovascular: Negative for chest pain, palpitations and leg swelling.  Gastrointestinal: Negative for nausea, vomiting, abdominal pain, diarrhea and blood in stool.  Genitourinary: Negative for dysuria.  Musculoskeletal: Negative for back pain.  Skin:       Large diabetic foot ulcer on the plantar aspect of the right foot  Neurological: Negative for dizziness, tremors and headaches.  Endo/Heme/Allergies: Does not bruise/bleed easily.    Tolerating Diet: Yes      DRUG ALLERGIES:  No Known Allergies  VITALS:  Blood pressure 135/63, pulse 91, temperature 98.3 F (36.8 C), temperature source Oral, resp. rate 24, height  (1.803 m), weight 113.399 kg (250 lb), SpO2 100 %.  PHYSICAL EXAMINATION:   Physical Exam  Constitutional: He is oriented to person, place, and time and well-developed, well-nourished, and in no distress. No distress.  Obese  HENT:  Head: Normocephalic.  Eyes: No scleral icterus.  Neck: Normal range of motion. Neck supple. No JVD present. No tracheal deviation present.  Cardiovascular: Normal rate, regular rhythm and normal heart sounds.  Exam reveals no gallop and no friction rub.   No murmur heard. Pulmonary/Chest: Effort normal and breath sounds normal. No respiratory distress. He has no wheezes. He has no rales. He exhibits no tenderness.  Abdominal: Soft. Bowel sounds are normal. He exhibits no distension and no mass. There is no tenderness. There is no  rebound and no guarding.  Musculoskeletal: Normal range of motion. He exhibits edema.  Neurological: He is alert and oriented to person, place, and time.  Skin: Skin is warm. There is erythema.  Large diabetic foot ulcer on the right foot plantar aspect does not probe to bone  Psychiatric: Affect and judgment normal.      LABORATORY PANEL:   CBC  Recent Labs Lab 01/15/16 0451  WBC 9.3  HGB 13.0  HCT 38.5*  PLT 232   ------------------------------------------------------------------------------------------------------------------  Chemistries   Recent Labs Lab 01/15/16 0451  NA 130*  K 3.4*  CL 96*  CO2 27  GLUCOSE 93  BUN 8  CREATININE 0.68  CALCIUM 8.2*   ------------------------------------------------------------------------------------------------------------------  Cardiac Enzymes No results for input(s): TROPONINI in the last 168 hours. ------------------------------------------------------------------------------------------------------------------  RADIOLOGY:  Dg Foot Complete Right  01/14/2016  CLINICAL DATA:  History of poorly controlled diabetes, chronic lymphedema, dorsal foot ulcer for 7 weeks. Currently on antibiotics but with persistent symptoms. EXAM: RIGHT FOOT COMPLETE - 3+ VIEW COMPARISON:  Right foot plain film dated 01/12/2016 and right foot plain film dated 12/28/2015. FINDINGS: Again noted is an apparent soft tissue defect overlying the right fifth metatarsal base. Compared to earlier plain film from 12/28/2015, there appear to be developing demineralization within the fifth metatarsal base and developing defects along the lateral cortex margin of the fifth metatarsal base, highly suspicious for developing osteomyelitis. Remainder of the osseous structures of the right foot appear intact and normal in mineralization. No soft tissue gas seen. IMPRESSION: 1. Presumed soft tissue defect/ulceration overlying the base of the right fifth metatarsal  base.  2. Probable developing demineralization and cortex discontinuity along the lateral margin of the underlying fifth metatarsal base, highly suspicious for developing osteomyelitis. Would consider MRI for further characterization. These results were called by telephone at the time of interpretation on 01/14/2016 at 1:34 pm to Dr. Ileana Fitzgerald , who verbally acknowledged these results. Electronically Signed   By: Bary Richard M.D.   On: 01/14/2016 13:36     ASSESSMENT AND PLAN:   60 -year-old male with a history of diabetes, lymphedema and tobacco dependence who presents with a diabetic foot ulcer.  1. Diabetic foot ulcer right foot: Patient's ulcer is not healing and therefore his clinician asked him to come to the ER for further evaluation.  Discussed with Dr. Orland Fitzgerald. Continue IV vancomycin and Zosyn. MRI of the foot to evaluate osteomyelitis. Wound care consult and wound culture ordered. Vascular surgery Place for PAD/PVD. 2. Diabetes: Appreciate diabetes coordinator consult. Continue sliding scale insulin, ADA diet, tradjenta and NovoLog 70/3045 units twice a day.   3. Tobacco dependence: Patient is encouraged assessment patient says he is trying to stop smoking. Patient was counseled on admission.   4. Morbid obesity: Patient is encouraged to lose weight with diet and exercise as tolerated.  5. Chronic lymphedema: Continue Lasix  6. Essential hypertension: Continue lisinopril and monitor blood pressure.  7. Peripheral arterial disease: Hold aspirin and Plavix if patient will go to surgery.  8. COPD: Continue Spiriva and Serevent. Management plans discussed with the patient and he is in agreement.  CODE STATUS: FULL  TOTAL TIME TAKING CARE OF THIS PATIENT: 31 minutes.   Discussed with Dr. Orland Fitzgerald  POSSIBLE D/C later next week DEPENDING ON CLINICAL CONDITION.   Dennis Fitzgerald M.D on 01/15/2016 at 11:40 AM  Between 7am to 6pm - Pager - [phone] After 6pm go to [email or web address]  - password [email or web address] [facility]  [facility]  Office  [phone]  CC: Primary care physician; Dennis Sato, MD  Note: This dictation was prepared with Dragon dictation along with smaller phrase technology. Any transcriptional errors that result from this process are unintentional.

## 2016-01-15 NOTE — Consult Note (Signed)
[facility] Infectious Disease     Reason for Consult:DM foot infection   Referring Physician: Bettey Costa Date of Admission:  01/14/2016   Active Problems:   Diabetic foot ulcer (HCC)   HPI: Dennis Fitzgerald is a 60 y.o. male with DM, chronic lymphedema, tobacco abuse, PAD s/p bypass on L admitted with worsening of R LE foot wound.  HAd been following with wound center.  At admit had wbc 13.6, wound cx pending. Started on IV vanco zosyn and see by podiatry. 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   He has been on oral abx without improvement. Had been following with wound clinic here.  Does continue to smoke.  Was seen in [facility] 1/13 - note below BLE PAD s/p LLE vein bypass favorable graft scan today. New Rt foot ulcer present ~ 2-3 wks, does not recall injury following locally. RLE PAD with SFA stenosis 50-75% GTP 38, strong DP and PT signals. Should be able to heal wound but will have f/u with Dr. Haynes Kerns in wound clinic in 2 wks for evaluation need for surgical debridement/revascularization. Until then he will follow with local wound clinic.     Past Medical History  Diagnosis Date  . Diabetes mellitus without complication ([place])   . Lymphedema   . COPD (chronic obstructive pulmonary disease) ([place])    History reviewed. No pertinent past surgical history. Social History  Substance Use Topics  . Smoking status: Current Every Day Smoker  . Smokeless tobacco: None  . Alcohol Use: No   History reviewed. No pertinent family history.  Allergies: No Known Allergies  Current antibiotics: Antibiotics Given (last 72 hours)    Date/Time Action Medication Dose Rate   01/14/16 1944 Given   vancomycin (VANCOCIN) IVPB 1000 mg/200 mL premix 1,000 mg 200 mL/hr   01/14/16 1944 Given   piperacillin-tazobactam (ZOSYN) IVPB 3.375 g 3.375 g 12.5 mL/hr   01/15/16 0500 Given   vancomycin (VANCOCIN) IVPB 1000 mg/200 mL premix 1,000 mg 200 mL/hr   01/15/16 0608 Given    piperacillin-tazobactam (ZOSYN) IVPB 3.375 g 3.375 g 12.5 mL/hr   01/15/16 1145 Given   vancomycin (VANCOCIN) IVPB 1000 mg/200 mL premix 1,000 mg 200 mL/hr   01/15/16 1500 Given   piperacillin-tazobactam (ZOSYN) IVPB 3.375 g 3.375 g 12.5 mL/hr      MEDICATIONS: . atorvastatin  40 mg Oral QPM  . enoxaparin (LOVENOX) injection  40 mg Subcutaneous Q24H  . furosemide  40 mg Oral BID  . insulin aspart  0-20 Units Subcutaneous TID WC  . insulin aspart  0-5 Units Subcutaneous QHS  . insulin aspart protamine- aspart  45 Units Subcutaneous BID WC  . linagliptin  5 mg Oral QPM  . lisinopril  10 mg Oral QPM  . piperacillin-tazobactam (ZOSYN)  IV  3.375 g Intravenous 3 times per day  . potassium chloride  10 mEq Oral QPM  . salmeterol  1 puff Inhalation BID  . tiotropium  18 mcg Inhalation Daily  . vancomycin  1,000 mg Intravenous Q8H    Review of Systems - 11 systems reviewed and negative per HPI   OBJECTIVE: Temp:  [98.1 F (36.7 C)-98.3 F (36.8 C)] 98.1 F (36.7 C) (01/27 1300) Pulse Rate:  [88-91] 88 (01/27 1300) Resp:  [18-24] 18 (01/27 1300) BP: (130-135)/(62-63) 130/62 mmHg (01/27 1300) SpO2:  [99 %-100 %] 99 % (01/27 1300) Physical Exam  Constitutional: dishelved, obese No distress.  HENT: anicteric  Mouth/Throat: Oropharynx is clear  and moist. No oropharyngeal exudate.  Cardiovascular: Normal rate, regular rhythm  Pulmonary/Chest: Effort normal and breath sounds normal. No respiratory distress. He has no wheezes.  Abdominal: Soft. Bowel sounds are normal. He exhibits no distension. There is no tenderness.  Lymphadenopathy: He has no cervical adenopathy.  Neurological: He is alert and oriented to person, place, and time.  Ext impressive bilateral LE lymphedema Skin: RLE with impressive wound on lateral foot with necrotic tissue and foul odor Psychiatric: He has a normal mood and affect. His behavior is normal.    LABS: Results for orders placed or performed during the  hospital encounter of 01/14/16 (from the past 48 hour(s))  Basic metabolic panel     Status: Abnormal   Collection Time: 01/14/16 10:29 AM  Result Value Ref Range   Sodium 127 (L) 135 - 145 mmol/L   Potassium 4.5 3.5 - 5.1 mmol/L    Comment: HEMOLYSIS AT THIS LEVEL MAY AFFECT RESULT   Chloride 90 (L) 101 - 111 mmol/L   CO2 26 22 - 32 mmol/L   Glucose, Bld 238 (H) 65 - 99 mg/dL   BUN 9 6 - 20 mg/dL   Creatinine, Ser 0.79 0.61 - 1.24 mg/dL   Calcium 8.5 (L) 8.9 - 10.3 mg/dL   GFR calc non Af Amer >60 >60 mL/min   GFR calc Af Amer >60 >60 mL/min    Comment: (NOTE) The eGFR has been calculated using the CKD EPI equation. This calculation has not been validated in all clinical situations. eGFR's persistently <60 mL/min signify possible Chronic Kidney Disease.    Anion gap 11 5 - 15  CBC     Status: Abnormal   Collection Time: 01/14/16 10:29 AM  Result Value Ref Range   WBC 12.1 (H) 3.8 - 10.6 K/uL   RBC 4.95 4.40 - 5.90 MIL/uL   Hemoglobin 13.8 13.0 - 18.0 g/dL   HCT 41.1 40.0 - 52.0 %   MCV 83.0 80.0 - 100.0 fL   MCH 27.8 26.0 - 34.0 pg   MCHC 33.5 32.0 - 36.0 g/dL   RDW 15.1 (H) 11.5 - 14.5 %   Platelets 255 150 - 440 K/uL  TSH     Status: None   Collection Time: 01/14/16 10:29 AM  Result Value Ref Range   TSH 1.776 0.350 - 4.500 uIU/mL  Glucose, capillary     Status: Abnormal   Collection Time: 01/14/16  4:43 PM  Result Value Ref Range   Glucose-Capillary 138 (H) 65 - 99 mg/dL  Wound culture     Status: None (Preliminary result)   Collection Time: 01/14/16  7:16 PM  Result Value Ref Range   Specimen Description FOOT    Special Requests NONE    Gram Stain      FEW WBC SEEN RARE GRAM POSITIVE COCCI IN PAIRS FEW GRAM VARIABLE ROD    Culture TOO YOUNG TO READ    Report Status PENDING   Glucose, capillary     Status: None   Collection Time: 01/14/16  9:48 PM  Result Value Ref Range   Glucose-Capillary 70 65 - 99 mg/dL  Basic metabolic panel     Status: Abnormal    Collection Time: 01/15/16  4:51 AM  Result Value Ref Range   Sodium 130 (L) 135 - 145 mmol/L   Potassium 3.4 (L) 3.5 - 5.1 mmol/L   Chloride 96 (L) 101 - 111 mmol/L   CO2 27 22 - 32 mmol/L   Glucose, Bld 93 65 -  99 mg/dL   BUN 8 6 - 20 mg/dL   Creatinine, Ser 0.68 0.61 - 1.24 mg/dL   Calcium 8.2 (L) 8.9 - 10.3 mg/dL   GFR calc non Af Amer >60 >60 mL/min   GFR calc Af Amer >60 >60 mL/min    Comment: (NOTE) The eGFR has been calculated using the CKD EPI equation. This calculation has not been validated in all clinical situations. eGFR's persistently <60 mL/min signify possible Chronic Kidney Disease.    Anion gap 7 5 - 15  CBC     Status: Abnormal   Collection Time: 01/15/16  4:51 AM  Result Value Ref Range   WBC 9.3 3.8 - 10.6 K/uL   RBC 4.59 4.40 - 5.90 MIL/uL   Hemoglobin 13.0 13.0 - 18.0 g/dL   HCT 38.5 (L) 40.0 - 52.0 %   MCV 83.9 80.0 - 100.0 fL   MCH 28.4 26.0 - 34.0 pg   MCHC 33.8 32.0 - 36.0 g/dL   RDW 14.9 (H) 11.5 - 14.5 %   Platelets 232 150 - 440 K/uL  Glucose, capillary     Status: Abnormal   Collection Time: 01/15/16  7:29 AM  Result Value Ref Range   Glucose-Capillary 103 (H) 65 - 99 mg/dL  Glucose, capillary     Status: Abnormal   Collection Time: 01/15/16 11:48 AM  Result Value Ref Range   Glucose-Capillary 122 (H) 65 - 99 mg/dL   No components found for: ESR, C REACTIVE PROTEIN MICRO: Recent Results (from the past 720 hour(s))  Culture, blood (routine x 2)     Status: None (Preliminary result)   Collection Time: 01/12/16  1:15 PM  Result Value Ref Range Status   Specimen Description BLOOD LEFT HAND  Final   Special Requests   Final    BOTTLES DRAWN AEROBIC AND ANAEROBIC AERO 4CC ANA 5CC   Culture NO GROWTH 3 DAYS  Final   Report Status PENDING  Incomplete  Culture, blood (routine x 2)     Status: None (Preliminary result)   Collection Time: 01/12/16  1:24 PM  Result Value Ref Range Status   Specimen Description BLOOD RIGHT HAND  Final   Special  Requests   Final    BOTTLES DRAWN AEROBIC AND ANAEROBIC AERO [place] ANA 5CC   Culture NO GROWTH 3 DAYS  Final   Report Status PENDING  Incomplete  Wound culture     Status: None (Preliminary result)   Collection Time: 01/14/16  7:16 PM  Result Value Ref Range Status   Specimen Description FOOT  Final   Special Requests NONE  Final   Gram Stain   Final    FEW WBC SEEN RARE GRAM POSITIVE COCCI IN PAIRS FEW GRAM VARIABLE ROD    Culture TOO YOUNG TO READ  Final   Report Status PENDING  Incomplete    IMAGING: Dg Foot Complete Right  01/14/2016  CLINICAL DATA:  History of poorly controlled diabetes, chronic lymphedema, dorsal foot ulcer for 7 weeks. Currently on antibiotics but with persistent symptoms. EXAM: RIGHT FOOT COMPLETE - 3+ VIEW COMPARISON:  Right foot plain film dated 01/12/2016 and right foot plain film dated 12/28/2015. FINDINGS: Again noted is an apparent soft tissue defect overlying the right fifth metatarsal base. Compared to earlier plain film from 12/28/2015, there appear to be developing demineralization within the fifth metatarsal base and developing defects along the lateral cortex margin of the fifth metatarsal base, highly suspicious for developing osteomyelitis. Remainder of the osseous structures  of the right foot appear intact and normal in mineralization. No soft tissue gas seen. IMPRESSION: 1. Presumed soft tissue defect/ulceration overlying the base of the right fifth metatarsal base. 2. Probable developing demineralization and cortex discontinuity along the lateral margin of the underlying fifth metatarsal base, highly suspicious for developing osteomyelitis. Would consider MRI for further characterization. These results were called by telephone at the time of interpretation on 01/14/2016 at 1:34 pm to Dr. Charlotte Crumb , who verbally acknowledged these results. Electronically Signed   By: Franki Cabot M.D.   On: 01/14/2016 13:36   Dg Foot Complete Right  01/12/2016   CLINICAL DATA:  Right foot pain and wound. EXAM: RIGHT FOOT COMPLETE - 3+ VIEW COMPARISON:  12/28/2015 FINDINGS: Soft tissue swelling in the forefoot especially distally. No bony destructive findings characteristic of osteomyelitis are identified. No obvious gas tracking in the soft tissues. Suspected ulceration adjacent to the base of the fifth metatarsal. Dorsal spurring of the talar head. IMPRESSION: 1. Continued soft tissue swelling in the forefoot. I do not see definite gas tracking in the soft tissues and no bony destructive findings characteristic of osteomyelitis are identified. 2. Suspected ulceration adjacent to the base the fifth metatarsal. 3. If further imaging workup for osteomyelitis or abscess is identified, consider MRI. Electronically Signed   By: Van Clines M.D.   On: 01/12/2016 13:14   Dg Foot Complete Right  12/28/2015  CLINICAL DATA:  Nonhealing wound.  Diabetes. EXAM: RIGHT FOOT COMPLETE - 3+ VIEW COMPARISON:  None. FINDINGS: Soft tissue wound noted along the lateral aspect of the foot. No underlying foreign body . No acute bony or joint abnormality identified. No evidence of fracture or dislocation . IMPRESSION: Soft tissue wound noted along the lateral aspect of foot. No radiopaque foreign body. No acute bony abnormality. Diffuse degenerative change. Electronically Signed   By: Marcello Moores  Register   On: 12/28/2015 12:35    Assessment:   Dennis Fitzgerald is a 60 y.o. male DM, chronic lymphedema, tobacco abuse, PAD s/p bypass on L admitted with worsening of R LE foot wound.  HAd been following with wound center.  At admit had wbc 13.6, wound cx pending. Started on IV vanco zosyn and see by podiatry. 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    Recommendations Cont broad spectrum abx pending culture Await MRI and likley debridement WIll need close fu at [facility] clinic as may need further intervention but their most recent note suggests decent RLE flow Thank you  very much for allowing me to participate in the care of this patient. Please call with questions.   Cheral Marker. Ola Spurr, MD

## 2016-01-15 NOTE — Progress Notes (Signed)
Patient refused the chair alarm and is well aware of the risks involved and insists on the alarm not being on.

## 2016-01-15 NOTE — Consult Note (Signed)
WOC wound consult note Reason for Consult: Neuropathic ulcer to right lateral foot.  X-ray confirms probable developing osteomyelitis.  Podiatry consult in.  Will order conservative therapy and defer to podiatry for ongoing treatment.  Wound type:Infectious/neuropathic. Chronic lymphedema.  Has removable compression garment to left leg in place.  Has brought a removable Surepress stretch garment for the right leg.  Will resume compression once podiatry has evaluated wound and agreed.  Pressure Ulcer POA: Yes Measurement: 6 cm x 4 cm x 1 cm unable to visualize wound bed.  100% necrotic, devitalized tissue, with foul odor.   Wound bed: 100% necrotic tissue with calloused tissue present circumferentially.  Drainage (amount, consistency, odor) Moderate serosanguinous  Foul necrotic odor.  Periwound:Erythema/edema, chronic Dressing procedure/placement/frequency: Cleanse right lateral foot wound with NS and pat gently dry. Will treat conservatively with alginate dressing to wound bed for absorption until podiatry consults. Change daily.  Will not follow at this time.  Please re-consult if needed.  Maple Hudson RN BSN CWON Pager [phone]

## 2016-01-15 NOTE — Consult Note (Signed)
Patient Demographics  Dennis Fitzgerald, is a 60 y.o. male   MRN: [id number]   DOB - 1956-06-30  Admit Date - 01/14/2016    Outpatient Primary MD for the patient is Leanna Sato, MD  Consult requested in the Hospital by Adrian Saran, MD, On 01/15/2016    Reason for consult gangrenous changes to right foot diabetic ulceration   With History of -  Past Medical History  Diagnosis Date  . Diabetes mellitus without complication (HCC)   . Lymphedema   . COPD (chronic obstructive pulmonary disease) (HCC)       History reviewed. No pertinent past surgical history.  in for   Chief Complaint  Patient presents with  . Wound Infection     HPI  Dennis Fitzgerald  is a 60 y.o. male, the patient states for the last 5 weeks is been treated that the wound care center here in [facility]. He states the last week the remaining was fine but he went back to the center yesterday and apparently had worsened and they referred him to the emergency room. He was admitted because of infection and gangrenous changes to the lateral side of his right foot.    Social History Social History  Substance Use Topics  . Smoking status: Current Every Day Smoker  . Smokeless tobacco: Not on file  . Alcohol Use: No     Family History History reviewed. No pertinent family history.   Prior to Admission medications   Medication Sig Start Date End Date Taking? Authorizing Provider  albuterol (PROVENTIL HFA;VENTOLIN HFA) 108 (90 Base) MCG/ACT inhaler Inhale 2 puffs into the lungs every 4 (four) hours as needed for wheezing or shortness of breath.   Yes Historical Provider, MD  aspirin EC 81 MG tablet Take 81 mg by mouth every evening.   Yes Historical Provider, MD  atorvastatin (LIPITOR) 40 MG tablet Take 40 mg by mouth  every evening.   Yes Historical Provider, MD  ciprofloxacin (CIPRO) 500 MG tablet Take 1 tablet (500 mg total) by mouth 2 (two) times daily. 01/12/16  Yes Sharman Cheek, MD  clindamycin (CLEOCIN) 150 MG capsule Take 3 capsules (450 mg total) by mouth 3 (three) times daily. 01/12/16  Yes Sharman Cheek, MD  clopidogrel (PLAVIX) 75 MG tablet Take 75 mg by mouth every evening.   Yes Historical Provider, MD  collagenase (SANTYL) ointment Apply 1 application topically 2 (two) times daily.   Yes Historical Provider, MD  furosemide (LASIX) 40 MG tablet Take 40 mg by mouth 2 (two) times daily.   Yes Historical Provider, MD  insulin NPH-regular Human (NOVOLIN 70/30) (70-30) 100 UNIT/ML injection Inject 50-55 Units into the skin 2 (two) times daily. Pt uses 55 units in the morning and 50 at night.   Yes Historical Provider, MD  linagliptin (TRADJENTA) 5 MG TABS tablet Take 5 mg by mouth every evening.    Yes Historical Provider, MD  lisinopril (PRINIVIL,ZESTRIL) 10 MG tablet Take 10 mg by mouth every evening.  Yes Historical Provider, MD  metFORMIN (GLUCOPHAGE) 1000 MG tablet Take 1,000 mg by mouth 2 (two) times daily.   Yes Historical Provider, MD  potassium chloride (K-DUR,KLOR-CON) 10 MEQ tablet Take 10 mEq by mouth every evening.   Yes Historical Provider, MD  Umeclidinium-Vilanterol (ANORO ELLIPTA) 62.5-25 MCG/INH AEPB Inhale 1 puff into the lungs every evening.    Yes Historical Provider, MD    Anti-infectives    Start     Dose/Rate Route Frequency Ordered Stop   01/14/16 2000  vancomycin (VANCOCIN) IVPB 1000 mg/200 mL premix     1,000 mg 200 mL/hr over 60 Minutes Intravenous Every 8 hours 01/14/16 1636     01/14/16 2000  piperacillin-tazobactam (ZOSYN) IVPB 3.375 g     3.375 g 12.5 mL/hr over 240 Minutes Intravenous 3 times per day 01/14/16 1636     01/14/16 1245  piperacillin-tazobactam (ZOSYN) IVPB 3.375 g     3.375 g 12.5 mL/hr over 240 Minutes Intravenous  Once 01/14/16 1241 01/14/16  1524   01/14/16 1245  vancomycin (VANCOCIN) IVPB 1000 mg/200 mL premix     1,000 mg 200 mL/hr over 60 Minutes Intravenous  Once 01/14/16 1244 01/14/16 1440      Scheduled Meds: . atorvastatin  40 mg Oral QPM  . enoxaparin (LOVENOX) injection  40 mg Subcutaneous Q24H  . furosemide  40 mg Oral BID  . insulin aspart  0-20 Units Subcutaneous TID WC  . insulin aspart  0-5 Units Subcutaneous QHS  . insulin aspart protamine- aspart  45 Units Subcutaneous BID WC  . linagliptin  5 mg Oral QPM  . lisinopril  10 mg Oral QPM  . piperacillin-tazobactam (ZOSYN)  IV  3.375 g Intravenous 3 times per day  . potassium chloride  10 mEq Oral QPM  . salmeterol  1 puff Inhalation BID  . tiotropium  18 mcg Inhalation Daily  . vancomycin  1,000 mg Intravenous Q8H   Continuous Infusions:  PRN Meds:.albuterol, magic mouthwash, morphine injection, oxyCODONE  No Known Allergies  Physical Exam  Vitals  Blood pressure 130/62, pulse 88, temperature 98.1 F (36.7 C), temperature source Oral, resp. rate 18, height 5\' 11"  (1.803 m), weight 113.399 kg (250 lb), SpO2 99 %.  Lower Extremity exam:  Vascular: Patient has severe lymphedema and venous stasis edema and it's hard to palpate the arteries to the right foot. He states that he had recent vascular studies at [facility] where he had previous vascular work done. He states they told him everything was okay and the right foot that would not recommend doing anything differently. He does state that he was told he had a blockage in his groin area. Units stated that he was told this would not affect his distal flow enough to warrant revascularization.  Dermatological: Patient has a large ulceration right lateral plantar fifth metatarsal tuberosity and proximal shaft. Foul odors associated with this and there is a gangrenous appearance to the eschar centrally on the wound. As significant swelling and redness but is in part due to the swelling. Wound is approximately 5 x 4 cm  uncertain as the depth at this point.  Neurological: Significant diabetic neuropathy bilateral  Ortho: Likely has some inversion to the foot and increased pressure across the lateral column as a result. This made in the instigating pressure situation because the ulcer to start. He states he was getting no pressure relief treatment at this timeframe.  Data Review  CBC  Recent Labs Lab 01/12/16 1328 01/14/16 1029 01/15/16 0451  WBC 13.6* 12.1* 9.3  HGB 14.1 13.8 13.0  HCT 42.0 41.1 38.5*  PLT 235 255 232  MCV 83.5 83.0 83.9  MCH 28.1 27.8 28.4  MCHC 33.6 33.5 33.8  RDW 14.8* 15.1* 14.9*  LYMPHSABS 0.9*  --   --   MONOABS 0.8  --   --   EOSABS 0.2  --   --   BASOSABS 0.1  --   --    ------------------------------------------------------------------------------------------------------------------  Chemistries   Recent Labs Lab 01/12/16 1328 01/14/16 1029 01/15/16 0451  NA 128* 127* 130*  K 3.9 4.5 3.4*  CL 94* 90* 96*  CO2 GLUCOSE 308* 238* 93  BUN CREATININE 0.67 0.79 0.68  CALCIUM 8.5* 8.5* 8.2*   ------------------------------------------------------------------------------------------------------------------ estimated creatinine clearance is 127.3 mL/min (by C-G formula based on Cr of 0.68). ------------------------------------------------------------------------------------------------------------------  Recent Labs  01/14/16 1029  TSH 1.776     Imaging results:   Dg Foot Complete Right  01/14/2016  CLINICAL DATA:  History of poorly controlled diabetes, chronic lymphedema, dorsal foot ulcer for 7 weeks. Currently on antibiotics but with persistent symptoms. EXAM: RIGHT FOOT COMPLETE - 3+ VIEW COMPARISON:  Right foot plain film dated 01/12/2016 and right foot plain film dated 12/28/2015. FINDINGS: Again noted is an apparent soft tissue defect overlying the right fifth metatarsal base. Compared to earlier plain film from 12/28/2015, there  appear to be developing demineralization within the fifth metatarsal base and developing defects along the lateral cortex margin of the fifth metatarsal base, highly suspicious for developing osteomyelitis. Remainder of the osseous structures of the right foot appear intact and normal in mineralization. No soft tissue gas seen. IMPRESSION: 1. Presumed soft tissue defect/ulceration overlying the base of the right fifth metatarsal base. 2. Probable developing demineralization and cortex discontinuity along the lateral margin of the underlying fifth metatarsal base, highly suspicious for developing osteomyelitis. Would consider MRI for further characterization. These results were called by telephone at the time of interpretation on 01/14/2016 at 1:34 pm to Dr. Ileana Roup , who verbally acknowledged these results. Electronically Signed   By: Bary Richard M.D.   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 White count is coming down since yesterday since his been on antibiotics. States recent studies for vascular function in [facility] had shown that he is getting blood flow but he is a daily smoker and diabetic. Wound is been there for approximately month and a half. Because of his neuropathy and probably started before that. Plan: Because of his infection we need to debride and drain this as soon as possible. I will get him set up for surgery tomorrow. He scheduled for an MRI today. I will likely have to remove some infected bone from the fifth metatarsal base which will likely cause him to lose his peroneus brevis tendon attachment. I explained this to him in detail today and told him that further deformity would likely occur and have to be addressed at a later time. He understands that and understands that we need to go ahead and try to debride and reduce the infected tissue  skin and soft tissue and bone from the region in order to have a better chance to heal. We'll keep him nothing by mouth tonight and set up for surgery tomorrow. Consent form orders were written. Would also like vascular input  with this patient when possible. Because of infection of think we need to go ahead and move on the debridement tomorrow.  Active Problems:   Diabetic foot ulcer (HCC)     Family Communication: Plan discussed with patient .   Thank you for the consult, we will follow the patient with you in the Hospital.   Epimenio Sarin M.D on 01/15/2016 at 3:13 PM  Thank you for the consult, we will follow the patient with you in the Hospital.

## 2016-01-16 ENCOUNTER — Encounter
Admission: EM | Disposition: A | Discharge status: Skilled Nursing Facility | Payer: Self-pay | Source: Home / Self Care | Attending: Specialist

## 2016-01-16 ENCOUNTER — Inpatient Hospital Stay: Payer: Medicaid Other | Admitting: Anesthesiology

## 2016-01-16 ENCOUNTER — Encounter: Payer: Self-pay | Admitting: Podiatry

## 2016-01-16 DIAGNOSIS — L97519 Non-pressure chronic ulcer of other part of right foot with unspecified severity: Secondary | ICD-10-CM

## 2016-01-16 DIAGNOSIS — E08621 Diabetes mellitus due to underlying condition with foot ulcer: Secondary | ICD-10-CM

## 2016-01-16 HISTORY — PX: INCISION AND DRAINAGE OF WOUND: SHX1803

## 2016-01-16 HISTORY — PX: APPLICATION OF WOUND VAC: SHX5189

## 2016-01-16 LAB — CBC
HCT: 38.4 % — ABNORMAL LOW (ref 40.0–52.0)
Hemoglobin: 13 g/dL (ref 13.0–18.0)
MCH: 27.9 pg (ref 26.0–34.0)
MCHC: 33.7 g/dL (ref 32.0–36.0)
MCV: 82.6 fL (ref 80.0–100.0)
PLATELETS: 218 10*3/uL (ref 150–440)
RBC: 4.66 MIL/uL (ref 4.40–5.90)
RDW: 14.9 % — AB (ref 11.5–14.5)
WBC: 7.3 10*3/uL (ref 3.8–10.6)

## 2016-01-16 LAB — BASIC METABOLIC PANEL
ANION GAP: 5 (ref 5–15)
BUN: 6 mg/dL (ref 6–20)
CALCIUM: 8.2 mg/dL — AB (ref 8.9–10.3)
CO2: 29 mmol/L (ref 22–32)
Chloride: 99 mmol/L — ABNORMAL LOW (ref 101–111)
Creatinine, Ser: 0.77 mg/dL (ref 0.61–1.24)
GLUCOSE: 97 mg/dL (ref 65–99)
POTASSIUM: 3.5 mmol/L (ref 3.5–5.1)
Sodium: 133 mmol/L — ABNORMAL LOW (ref 135–145)

## 2016-01-16 LAB — GLUCOSE, CAPILLARY
GLUCOSE-CAPILLARY: 116 mg/dL — AB (ref 65–99)
GLUCOSE-CAPILLARY: 208 mg/dL — AB (ref 65–99)
GLUCOSE-CAPILLARY: 146 mg/dL — AB (ref 65–99)
Glucose-Capillary: 112 mg/dL — ABNORMAL HIGH (ref 65–99)

## 2016-01-16 LAB — MRSA PCR SCREENING: MRSA BY PCR: NEGATIVE

## 2016-01-16 LAB — VANCOMYCIN, TROUGH: VANCOMYCIN TR: 14 ug/mL (ref 10–20)

## 2016-01-16 SURGERY — IRRIGATION AND DEBRIDEMENT WOUND
Anesthesia: General | Site: Foot | Laterality: Right

## 2016-01-16 MED ORDER — FENTANYL CITRATE (PF) 100 MCG/2ML IJ SOLN
25.0000 ug | INTRAMUSCULAR | Status: DC | PRN
  Administered 2016-01-16 (×4): 25 ug via INTRAVENOUS

## 2016-01-16 MED ORDER — BUPIVACAINE HCL (PF) 0.5 % IJ SOLN
INTRAMUSCULAR | Status: AC
  Filled 2016-01-16: qty 30

## 2016-01-16 MED ORDER — MIDAZOLAM HCL 2 MG/2ML IJ SOLN
INTRAMUSCULAR | Status: DC | PRN
  Administered 2016-01-16: 2 mg via INTRAVENOUS

## 2016-01-16 MED ORDER — FENTANYL CITRATE (PF) 100 MCG/2ML IJ SOLN
INTRAMUSCULAR | Status: DC | PRN
  Administered 2016-01-16: 100 ug via INTRAVENOUS

## 2016-01-16 MED ORDER — LIDOCAINE HCL (CARDIAC) 20 MG/ML IV SOLN
INTRAVENOUS | Status: DC | PRN
  Administered 2016-01-16: 30 mg via INTRAVENOUS

## 2016-01-16 MED ORDER — FENTANYL CITRATE (PF) 100 MCG/2ML IJ SOLN
INTRAMUSCULAR | Status: AC
  Filled 2016-01-16: qty 2

## 2016-01-16 MED ORDER — GENTAMICIN SULFATE 40 MG/ML IJ SOLN
INTRAMUSCULAR | Status: AC
  Filled 2016-01-16: qty 6

## 2016-01-16 MED ORDER — LACTATED RINGERS IV SOLN
INTRAVENOUS | Status: DC | PRN
  Administered 2016-01-16: 10:00:00 via INTRAVENOUS

## 2016-01-16 MED ORDER — PROPOFOL 10 MG/ML IV BOLUS
INTRAVENOUS | Status: DC | PRN
  Administered 2016-01-16: 150 mg via INTRAVENOUS

## 2016-01-16 MED ORDER — ONDANSETRON HCL 4 MG/2ML IJ SOLN
INTRAMUSCULAR | Status: DC | PRN
  Administered 2016-01-16: 4 mg via INTRAVENOUS

## 2016-01-16 MED ORDER — VANCOMYCIN HCL 1000 MG IV SOLR
INTRAVENOUS | Status: AC
Start: 2016-01-16 — End: 2016-01-16
  Filled 2016-01-16: qty 1000

## 2016-01-16 MED ORDER — BUPIVACAINE HCL (PF) 0.5 % IJ SOLN
INTRAMUSCULAR | Status: DC | PRN
  Administered 2016-01-16: 10 mL

## 2016-01-16 MED ORDER — ONDANSETRON HCL 4 MG/2ML IJ SOLN: 4.0000 mg | Freq: Once | INTRAMUSCULAR | Status: DC | PRN

## 2016-01-16 MED ORDER — VANCOMYCIN HCL 10 G IV SOLR
1250.0000 mg | Freq: Three times a day (TID) | INTRAVENOUS | Status: DC
  Administered 2016-01-16 – 2016-01-18 (×6): 1250 mg via INTRAVENOUS
  Filled 2016-01-16 (×8): qty 1250

## 2016-01-16 SURGICAL SUPPLY — 46 items
BAG COUNTER SPONGE EZ (MISCELLANEOUS) IMPLANT
BANDAGE ELASTIC 3 LF NS (GAUZE/BANDAGES/DRESSINGS) ×4 IMPLANT
BANDAGE ELASTIC 4 LF NS (GAUZE/BANDAGES/DRESSINGS) ×8 IMPLANT
BANDAGE STRETCH 3X4.1 STRL (GAUZE/BANDAGES/DRESSINGS) IMPLANT
BNDG COHESIVE 4X5 WHT NS (GAUZE/BANDAGES/DRESSINGS) ×4 IMPLANT
BNDG ESMARK 4X12 TAN STRL LF (GAUZE/BANDAGES/DRESSINGS) ×4 IMPLANT
BNDG GAUZE 4.5X4.1 6PLY STRL (MISCELLANEOUS) ×8 IMPLANT
CANISTER SUCT 1200ML W/VALVE (MISCELLANEOUS) ×4 IMPLANT
COUNTER SPONGE BAG EZ (MISCELLANEOUS)
CUFF TOURN 18 STER (MISCELLANEOUS) ×4 IMPLANT
CUFF TOURN DUAL PL 12 NO SLV (MISCELLANEOUS) IMPLANT
DRAPE FLUOR MINI C-ARM 54X84 (DRAPES) IMPLANT
DURAPREP 26ML APPLICATOR (WOUND CARE) ×4 IMPLANT
ELECT REM PT RETURN 9FT ADLT (ELECTROSURGICAL) ×4
ELECTRODE REM PT RTRN 9FT ADLT (ELECTROSURGICAL) ×2 IMPLANT
GAUZE PETRO XEROFOAM 1X8 (MISCELLANEOUS) IMPLANT
GAUZE SPONGE 4X4 12PLY STRL (GAUZE/BANDAGES/DRESSINGS) IMPLANT
GLOVE BIO SURGEON STRL SZ8 (GLOVE) ×12 IMPLANT
GLOVE INDICATOR 7.5 STRL GRN (GLOVE) ×4 IMPLANT
GOWN STRL REUS W/ TWL LRG LVL3 (GOWN DISPOSABLE) ×2 IMPLANT
GOWN STRL REUS W/TWL LRG LVL3 (GOWN DISPOSABLE) ×2
HANDPIECE VERSAJET DEBRIDEMENT (MISCELLANEOUS) ×4 IMPLANT
IV NS 1000ML (IV SOLUTION) ×2
IV NS 1000ML BAXH (IV SOLUTION) ×2 IMPLANT
KIT DRSG VAC SLVR GRANUFM (MISCELLANEOUS) ×4 IMPLANT
KIT RM TURNOVER STRD PROC AR (KITS) ×4 IMPLANT
KIT STIMULAN RAPID CURE 5CC (Orthopedic Implant) ×4 IMPLANT
LABEL OR SOLS (LABEL) IMPLANT
NEEDLE FILTER BLUNT 18X 1/2SAF (NEEDLE) ×2
NEEDLE FILTER BLUNT 18X1 1/2 (NEEDLE) ×2 IMPLANT
NEEDLE HYPO 25X1 1.5 SAFETY (NEEDLE) ×8 IMPLANT
NS IRRIG 500ML POUR BTL (IV SOLUTION) ×4 IMPLANT
PACK EXTREMITY ARMC (MISCELLANEOUS) ×4 IMPLANT
PAD ABD DERMACEA PRESS 5X9 (GAUZE/BANDAGES/DRESSINGS) ×4 IMPLANT
PAD NEG PRESSURE SENSATRAC (MISCELLANEOUS) ×4 IMPLANT
STOCKINETTE STRL 6IN 960660 (GAUZE/BANDAGES/DRESSINGS) ×4 IMPLANT
SUT ETHILON 3-0 FS-10 30 BLK (SUTURE) ×4
SUT ETHILON 4-0 (SUTURE) ×2
SUT ETHILON 4-0 FS2 18XMFL BLK (SUTURE) ×2
SUT VIC AB 3-0 SH 27 (SUTURE) ×2
SUT VIC AB 3-0 SH 27X BRD (SUTURE) ×2 IMPLANT
SUT VIC AB 4-0 FS2 27 (SUTURE) ×4 IMPLANT
SUTURE EHLN 3-0 FS-10 30 BLK (SUTURE) ×2 IMPLANT
SUTURE ETHLN 4-0 FS2 18XMF BLK (SUTURE) ×2 IMPLANT
SYR 3ML LL SCALE MARK (SYRINGE) ×8 IMPLANT
SYRINGE 10CC LL (SYRINGE) ×4 IMPLANT

## 2016-01-16 NOTE — Op Note (Signed)
Operative note   Surgeon: Dr. Recardo Evangelist, DPM.    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 Removal of infected fifth metatarsal proximal one half including tuberosity secondary to bone infection right foot   2. Debridement and removal of infected skin and soft tissue from the right foot       EBL: 50 cc    Anesthesia:general with local block    Hemostasis: None    Specimen: Culture of the fifth metatarsal tuberosity bone which was fragmented and diseased was sent for culture and sensitivity. Remaining bone and necrotic tissue was sent to pathology for evaluation.    Complications: None    Operative indications: Chronic gangrenous ulceration approximately 5 cm in length, 4 cm in width and 3-4 cm of depth    Procedure:  Patient was brought into the OR and placed on the operating table in thesupine position. After anesthesia was obtained theright lower extremity was prepped and draped in usual sterile fashion.  Operative Report: At this time attention was directed to the lateral aspect of the right foot. A large gangrenous area of tissue and skin was noted on the lateral fifth metatarsal tuberosity region extending proximally 5 cemented length 47 m in width. This gangrenous eschar was then removed with a 15 blade. Tissue necrosis was seen deep to this as well. A versa jet was utilized this time to remove all infected necrotic tissue from the area of the ulceration. At this point it was noted at the fifth metatarsal was exposed mended at the tuberosity area. A portion of this fragment was retrieved and sent to culture for microbiological culture and sensitivity. At this point an incision was made distally towards the first metatarsal head by proximally 3 cm in addition to the wound. This was deepened and periosteum was identified and incised and  reflected mediolaterally. The damaged infected portion of bone was followed once good hard nice quality bone was encountered a TPS sagittal saw was used to resect the bone at that point and the proximal portion of the bone including tuberosity was removed in toto. Noted the bone fragmented along the tuberosity region secondary to infection. The cuboid bone was inspected and no evidence of bone loss or minerals a she was noted. Articular cartilage appeared to be healthy and in good shape. A sinus track was noted running proximally from the fifth metatarsal tuberosity this was irrigated and the debrided with the versa jet. All deep and superficial tissues including damaged skin was then debrided with versa jet until all infected tissues were removed. At this point the wound was packed proximally centrally and distally with Stimulan beads which had earlier been prepared and impregnated with gentamicin and vancomycin. The solution for bead impregnate she and included 1 g of vancomycin and 240 mg of gentamicin. These were all placed after copious irrigation was achieved with sterile Neosporin solution and copious debridement and irrigation was accomplished with a versa jet. At this time the distal incision was closed with 4 Vicryl simple interrupted sutures and skin was closed distally with 3-0 nylon simple interrupted sutures. The wound size was too large to close proximally and a wound VAC with silver sponge was placed over the open portion of the wound. This was set at 125 mmHg continuous. Padding was placed across the area with ABDs pads and Kerlix and an  Ace wrap. 2 Ace wraps were used to wrap from his foot to his leg try to help control the venous stasis and lymphedema. Tolerated procedure and anesthesia well left the OR to the recovery room with vital signs stable and neurovascular status intact bleeding appeared to be sufficient in the area to hopefully promote healing to the region.    Patient tolerated the  procedure and anesthesia well.  Was transported from the OR to the PACU with all vital signs stable and vascular status intact. To be discharged per routine protocol.  Will follow up in approximately 1 week in the outpatient clinic.

## 2016-01-16 NOTE — Anesthesia Preprocedure Evaluation (Signed)
Anesthesia Evaluation  Patient identified by MRN, date of birth, ID band Patient awake    Reviewed: Allergy & Precautions, Patient's Chart, lab work & pertinent test results  Airway Mallampati: II       Dental  (+) Teeth Intact   Pulmonary COPD,  COPD inhaler, Current Smoker,     + decreased breath sounds      Cardiovascular hypertension, Pt. on medications  Rhythm:Regular Rate:Normal     Neuro/Psych    GI/Hepatic negative GI ROS, Neg liver ROS,   Endo/Other  diabetes, Type 1, Insulin DependentMorbid obesity  Renal/GU      Musculoskeletal   Abdominal (+) + obese,   Peds  Hematology   Anesthesia Other Findings   Reproductive/Obstetrics                             Anesthesia Physical Anesthesia Plan  ASA: III  Anesthesia Plan: General   Post-op Pain Management:    Induction: Intravenous  Airway Management Planned: LMA  Additional Equipment:   Intra-op Plan:   Post-operative Plan: Extubation in OR  Informed Consent: I have reviewed the patients History and Physical, chart, labs and discussed the procedure including the risks, benefits and alternatives for the proposed anesthesia with the patient or authorized representative who has indicated his/her understanding and acceptance.     Plan Discussed with: CRNA  Anesthesia Plan Comments:         Anesthesia Quick Evaluation

## 2016-01-16 NOTE — Progress Notes (Signed)
[facility] [place] at [facility]   PATIENT NAME: Dennis Fitzgerald    MR#:  [id number]  DATE OF BIRTH:  1956/06/29  SUBJECTIVE:    Pt. Here due to a right foot diabetic foot ulcer and noted to have osteomyelitis of the right fifth metatarsal. Patient is status post removal of the infected fifth metatarsal proximal half and debridement of the underlying soft tissue infection. Patient has a wound VAC presently. No other complaints or acute events overnight.  REVIEW OF SYSTEMS:    Review of Systems  Constitutional: Negative for fever and chills.  HENT: Negative for congestion and tinnitus.   Eyes: Negative for blurred vision and double vision.  Respiratory: Negative for cough, shortness of breath and wheezing.   Cardiovascular: Negative for chest pain, orthopnea and PND.  Gastrointestinal: Negative for nausea, vomiting, abdominal pain and diarrhea.  Genitourinary: Negative for dysuria and hematuria.  Neurological: Negative for dizziness, sensory change and focal weakness.  All other systems reviewed and are negative.   Nutrition: Carb modified.  Tolerating Diet: yes Tolerating PT: Await Eval.    DRUG ALLERGIES:  No Known Allergies  VITALS:  Blood pressure 130/68, pulse 90, temperature 97.8 F (36.6 C), temperature source Oral, resp. rate 18, height  (1.803 m), weight 113.399 kg (250 lb), SpO2 95 %.  PHYSICAL EXAMINATION:   Physical Exam  GENERAL:  60 y.o.-year-old patient lying in the bed in no acute distress.  EYES: Pupils equal, round, reactive to light and accommodation. No scleral icterus. Extraocular muscles intact.  HEENT: Head atraumatic, normocephalic. Oropharynx and nasopharynx clear.  NECK:  Supple, no jugular venous distention. No thyroid enlargement, no tenderness.  LUNGS: Normal breath sounds bilaterally, no wheezing, rales, rhonchi. No use of accessory muscles of respiration.  CARDIOVASCULAR: S1, S2 normal. No murmurs, rubs, or  gallops.  ABDOMEN: Soft, nontender, nondistended. Bowel sounds present. No organomegaly or mass.  EXTREMITIES: No cyanosis, clubbing, chronic lymphedema b/l.  Right Lower ext. wound dressing w/ wound vac in place.   NEUROLOGIC: Cranial nerves II through XII are intact. No focal Motor or sensory deficits b/l.   PSYCHIATRIC: The patient is alert and oriented x 3.   SKIN: No obvious rash, lesion, or ulcer.    LABORATORY PANEL:   CBC  Recent Labs Lab 01/16/16 0406  WBC 7.3  HGB 13.0  HCT 38.4*  PLT 218   ------------------------------------------------------------------------------------------------------------------  Chemistries   Recent Labs Lab 01/16/16 0406  NA 133*  K 3.5  CL 99*  CO2 29  GLUCOSE 97  BUN 6  CREATININE 0.77  CALCIUM 8.2*   ------------------------------------------------------------------------------------------------------------------  Cardiac Enzymes No results for input(s): TROPONINI in the last 168 hours. ------------------------------------------------------------------------------------------------------------------  RADIOLOGY:  Mr Foot Right W Wo Contrast  01/16/2016  CLINICAL DATA:  Foot ulcer near the heel, starting in December 2016, worsening recently. Diabetes. EXAM: MRI OF THE RIGHT HINDFOOT WITHOUT AND WITH CONTRAST TECHNIQUE: Multiplanar, multisequence MR imaging was performed both before and after administration of intravenous contrast. CONTRAST:  20mL MULTIHANCE GADOBENATE DIMEGLUMINE 529 MG/ML IV SOLN COMPARISON:  01/14/2016 radiograph FINDINGS: Diffuse subcutaneous edema in the distal calf and ankle noted. There is an ulceration adjacent to the base of fifth metatarsal. Underlying this ulceration there is a 7.6 by 3.9 by 2.0 cm volume of hypoenhancing and likely necrotic tissue. This extends down to the base of the fifth metatarsal which demonstrates diffuse abnormal edema and enhancement compatible with osteomyelitis of the fifth  metatarsal. Edema and enhancement is most severe  at the metatarsal base but extends in the metatarsal shaft towards the metatarsal head. The peroneus brevis appears to still inserts on the edematous base of the fifth metatarsal in the peroneus longus skirts the margin of the necrotic tissue. There is some tissue necrosis and abnormal edema and enhancement in the abductor digiti minimi muscle as it passes below the fifth metatarsal. The area of soft tissue necrosis abuts the cuboid and there is some faintly increased T2 signal and enhancement in this adjacent portion of the cuboid near the peroneus longus tendon which could reflect an early osteomyelitis for example images 11 through 14 of series 15. There is also abnormal edema and enhancement in the bony articular confluence of the navicular, middle and medial cuneiform, and cuboid as shown on images 16 through 23 of series 11. Of these the most severely involved is the navicular. I am uncertain whether this edema and enhancement is due to early osteomyelitis or underlying arthropathy. Lisfranc ligament intact. There is peroneus longus and peroneus brevis tenosynovitis. There is 5 mm non-fragmented osteochondral lesion of the lateral talar dome on image 22 series 14. Cellulitis noted in the foot especially severe in the vicinity of the ulceration. Please note that the region of the metatarsal heads and forefoot was not included on today's exam. IMPRESSION: 1. 7.6 by 3.9 by 2.0 cm volume of hypoenhancing necrotic tissue underlying the ulceration near the base of the fifth metatarsal osteomyelitis of the base of the fifth metatarsal extending to the margin of the fifth metatarsal. Diffuse osteomyelitis in the fifth metatarsal especially severe at its base. Possible early osteomyelitis involving the inferior-lateral cuboid, and also in the navicular. Questionable early osteomyelitis in the middle and medial cuneiforms. 2. Diffuse infiltrative edema in the calf and  ankle compatible with cellulitis. 3. Incidental 5 mm non-fragmented osteochondral lesion of the lateral talar dome. Electronically Signed   By: Gaylyn Rong M.D.   On: 01/16/2016 08:14     ASSESSMENT AND PLAN:   60 year old male with past history of diabetes type 2, chronic lymphedema, COPD, hyperlipidemia, hypertension who presented to the hospital due to right lower extremity diabetic foot ulcer.  #1 right lower extremity diabetic foot ulcer/Osteomyelitis-appreciate podiatry help and patient is status post debridement and partial removal of the infected fifth metatarsal on the right foot. -Continue broad-spectrum IV antibiotics with vancomycin, Zosyn. Follow intraoperative cultures. -Patient will likely need long-term IV antibiotics and we didn't discuss this further with infectious disease. -Continue local wound care presently. -Await vascular surgery input.  #2 type 2 diabetes without complication-continue Novolin 70/30, sliding scale insulin, Tradjenta and follow blood sugars.  #3 hypertension-continue lisinopril  #4 COPD-without acute exacerbation. Continue Spiriva, Serevent.   #5 hyperlipidemia-continue atorvastatin.  All the records are reviewed and case discussed with Care Management/Social Workerr. Management plans discussed with the patient, family and they are in agreement.  CODE STATUS: Full  DVT Prophylaxis: Lovenox  TOTAL TIME TAKING CARE OF THIS PATIENT: 30 minutes.   POSSIBLE D/C IN 1-2 DAYS, DEPENDING ON CLINICAL CONDITION.   Houston Siren M.D on 01/16/2016 at 3:15 PM  Between 7am to 6pm - Pager - [phone]  After 6pm go to [email or web address] - password [email or web address] [facility]  [facility]  Office  [phone]  CC: Primary care physician; Leanna Sato, MD

## 2016-01-16 NOTE — Progress Notes (Signed)
ANTIBIOTIC CONSULT NOTE - INITIAL  Pharmacy Consult for Vancomycin and Zosyn Indication: Diabetic foot ulcer  No Known Allergies  Patient Measurements: Height:  (180.3 cm) Weight: 250 lb (113.399 kg) IBW/kg (Calculated) : 75.3 Adjusted Body Weight: 91 kg  Vital Signs: Temp: 98.2 F (36.8 C) (01/27 2013) Temp Source: Oral (01/27 2013) BP: 142/77 mmHg (01/27 2013) Pulse Rate: 87 (01/27 2013) Intake/Output from previous day: 01/27 0701 - 01/28 0700 In: 1230 [P.O.:980; IV Piggyback:250] Out: 1150 [Urine:1150] Intake/Output from this shift: Total I/O In: -  Out: 850 [Urine:850]  Labs:  Recent Labs  01/14/16 1029 01/15/16 0451 01/16/16 0406  WBC 12.1* 9.3 7.3  HGB 13.8 13.0 13.0  PLT 255 232 218  CREATININE 0.79 0.68 0.77   Estimated Creatinine Clearance: 127.3 mL/min (by C-G formula based on Cr of 0.77).  Recent Labs  01/16/16 0406  VANCOTROUGH 14     Microbiology: Recent Results (from the past 720 hour(Fitzgerald))  Culture, blood (routine x 2)     Status: None (Preliminary result)   Collection Time: 01/12/16  1:15 PM  Result Value Ref Range Status   Specimen Description BLOOD LEFT HAND  Final   Special Requests   Final    BOTTLES DRAWN AEROBIC AND ANAEROBIC AERO 4CC ANA 5CC   Culture NO GROWTH 3 DAYS  Final   Report Status PENDING  Incomplete  Culture, blood (routine x 2)     Status: None (Preliminary result)   Collection Time: 01/12/16  1:24 PM  Result Value Ref Range Status   Specimen Description BLOOD RIGHT HAND  Final   Special Requests   Final    BOTTLES DRAWN AEROBIC AND ANAEROBIC AERO 9CC ANA 5CC   Culture NO GROWTH 3 DAYS  Final   Report Status PENDING  Incomplete  Wound culture     Status: None (Preliminary result)   Collection Time: 01/14/16  7:16 PM  Result Value Ref Range Status   Specimen Description FOOT  Final   Special Requests NONE  Final   Gram Stain   Final    FEW WBC SEEN RARE GRAM POSITIVE COCCI IN PAIRS FEW GRAM VARIABLE ROD     Culture TOO YOUNG TO READ  Final   Report Status PENDING  Incomplete  MRSA PCR Screening     Status: None   Collection Time: 01/15/16 11:40 PM  Result Value Ref Range Status   MRSA by PCR NEGATIVE NEGATIVE Final    Comment:        The GeneXpert MRSA Assay (FDA approved for NASAL specimens only), is one component of a comprehensive MRSA colonization surveillance program. It is not intended to diagnose MRSA infection nor to guide or monitor treatment for MRSA infections.     Medical History: Past Medical History  Diagnosis Date  . Diabetes mellitus without complication (HCC)   . Lymphedema   . COPD (chronic obstructive pulmonary disease) (HCC)      Assessment: 60 yo male here with diabetic foot ulcer and chronic lymphedema. Pt ordered Zosyn 3.375 g IV x1 and vancomycin 1000 mg IV x1 in the ED. 1/24 BCx x2 NGTD 1/26 WCx ordered  Ke 0.092, half life 7.5 h, Vd 63.7 L   Goal of Therapy:  Vancomycin trough level 15-20 mcg/ml  Plan:  Will continue dosing with vancomycin 1 g IV q8h to start 5 h after initial dose for stacked dosing.  Trough before 5th dose - 1/28 at 0330 Will need to continue to follow renal function and  culture results.   Will continue dosing with Zosyn 3.375 g IV q8h EI.  1/28 03:30 vanc level 14. Increase to 1250 mg q 8 hours. Recheck before 3rd new dose  Pharmacy will continue to follow.   Dennis Fitzgerald 01/16/2016,4:30 AM

## 2016-01-16 NOTE — Consult Note (Signed)
Consult Note  Patient name: Dennis Fitzgerald MRN: [id number] DOB: 10-04-1956 Sex: male  Consulting Physician:  Dr. Orland Jarred  Reason for Consult:  Chief Complaint  Patient presents with  . Wound Infection    HISTORY OF PRESENT ILLNESS: 60 year old gentleman who presented with a right foot wound.  This is been going on for over a month.  He has been treated at the wound center without significant improvement.  When he came in he had a foul-smelling wound and worsening diabetic foot ulcer.  He was started on IV antibiotics.  He was taken to the operating room on 128 by Dr. Orland Jarred who performed removal of an infected fifth metatarsal and debridem he is on a statin for hypercholesterolemia.  ent of skin and soft tissue from the right foot.  He currently has a wound VAC in place.  The patient has been followed in the past for diabetic foot ulcers at [facility].  He has previously undergone a left femoral to below knee popliteal artery bypass graft with vein approximately 3 years ago.  He was recently seen within the past year.  Ultrasound was done which showed moderate superficial femoral disease but a decrease great toe pressure in the 30s.  The patient suffers from diabetes which is poorly controlled.  He is on a statin for hypercholesterolemia.  His blood pressure is controlled with an ACE inhibitor.  He is a current smoker.  Past Medical History  Diagnosis Date  . Diabetes mellitus without complication (HCC)   . Lymphedema   . COPD (chronic obstructive pulmonary disease) (HCC)     History reviewed. No pertinent past surgical history.  Social History   Social History  . Marital Status: Single    Spouse Name: N/A  . Number of Children: N/A  . Years of Education: N/A   Occupational History  . Not on file.   Social History Main Topics  . Smoking status: Current Every Day Smoker  . Smokeless tobacco: Not on file  . Alcohol Use: No  . Drug Use: No  . Sexual Activity: Not on file    Other Topics Concern  . Not on file   Social History Narrative    History reviewed. No pertinent family history.  Allergies as of 01/14/2016  . (No Known Allergies)    No current facility-administered medications on file prior to encounter.   Current Outpatient Prescriptions on File Prior to Encounter  Medication Sig Dispense Refill  . albuterol (PROVENTIL HFA;VENTOLIN HFA) 108 (90 Base) MCG/ACT inhaler Inhale 2 puffs into the lungs every 4 (four) hours as needed for wheezing or shortness of breath.    Marland Kitchen aspirin EC 81 MG tablet Take 81 mg by mouth every evening.    Marland Kitchen atorvastatin (LIPITOR) 40 MG tablet Take 40 mg by mouth every evening.    . ciprofloxacin (CIPRO) 500 MG tablet Take 1 tablet (500 mg total) by mouth 2 (two) times daily. 20 tablet 0  . clindamycin (CLEOCIN) 150 MG capsule Take 3 capsules (450 mg total) by mouth 3 (three) times daily. 90 capsule 0  . clopidogrel (PLAVIX) 75 MG tablet Take 75 mg by mouth every evening.    . collagenase (SANTYL) ointment Apply 1 application topically 2 (two) times daily.    . furosemide (LASIX) 40 MG tablet Take 40 mg by mouth 2 (two) times daily.    . insulin NPH-regular Human (NOVOLIN 70/30) (70-30) 100 UNIT/ML injection Inject 50-55 Units into the skin 2 (  two) times daily. Pt uses 55 units in the morning and 50 at night.    . linagliptin (TRADJENTA) 5 MG TABS tablet Take 5 mg by mouth every evening.     Marland Kitchen lisinopril (PRINIVIL,ZESTRIL) 10 MG tablet Take 10 mg by mouth every evening.    . metFORMIN (GLUCOPHAGE) 1000 MG tablet Take 1,000 mg by mouth 2 (two) times daily.    . potassium chloride (K-DUR,KLOR-CON) 10 MEQ tablet Take 10 mEq by mouth every evening.    Marland Kitchen Umeclidinium-Vilanterol (ANORO ELLIPTA) 62.5-25 MCG/INH AEPB Inhale 1 puff into the lungs every evening.        REVIEW OF SYSTEMS: CONSTITUTIONAL: No fever, fatigue or weakness.  EYES: No blurred or double vision.  EARS, NOSE, AND THROAT: No tinnitus or ear pain.   RESPIRATORY: No cough, shortness of breath, wheezing or hemoptysis.  CARDIOVASCULAR: No chest pain, orthopnea, edema.  GASTROINTESTINAL: No nausea, vomiting, diarrhea or abdominal pain.  GENITOURINARY: No dysuria, hematuria.  ENDOCRINE: No polyuria, nocturia,  HEMATOLOGY: No anemia, easy bruising or bleeding SKIN: Patient has a diabetic foot ulcer in the right foot. He has bilateral lymphedema. Right lower extremity is erythematous MUSCULOSKELETAL: No joint pain or arthritis.  NEUROLOGIC: No tingling, numbness, weakness.  PSYCHIATRY: No anxiety or depression.   PHYSICAL EXAMINATION: General: The patient appears their stated age.  Vital signs are BP 130/68 mmHg  Pulse 90  Temp(Src) 97.8 F (36.6 C) (Oral)  Resp 18  Ht  (1.803 m)  Wt 250 lb (113.399 kg)  BMI 34.88 kg/m2  SpO2 95% Pulmonary: Respirations are non-labored HEENT:  No gross abnormalities Abdomen: Soft and non-tender  Musculoskeletal: There are no major deformities.   Neurologic: No focal weakness or paresthesias are detected, Skin: Wound VAC to the right foot  Psychiatric: The patient has normal affect. Cardiovascular: There is a regular rate and rhythm without significant murmur appreciated.    Assessment:  Diabetic foot ulcer  Plan: Based on what I can determine from the notes done at [facility], the patient has a dampened right great toe pressure.  This suggests that he has at least more proximal disease.  In this setting I recommend angiography to better define his anatomy and to possibly proceed with intervention if he is a candidate either percutaneous or open.  This was discussed with the patient.  I will try to add him onto the angiogram scheduled for Monday.  He will need to be nothing by mouth after midnight on Sunday.  Continue IV antibiotics and wound care    V. Charlena Cross, M.D. [facility] Office: [phone] Pager:  [phone]

## 2016-01-16 NOTE — Anesthesia Postprocedure Evaluation (Signed)
Anesthesia Post Note  Patient: Dennis Fitzgerald  Procedure(s) Performed: Procedure(s) (LRB): IRRIGATION AND DEBRIDEMENT WOUND (Right)  Patient location during evaluation: PACU Anesthesia Type: General Level of consciousness: awake and awake and alert Pain management: satisfactory to patient Vital Signs Assessment: post-procedure vital signs reviewed and stable Respiratory status: patient connected to nasal cannula oxygen and respiratory function stable Cardiovascular status: stable Anesthetic complications: no    Last Vitals:  Filed Vitals:   01/16/16 1115 01/16/16 1120  BP: 145/89   Pulse: 85 84  Temp:    Resp: 17 20    Last Pain:  Filed Vitals:   01/16/16 1125  PainSc: 4                  VAN STAVEREN,Niko Jakel

## 2016-01-16 NOTE — H&P (Signed)
H and P has been reviewed and no changes are noted. The MRI results were a strong positive for osteomyelitis to the fifth metatarsal base and possibly the cuboid. The patient has a severe defect in the wound tissue on the lateral right foot with a large area of deep penetrating necrosis and gangrene that is adjacent to the infected bone. I discussed these findings with the patient today and explained that the degree of tissue loss and the bone infection is a significant threat to his right limb salvage. I do not feel however it is a condition we can treat just by antibiotics and superficial debridement alone. He is extended at risk for limb loss as well as potential sepsis if the infection is not brought under better control through debridement and removal of infected tissue including bone. He seems to understand this and I explained the MRI and x-ray results showing positivity for likely osteomyelitis. Once I explained this to him he to proceed.

## 2016-01-16 NOTE — Transfer of Care (Signed)
Immediate Anesthesia Transfer of Care Note  Patient: Dennis Fitzgerald  Procedure(s) Performed: Procedure(s): IRRIGATION AND DEBRIDEMENT WOUND (Right)  Patient Location: PACU  Anesthesia Type:General  Level of Consciousness: awake, alert  and oriented  Airway & Oxygen Therapy: Patient Spontanous Breathing and Patient connected to face mask oxygen  Post-op Assessment: Report given to RN and Post -op Vital signs reviewed and stable  Post vital signs: Reviewed and stable  Last Vitals:  Filed Vitals:   01/16/16 0700 01/16/16 0851  BP: 128/74 115/54  Pulse: 81 84  Temp: 36.6 C 36.9 C  Resp: 20 18    Complications: No apparent anesthesia complications

## 2016-01-17 LAB — VANCOMYCIN, TROUGH: Vancomycin Tr: 11 ug/mL (ref 10–20)

## 2016-01-17 LAB — CULTURE, BLOOD (ROUTINE X 2)
CULTURE: NO GROWTH
Culture: NO GROWTH

## 2016-01-17 LAB — GLUCOSE, CAPILLARY
GLUCOSE-CAPILLARY: 185 mg/dL — AB (ref 65–99)
GLUCOSE-CAPILLARY: 152 mg/dL — AB (ref 65–99)
GLUCOSE-CAPILLARY: 83 mg/dL (ref 65–99)
Glucose-Capillary: 132 mg/dL — ABNORMAL HIGH (ref 65–99)
Glucose-Capillary: 68 mg/dL (ref 65–99)

## 2016-01-17 NOTE — Progress Notes (Signed)
[facility] [place] [facility] [place]   PATIENT NAME: Dennis Fitzgerald    MR#:  [id number]  DATE OF BIRTH:  16-Jun-1956  SUBJECTIVE:    Pt. Here due to a right foot diabetic foot ulcer and noted to have osteomyelitis of the right fifth metatarsal. Patient is status post removal of the infected fifth metatarsal proximal half and debridement of the underlying soft tissue infection POD # 1. Cont. Wound vac.  No other complaints presently.   REVIEW OF SYSTEMS:    Review of Systems  Constitutional: Negative for fever and chills.  HENT: Negative for congestion and tinnitus.   Eyes: Negative for blurred vision and double vision.  Respiratory: Negative for cough, shortness of breath and wheezing.   Cardiovascular: Negative for chest pain, orthopnea and PND.  Gastrointestinal: Negative for nausea, vomiting, abdominal pain and diarrhea.  Genitourinary: Negative for dysuria and hematuria.  Neurological: Negative for dizziness, sensory change and focal weakness.  All other systems reviewed and are negative.   Nutrition: Carb modified.  Tolerating Diet: yes Tolerating PT: Await Eval.   DRUG ALLERGIES:  No Known Allergies  VITALS:  Blood pressure 121/71, pulse 89, temperature 98.3 F (36.8 C), temperature source Oral, resp. rate 18, height  (1.803 m), weight 113.399 kg (250 lb), SpO2 93 %.  PHYSICAL EXAMINATION:   Physical Exam  GENERAL:  60 y.o.-year-old obese patient lying in the bed in no acute distress.  EYES: Pupils equal, round, reactive to light and accommodation. No scleral icterus. Extraocular muscles intact.  HEENT: Head atraumatic, normocephalic. Oropharynx and nasopharynx clear.  NECK:  Supple, no jugular venous distention. No thyroid enlargement, no tenderness.  LUNGS: Normal breath sounds bilaterally, no wheezing, rales, rhonchi. No use of accessory muscles of respiration.  CARDIOVASCULAR: S1, S2 normal. No murmurs, rubs, or gallops.  ABDOMEN:  Soft, nontender, nondistended. Bowel sounds present. No organomegaly or mass.  EXTREMITIES: No cyanosis, clubbing, chronic lymphedema b/l.  Right Lower ext. wound dressing w/ wound vac in place.     NEUROLOGIC: Cranial nerves II through XII are intact. No focal Motor or sensory deficits b/l.   PSYCHIATRIC: The patient is alert and oriented x 3.   SKIN: No obvious rash, lesion, or ulcer.    LABORATORY PANEL:   CBC  Recent Labs Lab 01/16/16 0406  WBC 7.3  HGB 13.0  HCT 38.4*  PLT 218   ------------------------------------------------------------------------------------------------------------------  Chemistries   Recent Labs Lab 01/16/16 0406  NA 133*  K 3.5  CL 99*  CO2 29  GLUCOSE 97  BUN 6  CREATININE 0.77  CALCIUM 8.2*   ------------------------------------------------------------------------------------------------------------------  Cardiac Enzymes No results for input(s): TROPONINI in the last 168 hours. ------------------------------------------------------------------------------------------------------------------  RADIOLOGY:  Mr Foot Right W Wo Contrast  01/16/2016  CLINICAL DATA:  Foot ulcer near the heel, starting in December 2016, worsening recently. Diabetes. EXAM: MRI OF THE RIGHT HINDFOOT WITHOUT AND WITH CONTRAST TECHNIQUE: Multiplanar, multisequence MR imaging was performed both before and after administration of intravenous contrast. CONTRAST:  20mL MULTIHANCE GADOBENATE DIMEGLUMINE 529 MG/ML IV SOLN COMPARISON:  01/14/2016 radiograph FINDINGS: Diffuse subcutaneous edema in the distal calf and ankle noted. There is an ulceration adjacent to the base of fifth metatarsal. Underlying this ulceration there is a 7.6 by 3.9 by 2.0 cm volume of hypoenhancing and likely necrotic tissue. This extends down to the base of the fifth metatarsal which demonstrates diffuse abnormal edema and enhancement compatible with osteomyelitis of the fifth metatarsal. Edema and  enhancement is most  severe at the metatarsal base but extends in the metatarsal shaft towards the metatarsal head. The peroneus brevis appears to still inserts on the edematous base of the fifth metatarsal in the peroneus longus skirts the margin of the necrotic tissue. There is some tissue necrosis and abnormal edema and enhancement in the abductor digiti minimi muscle as it passes below the fifth metatarsal. The area of soft tissue necrosis abuts the cuboid and there is some faintly increased T2 signal and enhancement in this adjacent portion of the cuboid near the peroneus longus tendon which could reflect an early osteomyelitis for example images 11 through 14 of series 15. There is also abnormal edema and enhancement in the bony articular confluence of the navicular, middle and medial cuneiform, and cuboid as shown on images 16 through 23 of series 11. Of these the most severely involved is the navicular. I am uncertain whether this edema and enhancement is due to early osteomyelitis or underlying arthropathy. Lisfranc ligament intact. There is peroneus longus and peroneus brevis tenosynovitis. There is 5 mm non-fragmented osteochondral lesion of the lateral talar dome on image 22 series 14. Cellulitis noted in the foot especially severe in the vicinity of the ulceration. Please note that the region of the metatarsal heads and forefoot was not included on today's exam. IMPRESSION: 1. 7.6 by 3.9 by 2.0 cm volume of hypoenhancing necrotic tissue underlying the ulceration near the base of the fifth metatarsal osteomyelitis of the base of the fifth metatarsal extending to the margin of the fifth metatarsal. Diffuse osteomyelitis in the fifth metatarsal especially severe at its base. Possible early osteomyelitis involving the inferior-lateral cuboid, and also in the navicular. Questionable early osteomyelitis in the middle and medial cuneiforms. 2. Diffuse infiltrative edema in the calf and ankle compatible with  cellulitis. 3. Incidental 5 mm non-fragmented osteochondral lesion of the lateral talar dome. Electronically Signed   By: Gaylyn Rong M.D.   On: 01/16/2016 08:14     ASSESSMENT AND PLAN:   60 year old male with past history of diabetes type 2, chronic lymphedema, COPD, hyperlipidemia, hypertension who presented to the hospital due to right lower extremity diabetic foot ulcer.  #1 right lower extremity diabetic foot ulcer/Osteomyelitis-appreciate podiatry help and patient is status post debridement and partial removal of the infected fifth metatarsal on the right foot POD # 1. -Continue broad-spectrum IV antibiotics with vancomycin, Zosyn. Follow intraoperative cultures. -Patient will likely need long-term IV antibiotics and will get ID consult in a.m. tomorrow. -Continue local wound care presently. -seen by vascular surgery and will plan for Lower ext. Angiogram tomorrow.   #2 type 2 diabetes without complication-continue Novolin 70/30, sliding scale insulin, Tradjenta  - BS stable.   #3 hypertension-continue lisinopril  #4 COPD-without acute exacerbation. Continue Spiriva, Serevent.   #5 hyperlipidemia-continue atorvastatin.  Await PT eval but pt. May need SNF/STR.    All the records are reviewed and case discussed with Care Management/Social Workerr. Management plans discussed with the patient, family and they are in agreement.  CODE STATUS: Full  DVT Prophylaxis: Lovenox  TOTAL TIME TAKING CARE OF THIS PATIENT: 25 minutes.   POSSIBLE D/C IN 2-3 DAYS, DEPENDING ON CLINICAL CONDITION.   Houston Siren M.D on 01/17/2016 at 1:27 PM  Between 7am to 6pm - Pager - [phone]  After 6pm go to [email or web address] - password [email or web address] [facility]  [place] [facility]  Office  [phone]  CC: Primary care physician; Leanna Sato, MD

## 2016-01-17 NOTE — Progress Notes (Signed)
Patient Demographics  Dennis Fitzgerald, is a 60 y.o. male   MRN: [id number]   DOB - 06-18-1956  Admit Date - 01/14/2016    Outpatient Primary MD for the patient is Leanna Sato, MD  Consult requested in the Hospital by Houston Siren, MD, On 01/17/2016     With History of -  Past Medical History  Diagnosis Date  . Diabetes mellitus without complication (HCC)   . Lymphedema   . COPD (chronic obstructive pulmonary disease) (HCC)       History reviewed. No pertinent past surgical history.  in for   Chief Complaint  Patient presents with  . Wound Infection     HPI  Dennis Fitzgerald  is a 60 y.o. male, diabetic ulceration with gangrene and osteomyelitis to the right fifth metatarsal tuberosity and metatarsal proximal shaft.  Review of Systems    In addition to the HPI above,  No Fever-chills, No Headache, No changes with Vision or hearing, No problems swallowing food or Liquids, No Chest pain, Cough or Shortness of Breath, No Abdominal pain, No Nausea or Vommitting, Bowel movements are regular, No Blood in stool or Urine, No dysuria, No new skin rashes or bruises, No new joints pains-aches,  No new weakness, tingling, numbness in any extremity, No recent weight gain or loss, No polyuria, polydypsia or polyphagia, No significant Mental Stressors.  A full 10 point Review of Systems was done, except as stated above, all other Review of Systems were negative.   Social History Social History  Substance Use Topics  . Smoking status: Current Every Day Smoker  . Smokeless tobacco: Not on file  . Alcohol Use: No     Family History History reviewed. No pertinent family history.   Prior to Admission medications   Medication Sig Start Date End Date Taking? Authorizing Provider  albuterol (PROVENTIL  HFA;VENTOLIN HFA) 108 (90 Base) MCG/ACT inhaler Inhale 2 puffs into the lungs every 4 (four) hours as needed for wheezing or shortness of breath.   Yes Historical Provider, MD  aspirin EC 81 MG tablet Take 81 mg by mouth every evening.   Yes Historical Provider, MD  atorvastatin (LIPITOR) 40 MG tablet Take 40 mg by mouth every evening.   Yes Historical Provider, MD  ciprofloxacin (CIPRO) 500 MG tablet Take 1 tablet (500 mg total) by mouth 2 (two) times daily. 01/12/16  Yes Sharman Cheek, MD  clindamycin (CLEOCIN) 150 MG capsule Take 3 capsules (450 mg total) by mouth 3 (three) times daily. 01/12/16  Yes Sharman Cheek, MD  clopidogrel (PLAVIX) 75 MG tablet Take 75 mg by mouth every evening.   Yes Historical Provider, MD  collagenase (SANTYL) ointment Apply 1 application topically 2 (two) times daily.   Yes Historical Provider, MD  furosemide (LASIX) 40 MG tablet Take 40 mg by mouth 2 (two) times daily.   Yes Historical Provider, MD  insulin NPH-regular Human (NOVOLIN 70/30) (70-30) 100 UNIT/ML injection Inject 50-55 Units into the skin 2 (two) times daily. Pt uses 55 units in the morning and 50 at night.   Yes Historical Provider, MD  linagliptin (TRADJENTA) 5 MG TABS tablet Take 5 mg by mouth every evening.  Yes Historical Provider, MD  lisinopril (PRINIVIL,ZESTRIL) 10 MG tablet Take 10 mg by mouth every evening.   Yes Historical Provider, MD  metFORMIN (GLUCOPHAGE) 1000 MG tablet Take 1,000 mg by mouth 2 (two) times daily.   Yes Historical Provider, MD  potassium chloride (K-DUR,KLOR-CON) 10 MEQ tablet Take 10 mEq by mouth every evening.   Yes Historical Provider, MD  Umeclidinium-Vilanterol (ANORO ELLIPTA) 62.5-25 MCG/INH AEPB Inhale 1 puff into the lungs every evening.    Yes Historical Provider, MD    Anti-infectives    Start     Dose/Rate Route Frequency Ordered Stop   01/16/16 1200  vancomycin (VANCOCIN) 1,250 mg in sodium chloride 0.9 % 250 mL IVPB     1,250 mg 166.7 mL/hr over 90  Minutes Intravenous Every 8 hours 01/16/16 0430     01/14/16 2000  vancomycin (VANCOCIN) IVPB 1000 mg/200 mL premix  Status:  Discontinued     1,000 mg 200 mL/hr over 60 Minutes Intravenous Every 8 hours 01/14/16 1636 01/16/16 0430   01/14/16 2000  piperacillin-tazobactam (ZOSYN) IVPB 3.375 g     3.375 g 12.5 mL/hr over 240 Minutes Intravenous 3 times per day 01/14/16 1636     01/14/16 1245  piperacillin-tazobactam (ZOSYN) IVPB 3.375 g     3.375 g 12.5 mL/hr over 240 Minutes Intravenous  Once 01/14/16 1241 01/14/16 1524   01/14/16 1245  vancomycin (VANCOCIN) IVPB 1000 mg/200 mL premix     1,000 mg 200 mL/hr over 60 Minutes Intravenous  Once 01/14/16 1244 01/14/16 1440      Scheduled Meds: . atorvastatin  40 mg Oral QPM  . enoxaparin (LOVENOX) injection  40 mg Subcutaneous Q24H  . furosemide  40 mg Oral BID  . insulin aspart  0-20 Units Subcutaneous TID WC  . insulin aspart  0-5 Units Subcutaneous QHS  . insulin aspart protamine- aspart  45 Units Subcutaneous BID WC  . linagliptin  5 mg Oral QPM  . lisinopril  10 mg Oral QPM  . piperacillin-tazobactam (ZOSYN)  IV  3.375 g Intravenous 3 times per day  . potassium chloride  10 mEq Oral QPM  . salmeterol  1 puff Inhalation BID  . tiotropium  18 mcg Inhalation Daily  . vancomycin  1,250 mg Intravenous Q8H   Continuous Infusions:  PRN Meds:.albuterol, magic mouthwash, morphine injection, oxyCODONE  No Known Allergies  Physical Exam  Vitals  Blood pressure 121/71, pulse 89, temperature 98.3 F (36.8 C), temperature source Oral, resp. rate 18, height 5\' 11"  (1.803 m), weight 113.399 kg (250 lb), SpO2 93 %.  Lower Extremity exam: Patient underwent surgery yesterday to remove infected bone from the fifth metatarsal right foot. antibiotic beads laced with vancomycin and gentamicin were placed in the wound. A wound VAC was also utilized on the proximal portion wound could not be primarily closed. This is still intact and functional.    CBC  Recent Labs Lab 01/12/16 1328 01/14/16 1029 01/15/16 0451 01/16/16 0406  WBC 13.6* 12.1* 9.3 7.3  HGB 14.1 13.8 13.0 13.0  HCT 42.0 41.1 38.5* 38.4*  PLT 235 255 232 218  MCV 83.5 83.0 83.9 82.6  MCH 28.1 27.8 28.4 27.9  MCHC 33.6 33.5 33.8 33.7  RDW 14.8* 15.1* 14.9* 14.9*  LYMPHSABS 0.9*  --   --   --   MONOABS 0.8  --   --   --   EOSABS 0.2  --   --   --   BASOSABS 0.1  --   --   --    ------------------------------------------------------------------------------------------------------------------  Chemistries   Recent Labs Lab 01/12/16 1328 01/14/16 1029 01/15/16 0451 01/16/16 0406  NA 128* 127* 130* 133*  K 3.9 4.5 3.4* 3.5  CL 94* 90* 96* 99*  CO2 GLUCOSE 308* 238* 93 97  BUN CREATININE 0.67 0.79 0.68 0.77  CALCIUM 8.5* 8.5* 8.2* 8.2*   ------------------------------------------------------------------------------------------------------------------ estimated creatinine clearance is 127.3 mL/min (by C-G formula based on Cr of 0.77). ------------------------------------------------------------------------------------------------------------------  Recent Labs  01/14/16 1029  TSH 1.776    ---------------------------------------------------------------------------------------------------------------  Imaging results:   Mr Foot Right W Wo Contrast  01/16/2016  CLINICAL DATA:  Foot ulcer near the heel, starting in December 2016, worsening recently. Diabetes. EXAM: MRI OF THE RIGHT HINDFOOT WITHOUT AND WITH CONTRAST TECHNIQUE: Multiplanar, multisequence MR imaging was performed both before and after administration of intravenous contrast. CONTRAST:  20mL MULTIHANCE GADOBENATE DIMEGLUMINE 529 MG/ML IV SOLN COMPARISON:  01/14/2016 radiograph FINDINGS: Diffuse subcutaneous edema in the distal calf and ankle noted. There is an ulceration adjacent to the base of fifth metatarsal. Underlying this ulceration there is a 7.6 by 3.9 by 2.0  cm volume of hypoenhancing and likely necrotic tissue. This extends down to the base of the fifth metatarsal which demonstrates diffuse abnormal edema and enhancement compatible with osteomyelitis of the fifth metatarsal. Edema and enhancement is most severe at the metatarsal base but extends in the metatarsal shaft towards the metatarsal head. The peroneus brevis appears to still inserts on the edematous base of the fifth metatarsal in the peroneus longus skirts the margin of the necrotic tissue. There is some tissue necrosis and abnormal edema and enhancement in the abductor digiti minimi muscle as it passes below the fifth metatarsal. The area of soft tissue necrosis abuts the cuboid and there is some faintly increased T2 signal and enhancement in this adjacent portion of the cuboid near the peroneus longus tendon which could reflect an early osteomyelitis for example images 11 through 14 of series 15. There is also abnormal edema and enhancement in the bony articular confluence of the navicular, middle and medial cuneiform, and cuboid as shown on images 16 through 23 of series 11. Of these the most severely involved is the navicular. I am uncertain whether this edema and enhancement is due to early osteomyelitis or underlying arthropathy. Lisfranc ligament intact. There is peroneus longus and peroneus brevis tenosynovitis. There is 5 mm non-fragmented osteochondral lesion of the lateral talar dome on image 22 series 14. Cellulitis noted in the foot especially severe in the vicinity of the ulceration. Please note that the region of the metatarsal heads and forefoot was not included on today's exam. IMPRESSION: 1. 7.6 by 3.9 by 2.0 cm volume of hypoenhancing necrotic tissue underlying the ulceration near the base of the fifth metatarsal osteomyelitis of the base of the fifth metatarsal extending to the margin of the fifth metatarsal. Diffuse osteomyelitis in the fifth metatarsal especially severe at its base.  Possible early osteomyelitis involving the inferior-lateral cuboid, and also in the navicular. Questionable early osteomyelitis in the middle and medial cuneiforms. 2. Diffuse infiltrative edema in the calf and ankle compatible with cellulitis. 3. Incidental 5 mm non-fragmented osteochondral lesion of the lateral talar dome. Electronically Signed   By: Gaylyn Rong M.D.   On: 01/16/2016 08:14   Assessment & Plan: Patient evaluated today. Wound VAC is intact and functional. He is still nonweightbearing at this point. I will get a night splint from my office in order to put  on and to hold his foot in a good position and hopefully to get scarring and of the residual peroneus brevis tendon. 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 This is all been explained to him. Plan: He scheduled for an angioplasty probably tomorrow or the next day. Infectious disease consult is in. We will perhaps change wound VAC tomorrow. Remain nonweightbearing at present.   Active Problems:   Diabetic foot ulcer (HCC)  Family Communication: Plan discussed with patient .  Epimenio Sarin M.D on 01/17/2016 at 10:03 AM  Thank you for the consult, we will follow the patient with you in the Hospital.

## 2016-01-17 NOTE — Progress Notes (Signed)
ANTIBIOTIC CONSULT NOTE - INITIAL  Pharmacy Consult for Vancomycin and Zosyn Indication: Diabetic foot ulcer  No Known Allergies  Patient Measurements: Height:  (180.3 cm) Weight: 250 lb (113.399 kg) IBW/kg (Calculated) : 75.3 Adjusted Body Weight: 91 kg  Vital Signs: Temp: 98.3 F (36.8 C) (01/29 0439) Temp Source: Oral (01/29 0439) BP: 121/71 mmHg (01/29 0439) Pulse Rate: 89 (01/29 0439) Intake/Output from previous day: 01/28 0701 - 01/29 0700 In: 950 [I.V.:800; IV Piggyback:150] Out: 1200 [Urine:1175; Drains:25] Intake/Output from this shift: Total I/O In: -  Out: 375 [Urine:375]  Labs:  Recent Labs  01/14/16 1029 01/15/16 0451 01/16/16 0406  WBC 12.1* 9.3 7.3  HGB 13.8 13.0 13.0  PLT 255 232 218  CREATININE 0.79 0.68 0.77   Estimated Creatinine Clearance: 127.3 mL/min (by C-G formula based on Cr of 0.77).  Recent Labs  01/16/16 0406 01/17/16 0409  VANCOTROUGH 14 11     Microbiology: Recent Results (from the past 720 hour(Fitzgerald))  Culture, blood (routine x 2)     Status: None (Preliminary result)   Collection Time: 01/12/16  1:15 PM  Result Value Ref Range Status   Specimen Description BLOOD LEFT HAND  Final   Special Requests   Final    BOTTLES DRAWN AEROBIC AND ANAEROBIC AERO 4CC ANA 5CC   Culture NO GROWTH 4 DAYS  Final   Report Status PENDING  Incomplete  Culture, blood (routine x 2)     Status: None (Preliminary result)   Collection Time: 01/12/16  1:24 PM  Result Value Ref Range Status   Specimen Description BLOOD RIGHT HAND  Final   Special Requests   Final    BOTTLES DRAWN AEROBIC AND ANAEROBIC AERO 9CC ANA 5CC   Culture NO GROWTH 4 DAYS  Final   Report Status PENDING  Incomplete  Wound culture     Status: None (Preliminary result)   Collection Time: 01/14/16  7:16 PM  Result Value Ref Range Status   Specimen Description FOOT  Final   Special Requests NONE  Final   Gram Stain   Final    FEW WBC SEEN RARE GRAM POSITIVE COCCI IN  PAIRS FEW GRAM VARIABLE ROD    Culture   Final    LIGHT GROWTH GRAM NEGATIVE RODS IDENTIFICATION AND SUSCEPTIBILITIES TO FOLLOW ONCE ISOLATED    Report Status PENDING  Incomplete  MRSA PCR Screening     Status: None   Collection Time: 01/15/16 11:40 PM  Result Value Ref Range Status   MRSA by PCR NEGATIVE NEGATIVE Final    Comment:        The GeneXpert MRSA Assay (FDA approved for NASAL specimens only), is one component of a comprehensive MRSA colonization surveillance program. It is not intended to diagnose MRSA infection nor to guide or monitor treatment for MRSA infections.     Medical History: Past Medical History  Diagnosis Date  . Diabetes mellitus without complication (HCC)   . Lymphedema   . COPD (chronic obstructive pulmonary disease) (HCC)      Assessment: 60 yo male here with diabetic foot ulcer and chronic lymphedema. Pt ordered Zosyn 3.375 g IV x1 and vancomycin 1000 mg IV x1 in the ED. 1/24 BCx x2 NGTD 1/26 WCx ordered  Ke 0.092, half life 7.5 h, Vd 63.7 L   Goal of Therapy:  Vancomycin trough level 15-20 mcg/ml  Plan:  Will continue dosing with vancomycin 1 g IV q8h to start 5 h after initial dose for stacked dosing.  Trough before 5th dose - 1/28 at 0330 Will need to continue to follow renal function and culture results.   Will continue dosing with Zosyn 3.375 g IV q8h EI.  1/28 03:30 vanc level 14. Increase to 1250 mg q 8 hours. Recheck before 3rd new dose  1/29 AM vanc level 11. 12 noon dose does not appear to have been given. Recheck after 4th consecutive doe.  Pharmacy will continue to follow.   Dennis Fitzgerald 01/17/2016,4:54 AM

## 2016-01-18 ENCOUNTER — Encounter: Payer: Self-pay | Admitting: Podiatry

## 2016-01-18 LAB — WOUND CULTURE

## 2016-01-18 LAB — GLUCOSE, CAPILLARY
GLUCOSE-CAPILLARY: 128 mg/dL — AB (ref 65–99)
Glucose-Capillary: 120 mg/dL — ABNORMAL HIGH (ref 65–99)
Glucose-Capillary: 180 mg/dL — ABNORMAL HIGH (ref 65–99)
Glucose-Capillary: 113 mg/dL — ABNORMAL HIGH (ref 65–99)

## 2016-01-18 LAB — VANCOMYCIN, TROUGH: VANCOMYCIN TR: 17 ug/mL (ref 10–20)

## 2016-01-18 MED ORDER — ACETAMINOPHEN 325 MG PO TABS: 650.0000 mg | ORAL_TABLET | Freq: Four times a day (QID) | ORAL | Status: DC | PRN

## 2016-01-18 NOTE — Progress Notes (Signed)
Inpatient Diabetes Program Recommendations  AACE/ADA: New Consensus Statement on Inpatient Glycemic Control (2015)  Target Ranges:  Prepandial:   less than 140 mg/dL      Peak postprandial:   less than 180 mg/dL (1-2 hours)      Critically ill patients:  140 - 180 mg/dL   Review of Glycemic Control  Results for Dennis Fitzgerald, FRERICKS (MRN [id number]) as of 01/18/2016 09:23  Ref. Range 01/17/2016 11:42 01/17/2016 16:44 01/17/2016 21:34 01/17/2016 22:28 01/18/2016 08:02  Glucose-Capillary Latest Ref Range: 65-99 mg/dL 409 (H) 811 (H) 68 83 914 (H)    Diabetes history: Type 2 Outpatient Diabetes medications: Novolin 70/30 55 units qam, 50 units qpm, Metformin  bid, Tradgenta /day Current orders for Inpatient glycemic control: Novolog 70/30 45 units bid, Tradjenta /day, Novolog 0-20 units tid, Novolog 0-5 units qhs  Susette Racer, RN, [place], [place], CDE Diabetes Coordinator Inpatient Diabetes Program  [phone] (Team Pager) [phone] [facility] Office) 01/21/2016 8:53 AM

## 2016-01-18 NOTE — Progress Notes (Addendum)
ANTIBIOTIC CONSULT NOTE - INITIAL  Pharmacy Consult for Vancomycin and Zosyn Indication: Diabetic foot ulcer  No Known Allergies  Patient Measurements: Height:  (180.3 cm) Weight: 250 lb (113.399 kg) IBW/kg (Calculated) : 75.3 Adjusted Body Weight: 91 kg  Vital Signs: Temp: 98.2 F (36.8 C) (01/29 2137) Temp Source: Oral (01/29 2137) BP: 126/64 mmHg (01/29 2137) Pulse Rate: 85 (01/29 2137) Intake/Output from previous day: 01/29 0701 - 01/30 0700 In: 970 [P.O.:720; IV Piggyback:250] Out: 2600 [Urine:2400; Drains:200] Intake/Output from this shift: Total I/O In: 240 [P.O.:240] Out: 1750 [Urine:1750]  Labs:  Recent Labs  01/15/16 0451 01/16/16 0406  WBC 9.3 7.3  HGB 13.0 13.0  PLT 232 218  CREATININE 0.68 0.77   Estimated Creatinine Clearance: 127.3 mL/min (by C-G formula based on Cr of 0.77).  Recent Labs  01/17/16 0409 01/18/16 0408  VANCOTROUGH 11 17     Microbiology: Recent Results (from the past 720 hour(s))  Culture, blood (routine x 2)     Status: None   Collection Time: 01/12/16  1:15 PM  Result Value Ref Range Status   Specimen Description BLOOD LEFT HAND  Final   Special Requests   Final    BOTTLES DRAWN AEROBIC AND ANAEROBIC AERO 4CC ANA 5CC   Culture NO GROWTH 5 DAYS  Final   Report Status 01/17/2016 FINAL  Final  Culture, blood (routine x 2)     Status: None   Collection Time: 01/12/16  1:24 PM  Result Value Ref Range Status   Specimen Description BLOOD RIGHT HAND  Final   Special Requests   Final    BOTTLES DRAWN AEROBIC AND ANAEROBIC AERO 9CC ANA 5CC   Culture NO GROWTH 5 DAYS  Final   Report Status 01/17/2016 FINAL  Final  Wound culture     Status: None (Preliminary result)   Collection Time: 01/14/16  7:16 PM  Result Value Ref Range Status   Specimen Description FOOT  Final   Special Requests NONE  Final   Gram Stain   Final    FEW WBC SEEN RARE GRAM POSITIVE COCCI IN PAIRS FEW GRAM VARIABLE ROD    Culture   Final     LIGHT GROWTH GRAM NEGATIVE RODS IDENTIFICATION AND SUSCEPTIBILITIES TO FOLLOW    Report Status PENDING  Incomplete  MRSA PCR Screening     Status: None   Collection Time: 01/15/16 11:40 PM  Result Value Ref Range Status   MRSA by PCR NEGATIVE NEGATIVE Final    Comment:        The GeneXpert MRSA Assay (FDA approved for NASAL specimens only), is one component of a comprehensive MRSA colonization surveillance program. It is not intended to diagnose MRSA infection nor to guide or monitor treatment for MRSA infections.   Wound culture     Status: None (Preliminary result)   Collection Time: 01/16/16 10:22 AM  Result Value Ref Range Status   Specimen Description WOUND  Final   Special Requests NONE  Final   Gram Stain RARE WBC SEEN NO ORGANISMS SEEN   Final   Culture HOLDING FOR POSSIBLE PATHOGEN  Final   Report Status PENDING  Incomplete    Medical History: Past Medical History  Diagnosis Date  . Diabetes mellitus without complication (HCC)   . Lymphedema   . COPD (chronic obstructive pulmonary disease) (HCC)      Assessment: 60 yo male here with diabetic foot ulcer and chronic lymphedema. Pt ordered Zosyn 3.375 g IV x1 and vancomycin 1000  mg IV x1 in the ED. 1/24 BCx x2 NGTD 1/26 WCx ordered  Ke 0.092, half life 7.5 h, Vd 63.7 L   Goal of Therapy:  Vancomycin trough level 15-20 mcg/ml  Plan:  Will continue dosing with vancomycin 1 g IV q8h to start 5 h after initial dose for stacked dosing.  Trough before 5th dose - 1/28 at 0330 Will need to continue to follow renal function and culture results.   Will continue dosing with Zosyn 3.375 g IV q8h EI.  1/28 03:30 vanc level 14. Increase to 1250 mg q 8 hours. Recheck before 3rd new dose  1/29 AM vanc level 11. 12 noon dose does not appear to have been given. Recheck after 4th consecutive dose.  1/30 03:30 vanc level 17. Continue current regimen. F/u SCr in AM.  Pharmacy will continue to follow.   Geraldyne Barraclough  S 01/18/2016,4:41 AM

## 2016-01-18 NOTE — Progress Notes (Signed)
[facility] [place] at [facility]   PATIENT NAME: Dennis Fitzgerald    MR#:  [id number]  DATE OF BIRTH:  Sep 30, 1956  SUBJECTIVE:    Pt. Here due to a right foot diabetic foot ulcer and noted to have osteomyelitis of the right fifth metatarsal. Patient is status post removal of the infected fifth metatarsal proximal half and debridement of the underlying soft tissue infection POD # 2. Cont. Wound vac.  No other complaints presently.   REVIEW OF SYSTEMS:    Review of Systems  Constitutional: Negative for fever and chills.  HENT: Negative for congestion and tinnitus.   Eyes: Negative for blurred vision and double vision.  Respiratory: Negative for cough, shortness of breath and wheezing.   Cardiovascular: Negative for chest pain, orthopnea and PND.  Gastrointestinal: Negative for nausea, vomiting, abdominal pain and diarrhea.  Genitourinary: Negative for dysuria and hematuria.  Neurological: Negative for dizziness, sensory change and focal weakness.  All other systems reviewed and are negative.   Nutrition: Carb modified.  Tolerating Diet: yes Tolerating PT: Await Eval.   DRUG ALLERGIES:  No Known Allergies  VITALS:  Blood pressure 150/80, pulse 92, temperature 99.7 F (37.6 C), temperature source Oral, resp. rate 18, height  (1.803 m), weight 113.399 kg (250 lb), SpO2 96 %.  PHYSICAL EXAMINATION:   Physical Exam  GENERAL:  60 y.o.-year-old obese patient lying in the bed in no acute distress.  EYES: Pupils equal, round, reactive to light and accommodation. No scleral icterus. Extraocular muscles intact.  HEENT: Head atraumatic, normocephalic. Oropharynx and nasopharynx clear.  NECK:  Supple, no jugular venous distention. No thyroid enlargement, no tenderness.  LUNGS: Normal breath sounds bilaterally, no wheezing, rales, rhonchi. No use of accessory muscles of respiration.  CARDIOVASCULAR: S1, S2 normal. No murmurs, rubs, or gallops.  ABDOMEN:  Soft, nontender, nondistended. Bowel sounds present. No organomegaly or mass.  EXTREMITIES: No cyanosis, clubbing, chronic lymphedema b/l.  Right Lower ext. wound dressing w/ wound vac in place. No acute drainage noted.     NEUROLOGIC: Cranial nerves II through XII are intact. No focal Motor or sensory deficits b/l.   PSYCHIATRIC: The patient is alert and oriented x 3. Good affect.    SKIN: No obvious rash, lesion, or ulcer.    LABORATORY PANEL:   CBC  Recent Labs Lab 01/16/16 0406  WBC 7.3  HGB 13.0  HCT 38.4*  PLT 218   ------------------------------------------------------------------------------------------------------------------  Chemistries   Recent Labs Lab 01/16/16 0406  NA 133*  K 3.5  CL 99*  CO2 29  GLUCOSE 97  BUN 6  CREATININE 0.77  CALCIUM 8.2*   ------------------------------------------------------------------------------------------------------------------  Cardiac Enzymes No results for input(s): TROPONINI in the last 168 hours. ------------------------------------------------------------------------------------------------------------------  RADIOLOGY:  No results found.   ASSESSMENT AND PLAN:   60 year old male with past history of diabetes type 2, chronic lymphedema, COPD, hyperlipidemia, hypertension who presented to the hospital due to right lower extremity diabetic foot ulcer.  #1 right lower extremity diabetic foot ulcer/Osteomyelitis-appreciate podiatry help and patient is status post debridement and partial removal of the infected fifth metatarsal on the right foot POD # 2. -Continue IV Zosyn and d/c Vanco. Wound cultures already growing Enterobacter presently. -Appreciate infectious disease input and patient will likely need 4-6 weeks of antibiotics. Order for PICC line placed by them. -seen by vascular surgery and plan for Lower ext. Angiogram tomorrow.   #2 type 2 diabetes without complication-continue Novolin 70/30, sliding scale  insulin, Tradjenta  -  BS stable.   #3 hypertension-continue lisinopril  #4 COPD-without acute exacerbation. Continue Spiriva, Serevent.  #5 hyperlipidemia-continue atorvastatin.  Await physical therapy evaluation.   All the records are reviewed and case discussed with Care Management/Social Workerr. Management plans discussed with the patient, family and they are in agreement.  CODE STATUS: Full  DVT Prophylaxis: Lovenox  TOTAL TIME TAKING CARE OF THIS PATIENT: 25 minutes.   POSSIBLE D/C IN 2-3 DAYS, DEPENDING ON CLINICAL CONDITION.   Houston Siren M.D on 01/18/2016 at 2:40 PM  Between 7am to 6pm - Pager - [phone]  After 6pm go to [email or web address] - password [email or web address] [facility]  [facility]  Office  [phone]  CC: Primary care physician; Leanna Sato, MD

## 2016-01-18 NOTE — Progress Notes (Signed)
[place] INFECTIOUS DISEASE PROGRESS NOTE Date of Admission:  01/14/2016     ID: Dennis Fitzgerald is a 60 y.o. male with diabetic foot infection and osteomyelitis  Active Problems:   Diabetic foot ulcer ([place])   Subjective: S/p surgery. Denies pain. No fevers,  ROS  Eleven systems are reviewed and negative except per hpi  Medications:  Antibiotics Given (last 72 hours)    Date/Time Action Medication Dose Rate   01/15/16 1500 Given   piperacillin-tazobactam (ZOSYN) IVPB 3.375 g 3.375 g 12.5 mL/hr   01/15/16 2120 Given   vancomycin (VANCOCIN) IVPB 1000 mg/200 mL premix 1,000 mg 200 mL/hr   01/15/16 2246 Given   piperacillin-tazobactam (ZOSYN) IVPB 3.375 g 3.375 g 12.5 mL/hr   01/16/16 0415 Given   vancomycin (VANCOCIN) IVPB 1000 mg/200 mL premix 1,000 mg 200 mL/hr   01/16/16 0657 Given   piperacillin-tazobactam (ZOSYN) IVPB 3.375 g 3.375 g 12.5 mL/hr   01/16/16 1750 Given   piperacillin-tazobactam (ZOSYN) IVPB 3.375 g 3.375 g 12.5 mL/hr   01/16/16 2102 Given   vancomycin (VANCOCIN) 1,250 mg in sodium chloride 0.9 % 250 mL IVPB 1,250 mg 166.7 mL/hr   01/17/16 0400 Given   vancomycin (VANCOCIN) 1,250 mg in sodium chloride 0.9 % 250 mL IVPB 1,250 mg 166.7 mL/hr   01/17/16 0600 Given   piperacillin-tazobactam (ZOSYN) IVPB 3.375 g 3.375 g 12.5 mL/hr   01/17/16 1252 Given   vancomycin (VANCOCIN) 1,250 mg in sodium chloride 0.9 % 250 mL IVPB 1,250 mg 166.7 mL/hr   01/17/16 2009 Given   vancomycin (VANCOCIN) 1,250 mg in sodium chloride 0.9 % 250 mL IVPB 1,250 mg 166.7 mL/hr   01/17/16 2251 Given   piperacillin-tazobactam (ZOSYN) IVPB 3.375 g 3.375 g 12.5 mL/hr   01/18/16 0419 Given   vancomycin (VANCOCIN) 1,250 mg in sodium chloride 0.9 % 250 mL IVPB 1,250 mg 166.7 mL/hr   01/18/16 0160 Given   piperacillin-tazobactam (ZOSYN) IVPB 3.375 g 3.375 g 12.5 mL/hr   01/18/16 1237 Given   piperacillin-tazobactam (ZOSYN) IVPB 3.375 g 3.375 g 12.5 mL/hr   01/18/16 1237 Given   vancomycin  (VANCOCIN) 1,250 mg in sodium chloride 0.9 % 250 mL IVPB 1,250 mg 166.7 mL/hr     . atorvastatin  40 mg Oral QPM  . enoxaparin (LOVENOX) injection  40 mg Subcutaneous Q24H  . furosemide  40 mg Oral BID  . insulin aspart  0-20 Units Subcutaneous TID WC  . insulin aspart  0-5 Units Subcutaneous QHS  . insulin aspart protamine- aspart  45 Units Subcutaneous BID WC  . linagliptin  5 mg Oral QPM  . lisinopril  10 mg Oral QPM  . piperacillin-tazobactam (ZOSYN)  IV  3.375 g Intravenous 3 times per day  . potassium chloride  10 mEq Oral QPM  . salmeterol  1 puff Inhalation BID  . tiotropium  18 mcg Inhalation Daily  . vancomycin  1,250 mg Intravenous Q8H    Objective: Vital signs in last 24 hours: Temp:  [98.2 F (36.8 C)-99.7 F (37.6 C)] 99.7 F (37.6 C) (01/30 1313) Pulse Rate:  [84-92] 92 (01/30 1313) Resp:  [18-20] 18 (01/30 0551) BP: (126-150)/(64-80) 150/80 mmHg (01/30 1313) SpO2:  [92 %-96 %] 96 % (01/30 1313) Constitutional: dishelved, obese No distress.  HENT: anicteric  Mouth/Throat: Oropharynx is clear and moist. No oropharyngeal exudate.  Cardiovascular: Normal rate, regular rhythm  Pulmonary/Chest: Effort normal and breath sounds normal. No respiratory distress. He has no wheezes.  Abdominal: Soft. Bowel sounds are normal.  He exhibits no distension. There is no tenderness.  Lymphadenopathy: He has no cervical adenopathy.  Neurological: He is alert and oriented to person, place, and time.  Ext impressive bilateral LE lymphedema Skin: RLE wrapped post op Psychiatric: He has a normal mood and affect. His behavior is normal.   Lab Results  Recent Labs  01/16/16 0406  WBC 7.3  HGB 13.0  HCT 38.4*  NA 133*  K 3.5  CL 99*  CO2 29  BUN 6  CREATININE 0.77    Microbiology: Results for orders placed or performed during the hospital encounter of 01/14/16  Wound culture     Status: None   Collection Time: 01/14/16  7:16 PM  Result Value Ref Range Status    Specimen Description FOOT  Final   Special Requests NONE  Final   Gram Stain   Final    FEW WBC SEEN RARE GRAM POSITIVE COCCI IN PAIRS FEW GRAM VARIABLE ROD    Culture LIGHT GROWTH ENTEROBACTER CLOACAE  Final   Report Status 01/18/2016 FINAL  Final   Organism ID, Bacteria ENTEROBACTER CLOACAE  Final      Susceptibility   Enterobacter cloacae - MIC*    PIP/TAZO Value in next row Sensitive      SENSITIVE<=4    CEFTAZIDIME Value in next row Sensitive      SENSITIVE<=1    CEFTRIAXONE Value in next row Sensitive      SENSITIVE<=1    IMIPENEM Value in next row Sensitive      SENSITIVE<=0.25    GENTAMICIN Value in next row Sensitive      SENSITIVE<=1    CIPROFLOXACIN Value in next row Sensitive      SENSITIVE<=0.25    TRIMETH/SULFA Value in next row Sensitive      SENSITIVE<=20    CEFEPIME Value in next row Sensitive      SENSITIVE<=1    * LIGHT GROWTH ENTEROBACTER CLOACAE  MRSA PCR Screening     Status: None   Collection Time: 01/15/16 11:40 PM  Result Value Ref Range Status   MRSA by PCR NEGATIVE NEGATIVE Final    Comment:        The GeneXpert MRSA Assay (FDA approved for NASAL specimens only), is one component of a comprehensive MRSA colonization surveillance program. It is not intended to diagnose MRSA infection nor to guide or monitor treatment for MRSA infections.   Anaerobic culture     Status: None (Preliminary result)   Collection Time: 01/16/16 10:22 AM  Result Value Ref Range Status   Specimen Description WOUND  Final   Special Requests NONE  Final   Gram Stain PENDING  Incomplete   Culture NO ANAEROBES ISOLATED  Final   Report Status PENDING  Incomplete  Wound culture     Status: None (Preliminary result)   Collection Time: 01/16/16 10:22 AM  Result Value Ref Range Status   Specimen Description WOUND  Final   Special Requests NONE  Final   Gram Stain RARE WBC SEEN NO ORGANISMS SEEN   Final   Culture HOLDING FOR POSSIBLE PATHOGEN  Final   Report Status  PENDING  Incomplete    Studies/Results: Mr Foot Right W Wo Contrast  01/16/2016  CLINICAL DATA:  Foot ulcer near the heel, starting in December 2016, worsening recently. Diabetes. EXAM: MRI OF THE RIGHT HINDFOOT WITHOUT AND WITH CONTRAST TECHNIQUE: Multiplanar, multisequence MR imaging was performed both before and after administration of intravenous contrast. CONTRAST:  26m MULTIHANCE GADOBENATE DIMEGLUMINE 529 MG/ML  IV SOLN COMPARISON:  01/14/2016 radiograph FINDINGS: Diffuse subcutaneous edema in the distal calf and ankle noted. There is an ulceration adjacent to the base of fifth metatarsal. Underlying this ulceration there is a 7.6 by 3.9 by 2.0 cm volume of hypoenhancing and likely necrotic tissue. This extends down to the base of the fifth metatarsal which demonstrates diffuse abnormal edema and enhancement compatible with osteomyelitis of the fifth metatarsal. Edema and enhancement is most severe at the metatarsal base but extends in the metatarsal shaft towards the metatarsal head. The peroneus brevis appears to still inserts on the edematous base of the fifth metatarsal in the peroneus longus skirts the margin of the necrotic tissue. There is some tissue necrosis and abnormal edema and enhancement in the abductor digiti minimi muscle as it passes below the fifth metatarsal. The area of soft tissue necrosis abuts the cuboid and there is some faintly increased T2 signal and enhancement in this adjacent portion of the cuboid near the peroneus longus tendon which could reflect an early osteomyelitis for example images 11 through 14 of series 15. There is also abnormal edema and enhancement in the bony articular confluence of the navicular, middle and medial cuneiform, and cuboid as shown on images 16 through 23 of series 11. Of these the most severely involved is the navicular. I am uncertain whether this edema and enhancement is due to early osteomyelitis or underlying arthropathy. Lisfranc ligament  intact. There is peroneus longus and peroneus brevis tenosynovitis. There is 5 mm non-fragmented osteochondral lesion of the lateral talar dome on image 22 series 14. Cellulitis noted in the foot especially severe in the vicinity of the ulceration. Please note that the region of the metatarsal heads and forefoot was not included on today's exam. IMPRESSION: 1. 7.6 by 3.9 by 2.0 cm volume of hypoenhancing necrotic tissue underlying the ulceration near the base of the fifth metatarsal osteomyelitis of the base of the fifth metatarsal extending to the margin of the fifth metatarsal. Diffuse osteomyelitis in the fifth metatarsal especially severe at its base. Possible early osteomyelitis involving the inferior-lateral cuboid, and also in the navicular. Questionable early osteomyelitis in the middle and medial cuneiforms. 2. Diffuse infiltrative edema in the calf and ankle compatible with cellulitis. 3. Incidental 5 mm non-fragmented osteochondral lesion of the lateral talar dome. Electronically Signed   By: Van Clines M.D.   On: 01/16/2016 08:14   Dg Foot Complete Right  01/14/2016  CLINICAL DATA:  History of poorly controlled diabetes, chronic lymphedema, dorsal foot ulcer for 7 weeks. Currently on antibiotics but with persistent symptoms. EXAM: RIGHT FOOT COMPLETE - 3+ VIEW COMPARISON:  Right foot plain film dated 01/12/2016 and right foot plain film dated 12/28/2015. FINDINGS: Again noted is an apparent soft tissue defect overlying the right fifth metatarsal base. Compared to earlier plain film from 12/28/2015, there appear to be developing demineralization within the fifth metatarsal base and developing defects along the lateral cortex margin of the fifth metatarsal base, highly suspicious for developing osteomyelitis. Remainder of the osseous structures of the right foot appear intact and normal in mineralization. No soft tissue gas seen. IMPRESSION: 1. Presumed soft tissue defect/ulceration overlying  the base of the right fifth metatarsal base. 2. Probable developing demineralization and cortex discontinuity along the lateral margin of the underlying fifth metatarsal base, highly suspicious for developing osteomyelitis. Would consider MRI for further characterization. These results were called by telephone at the time of interpretation on 01/14/2016 at 1:34 pm to Dr. Charlotte Crumb ,  who verbally acknowledged these results. Electronically Signed   By: Franki Cabot M.D.   On: 01/14/2016 13:36   Dg Foot Complete Right  01/12/2016  CLINICAL DATA:  Right foot pain and wound. EXAM: RIGHT FOOT COMPLETE - 3+ VIEW COMPARISON:  12/28/2015 FINDINGS: Soft tissue swelling in the forefoot especially distally. No bony destructive findings characteristic of osteomyelitis are identified. No obvious gas tracking in the soft tissues. Suspected ulceration adjacent to the base of the fifth metatarsal. Dorsal spurring of the talar head. IMPRESSION: 1. Continued soft tissue swelling in the forefoot. I do not see definite gas tracking in the soft tissues and no bony destructive findings characteristic of osteomyelitis are identified. 2. Suspected ulceration adjacent to the base the fifth metatarsal. 3. If further imaging workup for osteomyelitis or abscess is identified, consider MRI. Electronically Signed   By: Van Clines M.D.   On: 01/12/2016 13:14   Dg Foot Complete Right  12/28/2015  CLINICAL DATA:  Nonhealing wound.  Diabetes. EXAM: RIGHT FOOT COMPLETE - 3+ VIEW COMPARISON:  None. FINDINGS: Soft tissue wound noted along the lateral aspect of the foot. No underlying foreign body . No acute bony or joint abnormality identified. No evidence of fracture or dislocation . IMPRESSION: Soft tissue wound noted along the lateral aspect of foot. No radiopaque foreign body. No acute bony abnormality. Diffuse degenerative change. Electronically Signed   By: Marcello Moores  Register   On: 12/28/2015 12:35    Assessment/Plan: Donivan Thammavong is a 60 y.o. male DM, chronic lymphedema, tobacco abuse, PAD s/p bypass on L admitted with worsening of R LE foot wound. HAd been following with wound center. At admit had wbc 13.6, wound cxwith enterobacter (grm + cocci also noted on GS) .  MRI showed osteomyelitis 5th Metatarsal and also possible cuboid and navicular. ESR 40, CRP 9.6 1/28 underwent I and D and removal of prox 5th MT - abx beads placed and wound vac placed.  Bone cx pending. MRSA screen is negative Being seen by Vascular and for angiogram.   Recommendations Dcd vanco  Cont zosyn pending ID of bone culture.  Given concern for underlying remaining osteomyelitis will need IV abx for likely 4-6 weeks. Would likely continue zosyn for broad coverage including anaerobes given extent of initial wound and foul odor with it.  Ordered picc and discussed with patient  Thank you very much for the consult. Will follow with you.  [place], [place]   01/18/2016, 1:57 PM

## 2016-01-18 NOTE — Progress Notes (Signed)
Patient Demographics  Dennis Fitzgerald, is a 60 y.o. male   MRN: [id number]   DOB - 10-Oct-1956  Admit Date - 01/14/2016    Outpatient Primary MD for the patient is Leanna Sato, MD  Consult requested in the Hospital by Houston Siren, MD, On 01/18/2016   With History of -  Past Medical History  Diagnosis Date  . Diabetes mellitus without complication (HCC)   . Lymphedema   . COPD (chronic obstructive pulmonary disease) [facility])       Past Surgical History  Procedure Laterality Date  . Incision and drainage of wound Right 01/16/2016    Procedure: IRRIGATION AND DEBRIDEMENT WOUND;  Surgeon: Recardo Evangelist, DPM;  Location: [facility] ORS;  Service: Podiatry;  Laterality: Right;  . Application of wound vac Right 01/16/2016    Procedure: APPLICATION OF WOUND VAC;  Surgeon: Recardo Evangelist, DPM;  Location: [facility] ORS;  Service: Podiatry;  Laterality: Right;    in for   Chief Complaint  Patient presents with  . Wound Infection     HPI  Dennis Fitzgerald  is a 60 y.o. male, patient underwent surgical debridement of necrotic gangrenous skin and soft tissue and infected bone to the fifth metatarsal base and proximal shaft. Wound VAC was applied afterwards. Previous to that he been treated for a wound on the lateral fifth metatarsal area for 4-5 weeks.    Review of Systems    In addition to the HPI above,  No Fever-chills, No Headache, No changes with Vision or hearing, No problems swallowing food or Liquids, No Chest pain, Cough or Shortness of Breath, No Abdominal pain, No Nausea or Vommitting, Bowel movements are regular, No Blood in stool or Urine, No dysuria, No new skin rashes or bruises, No new joints pains-aches,  No new weakness, tingling, numbness in any extremity, No recent weight gain or loss, No polyuria,  polydypsia or polyphagia, No significant Mental Stressors.  A full 10 point Review of Systems was done, except as stated above, all other Review of Systems were negative.   Social History Social History  Substance Use Topics  . Smoking status: Current Every Day Smoker  . Smokeless tobacco: Not on file  . Alcohol Use: No     Family History History reviewed. No pertinent family history.   Prior to Admission medications   Medication Sig Start Date End Date Taking? Authorizing Provider  albuterol (PROVENTIL HFA;VENTOLIN HFA) 108 (90 Base) MCG/ACT inhaler Inhale 2 puffs into the lungs every 4 (four) hours as needed for wheezing or shortness of breath.   Yes Historical Provider, MD  aspirin EC 81 MG tablet Take 81 mg by mouth every evening.   Yes Historical Provider, MD  atorvastatin (LIPITOR) 40 MG tablet Take 40 mg by mouth every evening.   Yes Historical Provider, MD  ciprofloxacin (CIPRO) 500 MG tablet Take 1 tablet (500 mg total) by mouth 2 (two) times daily. 01/12/16  Yes Sharman Cheek, MD  clindamycin (CLEOCIN) 150 MG capsule Take 3 capsules (450 mg total) by mouth 3 (three) times daily. 01/12/16  Yes Sharman Cheek, MD  clopidogrel (PLAVIX) 75 MG tablet Take 75 mg by mouth every evening.   Yes Historical  Provider, MD  collagenase (SANTYL) ointment Apply 1 application topically 2 (two) times daily.   Yes Historical Provider, MD  furosemide (LASIX) 40 MG tablet Take 40 mg by mouth 2 (two) times daily.   Yes Historical Provider, MD  insulin NPH-regular Human (NOVOLIN 70/30) (70-30) 100 UNIT/ML injection Inject 50-55 Units into the skin 2 (two) times daily. Pt uses 55 units in the morning and 50 at night.   Yes Historical Provider, MD  linagliptin (TRADJENTA) 5 MG TABS tablet Take 5 mg by mouth every evening.    Yes Historical Provider, MD  lisinopril (PRINIVIL,ZESTRIL) 10 MG tablet Take 10 mg by mouth every evening.   Yes Historical Provider, MD  metFORMIN (GLUCOPHAGE) 1000 MG  tablet Take 1,000 mg by mouth 2 (two) times daily.   Yes Historical Provider, MD  potassium chloride (K-DUR,KLOR-CON) 10 MEQ tablet Take 10 mEq by mouth every evening.   Yes Historical Provider, MD  Umeclidinium-Vilanterol (ANORO ELLIPTA) 62.5-25 MCG/INH AEPB Inhale 1 puff into the lungs every evening.    Yes Historical Provider, MD    Anti-infectives    Start     Dose/Rate Route Frequency Ordered Stop   01/16/16 1200  vancomycin (VANCOCIN) 1,250 mg in sodium chloride 0.9 % 250 mL IVPB  Status:  Discontinued     1,250 mg 166.7 mL/hr over 90 Minutes Intravenous Every 8 hours 01/16/16 0430 01/18/16 1401   01/14/16 2000  vancomycin (VANCOCIN) IVPB 1000 mg/200 mL premix  Status:  Discontinued     1,000 mg 200 mL/hr over 60 Minutes Intravenous Every 8 hours 01/14/16 1636 01/16/16 0430   01/14/16 2000  piperacillin-tazobactam (ZOSYN) IVPB 3.375 g     3.375 g 12.5 mL/hr over 240 Minutes Intravenous 3 times per day 01/14/16 1636     01/14/16 1245  piperacillin-tazobactam (ZOSYN) IVPB 3.375 g     3.375 g 12.5 mL/hr over 240 Minutes Intravenous  Once 01/14/16 1241 01/14/16 1524   01/14/16 1245  vancomycin (VANCOCIN) IVPB 1000 mg/200 mL premix     1,000 mg 200 mL/hr over 60 Minutes Intravenous  Once 01/14/16 1244 01/14/16 1440      Scheduled Meds: . atorvastatin  40 mg Oral QPM  . enoxaparin (LOVENOX) injection  40 mg Subcutaneous Q24H  . furosemide  40 mg Oral BID  . insulin aspart  0-20 Units Subcutaneous TID WC  . insulin aspart  0-5 Units Subcutaneous QHS  . insulin aspart protamine- aspart  45 Units Subcutaneous BID WC  . linagliptin  5 mg Oral QPM  . lisinopril  10 mg Oral QPM  . piperacillin-tazobactam (ZOSYN)  IV  3.375 g Intravenous 3 times per day  . potassium chloride  10 mEq Oral QPM  . salmeterol  1 puff Inhalation BID  . tiotropium  18 mcg Inhalation Daily   Continuous Infusions:  PRN Meds:.acetaminophen, albuterol, magic mouthwash, morphine injection, oxyCODONE  No Known  Allergies  Physical Exam  Vitals  Blood pressure 150/80, pulse 92, temperature 99.7 F (37.6 C), temperature source Oral, resp. rate 18, height  (1.803 m), weight 113.399 kg (250 lb), SpO2 96 %.  Lower Extremity exam: Wound VAC removed and the lateral ulcerations inspected. Incision margin looks pretty stable but there is some necrotic tissue still in the wound at this point. Multiple antibiotic beads are present. Wound VAC is intact and functional. Swelling and lymphedema and venous stasis edema is markedly improved at this point. No progressive cellulitis or redness is noted at this area. Data Review  CBC  Recent Labs Lab 01/12/16 1328 01/14/16 1029 01/15/16 0451 01/16/16 0406  WBC 13.6* 12.1* 9.3 7.3  HGB 14.1 13.8 13.0 13.0  HCT 42.0 41.1 38.5* 38.4*  PLT 235 255 232 218  MCV 83.5 83.0 83.9 82.6  MCH 28.1 27.8 28.4 27.9  MCHC 33.6 33.5 33.8 33.7  RDW 14.8* 15.1* 14.9* 14.9*  LYMPHSABS 0.9*  --   --   --   MONOABS 0.8  --   --   --   EOSABS 0.2  --   --   --   BASOSABS 0.1  --   --   --    ------------------------------------------------------------------------------------------------------------------  Chemistries   Recent Labs Lab 01/12/16 1328 01/14/16 1029 01/15/16 0451 01/16/16 0406  NA 128* 127* 130* 133*  K 3.9 4.5 3.4* 3.5  CL 94* 90* 96* 99*  CO2 GLUCOSE 308* 238* 93 97  BUN CREATININE 0.67 0.79 0.68 0.77  CALCIUM 8.5* 8.5* 8.2* 8.2*   ------------------------------------------------------------------------------------------------------------------ estimated creatinine clearance is 127.3 mL/min (by C-G formula based on Cr of 0.77). ------------- --------------------------------------------------------------------------------------------------------------- Assessment & Plan: Wound VAC was removed and the wound inspected. Still shows some diseased tissue superficially in the wound. Some granulation tissue forming in the  regions. Antibiotic beads present. Plan: Patient scheduled for an angioplasty tomorrow and I would like to debride this again at some point day or 2 after he gets that angioplasty done. Wound is a significant wound and his weightbearing status will be compromise once he is stable. Likely go to rehabilitation upon discharge. Hopefully I can debride this further and the Wednesday or Thursday.  Active Problems:   Diabetic foot ulcer (HCC)  Family Communication: Plan discussed with patient .   Epimenio Sarin M.D on 01/18/2016 at 6:41 PM  Thank you for the consult, we will follow the patient with you in the Hospital.

## 2016-01-18 NOTE — Progress Notes (Signed)
[facility]  Daily Progress Note   Subjective  - 2 Days Post-Op  Patient denies right foot pain, no fever chills  Objective Filed Vitals:   01/17/16 1335 01/17/16 2137 01/18/16 0551 01/18/16 1313  BP: 121/73 126/64 136/73 150/80  Pulse: 90 85 84 92  Temp: 99.8 F (37.7 C) 98.2 F (36.8 C) 98.7 F (37.1 C) 99.7 F (37.6 C)  TempSrc: Oral Oral Oral Oral  Resp: Height:      Weight:      SpO2: 91% 92% 95% 96%    Intake/Output Summary (Last 24 hours) at 01/18/16 1914 Last data filed at 01/18/16 1700  Gross per 24 hour  Intake   1240 ml  Output   3365 ml  Net  -2125 ml    PULM  Normal effort , no use of accessory muscles CV  No JVD, RRR Abd      No distended, nontender VASC  wound right foot with some devitalized tissue pedal pulses are nonpalpable  Laboratory CBC    Component Value Date/Time   WBC 7.3 01/16/2016 0406   HGB 13.0 01/16/2016 0406   HCT 38.4* 01/16/2016 0406   PLT 218 01/16/2016 0406    BMET    Component Value Date/Time   NA 133* 01/16/2016 0406   K 3.5 01/16/2016 0406   CL 99* 01/16/2016 0406   CO2 29 01/16/2016 0406   GLUCOSE 97 01/16/2016 0406   BUN 6 01/16/2016 0406   CREATININE 0.77 01/16/2016 0406   CALCIUM 8.2* 01/16/2016 0406   GFRNONAA >60 01/16/2016 0406   GFRAA >60 01/16/2016 0406    Assessment/Planning:   1. Atherosclerotic occlusive disease bilateral lower extremities with ulceration of the right foot. I have discussed with the patient the need for angiography with the hope for intervention to revascularize his right lower extremity for limb salvage. At the present time his wound is stable and he is receiving antibiotics. I will plan for angiography on Wednesday    Renford Dills  01/18/2016, 7:14 PM

## 2016-01-19 ENCOUNTER — Inpatient Hospital Stay: Payer: Medicaid Other

## 2016-01-19 LAB — WOUND CULTURE

## 2016-01-19 LAB — CBC WITH DIFFERENTIAL/PLATELET
BASOS ABS: 0 10*3/uL (ref 0–0.1)
BASOS PCT: 0 %
EOS ABS: 0.3 10*3/uL (ref 0–0.7)
EOS PCT: 7 %
HCT: 37.8 % — ABNORMAL LOW (ref 40.0–52.0)
Hemoglobin: 12.7 g/dL — ABNORMAL LOW (ref 13.0–18.0)
Lymphocytes Relative: 7 %
Lymphs Abs: 0.3 10*3/uL — ABNORMAL LOW (ref 1.0–3.6)
MCH: 28.3 pg (ref 26.0–34.0)
MCHC: 33.7 g/dL (ref 32.0–36.0)
MCV: 83.9 fL (ref 80.0–100.0)
MONO ABS: 0.5 10*3/uL (ref 0.2–1.0)
MONOS PCT: 11 %
NEUTROS ABS: 3.5 10*3/uL (ref 1.4–6.5)
Neutrophils Relative %: 75 %
PLATELETS: 180 10*3/uL (ref 150–440)
RBC: 4.5 MIL/uL (ref 4.40–5.90)
RDW: 15.2 % — AB (ref 11.5–14.5)
WBC: 4.7 10*3/uL (ref 3.8–10.6)

## 2016-01-19 LAB — GLUCOSE, CAPILLARY
GLUCOSE-CAPILLARY: 96 mg/dL (ref 65–99)
GLUCOSE-CAPILLARY: 50 mg/dL — AB (ref 65–99)
GLUCOSE-CAPILLARY: 58 mg/dL — AB (ref 65–99)
GLUCOSE-CAPILLARY: 77 mg/dL (ref 65–99)
GLUCOSE-CAPILLARY: 115 mg/dL — AB (ref 65–99)
Glucose-Capillary: 50 mg/dL — ABNORMAL LOW (ref 65–99)
Glucose-Capillary: 53 mg/dL — ABNORMAL LOW (ref 65–99)
Glucose-Capillary: 60 mg/dL — ABNORMAL LOW (ref 65–99)
Glucose-Capillary: 71 mg/dL (ref 65–99)

## 2016-01-19 LAB — CREATININE, SERUM: CREATININE: 0.76 mg/dL (ref 0.61–1.24)

## 2016-01-19 LAB — SURGICAL PATHOLOGY

## 2016-01-19 MED ORDER — INSULIN ASPART PROT & ASPART (70-30 MIX) 100 UNIT/ML ~~LOC~~ SUSP
30.0000 [IU] | Freq: Two times a day (BID) | SUBCUTANEOUS | Status: DC
  Administered 2016-01-19: 30 [IU] via SUBCUTANEOUS
  Filled 2016-01-19: qty 30

## 2016-01-19 MED ORDER — OXYCODONE HCL 5 MG PO TABS
5.0000 mg | ORAL_TABLET | ORAL | Status: DC | PRN
  Administered 2016-01-19 (×2): 10 mg via ORAL
  Administered 2016-01-20: 5 mg via ORAL
  Administered 2016-01-20 – 2016-01-25 (×22): 10 mg via ORAL
  Administered 2016-01-25: 5 mg via ORAL
  Administered 2016-01-25: 10 mg via ORAL
  Administered 2016-01-26: 5 mg via ORAL
  Administered 2016-01-26 (×3): 10 mg via ORAL
  Filled 2016-01-19 (×3): qty 2
  Filled 2016-01-19: qty 1
  Filled 2016-01-19 (×2): qty 2
  Filled 2016-01-19: qty 1
  Filled 2016-01-19 (×24): qty 2

## 2016-01-19 MED ORDER — DEXTROSE-NACL 5-0.45 % IV SOLN
INTRAVENOUS | Status: AC
  Administered 2016-01-19: 22:00:00 via INTRAVENOUS

## 2016-01-19 NOTE — Progress Notes (Signed)
Pt's blood sugar is 58, of chocolate milk was given. Will recheck blood sugar. Pt is asymptomatic, able to answer all questions.

## 2016-01-19 NOTE — Progress Notes (Signed)
[facility] INFECTIOUS DISEASE PROGRESS NOTE Date of Admission:  01/14/2016     ID: Anquan Azzarello is a 60 y.o. male with diabetic foot infection and osteomyelitis  Active Problems:   Diabetic foot ulcer ([place])   Subjective:  Denies pain. No fevers,  ROS  Eleven systems are reviewed and negative except per hpi  Medications:  Antibiotics Given (last 72 hours)    Date/Time Action Medication Dose Rate   01/16/16 1750 Given   piperacillin-tazobactam (ZOSYN) IVPB 3.375 g 3.375 g 12.5 mL/hr   01/16/16 2102 Given   vancomycin (VANCOCIN) 1,250 mg in sodium chloride 0.9 % 250 mL IVPB 1,250 mg 166.7 mL/hr   01/17/16 0400 Given   vancomycin (VANCOCIN) 1,250 mg in sodium chloride 0.9 % 250 mL IVPB 1,250 mg 166.7 mL/hr   01/17/16 0600 Given   piperacillin-tazobactam (ZOSYN) IVPB 3.375 g 3.375 g 12.5 mL/hr   01/17/16 1252 Given   vancomycin (VANCOCIN) 1,250 mg in sodium chloride 0.9 % 250 mL IVPB 1,250 mg 166.7 mL/hr   01/17/16 2009 Given   vancomycin (VANCOCIN) 1,250 mg in sodium chloride 0.9 % 250 mL IVPB 1,250 mg 166.7 mL/hr   01/17/16 2251 Given   piperacillin-tazobactam (ZOSYN) IVPB 3.375 g 3.375 g 12.5 mL/hr   01/18/16 0419 Given   vancomycin (VANCOCIN) 1,250 mg in sodium chloride 0.9 % 250 mL IVPB 1,250 mg 166.7 mL/hr   01/18/16 0623 Given   piperacillin-tazobactam (ZOSYN) IVPB 3.375 g 3.375 g 12.5 mL/hr   01/18/16 1237 Given   piperacillin-tazobactam (ZOSYN) IVPB 3.375 g 3.375 g 12.5 mL/hr   01/18/16 1237 Given   vancomycin (VANCOCIN) 1,250 mg in sodium chloride 0.9 % 250 mL IVPB 1,250 mg 166.7 mL/hr   01/18/16 2109 Given   piperacillin-tazobactam (ZOSYN) IVPB 3.375 g 3.375 g 12.5 mL/hr   01/19/16 0615 Given   piperacillin-tazobactam (ZOSYN) IVPB 3.375 g 3.375 g 12.5 mL/hr     . atorvastatin  40 mg Oral QPM  . enoxaparin (LOVENOX) injection  40 mg Subcutaneous Q24H  . furosemide  40 mg Oral BID  . insulin aspart  0-20 Units Subcutaneous TID WC  . insulin aspart  0-5 Units  Subcutaneous QHS  . insulin aspart protamine- aspart  30 Units Subcutaneous BID WC  . linagliptin  5 mg Oral QPM  . lisinopril  10 mg Oral QPM  . piperacillin-tazobactam (ZOSYN)  IV  3.375 g Intravenous 3 times per day  . potassium chloride  10 mEq Oral QPM  . salmeterol  1 puff Inhalation BID  . tiotropium  18 mcg Inhalation Daily    Objective: Vital signs in last 24 hours: Temp:  [98.3 F (36.8 C)-98.9 F (37.2 C)] 98.3 F (36.8 C) (01/31 1055) Pulse Rate:  [84-88] 84 (01/31 1055) Resp:  [18] 18 (01/31 1055) BP: (136)/(71-74) 136/74 mmHg (01/31 1055) SpO2:  [95 %-97 %] 95 % (01/31 1055) Constitutional: dishelved, obese No distress.  HENT: anicteric  Mouth/Throat: Oropharynx is clear and moist. No oropharyngeal exudate.  Cardiovascular: Normal rate, regular rhythm  Pulmonary/Chest: Effort normal and breath sounds normal. No respiratory distress. He has no wheezes.  Abdominal: Soft. Bowel sounds are normal. He exhibits no distension. There is no tenderness.  Lymphadenopathy: He has no cervical adenopathy.  Neurological: He is alert and oriented to person, place, and time.  Ext impressive bilateral LE lymphedema Skin: RLE wrapped post op Psychiatric: He has a normal mood and affect. His behavior is normal.   Lab Results  Recent Labs  01/19/16 [phone]  WBC 4.7  HGB 12.7*  HCT 37.8*  CREATININE 0.76    Microbiology: Results for orders placed or performed during the hospital encounter of 01/14/16  Wound culture     Status: None   Collection Time: 01/14/16  7:16 PM  Result Value Ref Range Status   Specimen Description FOOT  Final   Special Requests NONE  Final   Gram Stain   Final    FEW WBC SEEN RARE GRAM POSITIVE COCCI IN PAIRS FEW GRAM VARIABLE ROD    Culture LIGHT GROWTH ENTEROBACTER CLOACAE  Final   Report Status 01/18/2016 FINAL  Final   Organism ID, Bacteria ENTEROBACTER CLOACAE  Final      Susceptibility   Enterobacter cloacae - MIC*    PIP/TAZO  Value in next row Sensitive      SENSITIVE<=4    CEFTAZIDIME Value in next row Sensitive      SENSITIVE<=1    CEFTRIAXONE Value in next row Sensitive      SENSITIVE<=1    IMIPENEM Value in next row Sensitive      SENSITIVE<=0.25    GENTAMICIN Value in next row Sensitive      SENSITIVE<=1    CIPROFLOXACIN Value in next row Sensitive      SENSITIVE<=0.25    TRIMETH/SULFA Value in next row Sensitive      SENSITIVE<=20    CEFEPIME Value in next row Sensitive      SENSITIVE<=1    * LIGHT GROWTH ENTEROBACTER CLOACAE  MRSA PCR Screening     Status: None   Collection Time: 01/15/16 11:40 PM  Result Value Ref Range Status   MRSA by PCR NEGATIVE NEGATIVE Final    Comment:        The GeneXpert MRSA Assay (FDA approved for NASAL specimens only), is one component of a comprehensive MRSA colonization surveillance program. It is not intended to diagnose MRSA infection nor to guide or monitor treatment for MRSA infections.   Anaerobic culture     Status: None (Preliminary result)   Collection Time: 01/16/16 10:22 AM  Result Value Ref Range Status   Specimen Description WOUND  Final   Special Requests NONE  Final   Culture NO ANAEROBES ISOLATED  Final   Report Status PENDING  Incomplete  Wound culture     Status: None   Collection Time: 01/16/16 10:22 AM  Result Value Ref Range Status   Specimen Description WOUND  Final   Special Requests NONE  Final   Gram Stain RARE WBC SEEN NO ORGANISMS SEEN   Final   Culture LIGHT GROWTH ENTEROBACTER CLOACAE  Final   Report Status 01/19/2016 FINAL  Final   Organism ID, Bacteria ENTEROBACTER CLOACAE  Final      Susceptibility   Enterobacter cloacae - MIC*    PIP/TAZO Value in next row Sensitive      SENSITIVE<=4    CEFTAZIDIME Value in next row Sensitive      SENSITIVE<=1    CEFTRIAXONE Value in next row Sensitive      SENSITIVE<=1    CEFEPIME Value in next row Sensitive      SENSITIVE<=1    IMIPENEM Value in next row Sensitive       SENSITIVE<=0.25    GENTAMICIN Value in next row Sensitive      SENSITIVE<=1    CIPROFLOXACIN Value in next row Sensitive      SENSITIVE<=0.25    TRIMETH/SULFA Value in next row Sensitive      SENSITIVE<=20    *  LIGHT GROWTH ENTEROBACTER CLOACAE    Studies/Results: Mr Foot Right W Wo Contrast  01/16/2016  CLINICAL DATA:  Foot ulcer near the heel, starting in December 2016, worsening recently. Diabetes. EXAM: MRI OF THE RIGHT HINDFOOT WITHOUT AND WITH CONTRAST TECHNIQUE: Multiplanar, multisequence MR imaging was performed both before and after administration of intravenous contrast. CONTRAST:  52m MULTIHANCE GADOBENATE DIMEGLUMINE 529 MG/ML IV SOLN COMPARISON:  01/14/2016 radiograph FINDINGS: Diffuse subcutaneous edema in the distal calf and ankle noted. There is an ulceration adjacent to the base of fifth metatarsal. Underlying this ulceration there is a 7.6 by 3.9 by 2.0 cm volume of hypoenhancing and likely necrotic tissue. This extends down to the base of the fifth metatarsal which demonstrates diffuse abnormal edema and enhancement compatible with osteomyelitis of the fifth metatarsal. Edema and enhancement is most severe at the metatarsal base but extends in the metatarsal shaft towards the metatarsal head. The peroneus brevis appears to still inserts on the edematous base of the fifth metatarsal in the peroneus longus skirts the margin of the necrotic tissue. There is some tissue necrosis and abnormal edema and enhancement in the abductor digiti minimi muscle as it passes below the fifth metatarsal. The area of soft tissue necrosis abuts the cuboid and there is some faintly increased T2 signal and enhancement in this adjacent portion of the cuboid near the peroneus longus tendon which could reflect an early osteomyelitis for example images 11 through 14 of series 15. There is also abnormal edema and enhancement in the bony articular confluence of the navicular, middle and medial cuneiform, and  cuboid as shown on images 16 through 23 of series 11. Of these the most severely involved is the navicular. I am uncertain whether this edema and enhancement is due to early osteomyelitis or underlying arthropathy. Lisfranc ligament intact. There is peroneus longus and peroneus brevis tenosynovitis. There is 5 mm non-fragmented osteochondral lesion of the lateral talar dome on image 22 series 14. Cellulitis noted in the foot especially severe in the vicinity of the ulceration. Please note that the region of the metatarsal heads and forefoot was not included on today's exam. IMPRESSION: 1. 7.6 by 3.9 by 2.0 cm volume of hypoenhancing necrotic tissue underlying the ulceration near the base of the fifth metatarsal osteomyelitis of the base of the fifth metatarsal extending to the margin of the fifth metatarsal. Diffuse osteomyelitis in the fifth metatarsal especially severe at its base. Possible early osteomyelitis involving the inferior-lateral cuboid, and also in the navicular. Questionable early osteomyelitis in the middle and medial cuneiforms. 2. Diffuse infiltrative edema in the calf and ankle compatible with cellulitis. 3. Incidental 5 mm non-fragmented osteochondral lesion of the lateral talar dome. Electronically Signed   By: WVan ClinesM.D.   On: 01/16/2016 08:14   Dg Foot Complete Right  01/14/2016  CLINICAL DATA:  History of poorly controlled diabetes, chronic lymphedema, dorsal foot ulcer for 7 weeks. Currently on antibiotics but with persistent symptoms. EXAM: RIGHT FOOT COMPLETE - 3+ VIEW COMPARISON:  Right foot plain film dated 01/12/2016 and right foot plain film dated 12/28/2015. FINDINGS: Again noted is an apparent soft tissue defect overlying the right fifth metatarsal base. Compared to earlier plain film from 12/28/2015, there appear to be developing demineralization within the fifth metatarsal base and developing defects along the lateral cortex margin of the fifth metatarsal base,  highly suspicious for developing osteomyelitis. Remainder of the osseous structures of the right foot appear intact and normal in mineralization. No soft tissue gas  seen. IMPRESSION: 1. Presumed soft tissue defect/ulceration overlying the base of the right fifth metatarsal base. 2. Probable developing demineralization and cortex discontinuity along the lateral margin of the underlying fifth metatarsal base, highly suspicious for developing osteomyelitis. Would consider MRI for further characterization. These results were called by telephone at the time of interpretation on 01/14/2016 at 1:34 pm to Dr. Charlotte Crumb , who verbally acknowledged these results. Electronically Signed   By: Franki Cabot M.D.   On: 01/14/2016 13:36   Dg Foot Complete Right  01/12/2016  CLINICAL DATA:  Right foot pain and wound. EXAM: RIGHT FOOT COMPLETE - 3+ VIEW COMPARISON:  12/28/2015 FINDINGS: Soft tissue swelling in the forefoot especially distally. No bony destructive findings characteristic of osteomyelitis are identified. No obvious gas tracking in the soft tissues. Suspected ulceration adjacent to the base of the fifth metatarsal. Dorsal spurring of the talar head. IMPRESSION: 1. Continued soft tissue swelling in the forefoot. I do not see definite gas tracking in the soft tissues and no bony destructive findings characteristic of osteomyelitis are identified. 2. Suspected ulceration adjacent to the base the fifth metatarsal. 3. If further imaging workup for osteomyelitis or abscess is identified, consider MRI. Electronically Signed   By: Van Clines M.D.   On: 01/12/2016 13:14   Dg Foot Complete Right  12/28/2015  CLINICAL DATA:  Nonhealing wound.  Diabetes. EXAM: RIGHT FOOT COMPLETE - 3+ VIEW COMPARISON:  None. FINDINGS: Soft tissue wound noted along the lateral aspect of the foot. No underlying foreign body . No acute bony or joint abnormality identified. No evidence of fracture or dislocation . IMPRESSION: Soft  tissue wound noted along the lateral aspect of foot. No radiopaque foreign body. No acute bony abnormality. Diffuse degenerative change. Electronically Signed   By: Marcello Moores  Register   On: 12/28/2015 12:35    Assessment/Plan: Daaron Dimarco is a 60 y.o. male DM, chronic lymphedema, tobacco abuse, PAD s/p bypass on L admitted with worsening of R LE foot wound. HAd been following with wound center. At admit had wbc 13.6, wound cx with enterobacter (grm + cocci also noted on GS) .  MRI showed osteomyelitis 5th Metatarsal and also possible cuboid and navicular. ESR 40, CRP 9.6 1/28 underwent I and D and removal of prox 5th MT - abx beads placed and wound vac placed.  Bone cx . MRSA screen is negative Being seen by Vascular and for angiogram tomorrow  Recommendations Cont zosyn for now. Given concern for underlying remaining osteomyelitis will need IV abx for likely 4-6 weeks. Would likely continue zosyn for broad coverage including anaerobes given extent of initial wound and foul odor with it.  Ordered picc and discussed with patient  Thank you very much for the consult. Will follow with you.  [place], [place]   01/19/2016, 2:07 PM

## 2016-01-19 NOTE — Progress Notes (Signed)
Pt's blood sugar was 50. 4oz of OJ was given. Will recheck in 15 minutes. Spoke with DM about drop in blood sugar. Educated pt to let us know if he plans on not eating very much before giving 70/30.

## 2016-01-19 NOTE — Progress Notes (Signed)
[facility] - [place] at [facility]   PATIENT NAME: Dennis Fitzgerald    MR#:  [id number]  DATE OF BIRTH:  November 23, 1956  SUBJECTIVE:    Pt. Here due to a right foot diabetic foot ulcer and noted to have osteomyelitis of the right fifth metatarsal. Patient is status post removal of the infected fifth metatarsal proximal half and debridement of the underlying soft tissue infection POD # 3. Cont. Wound vac.  Was a bit hypoglycemic this a.m. But improved since then.  Asymptomatic.   REVIEW OF SYSTEMS:    Review of Systems  Constitutional: Negative for fever and chills.  HENT: Negative for congestion and tinnitus.   Eyes: Negative for blurred vision and double vision.  Respiratory: Negative for cough, shortness of breath and wheezing.   Cardiovascular: Negative for chest pain, orthopnea and PND.  Gastrointestinal: Negative for nausea, vomiting, abdominal pain and diarrhea.  Genitourinary: Negative for dysuria and hematuria.  Neurological: Negative for dizziness, sensory change and focal weakness.  All other systems reviewed and are negative.   Nutrition: Carb modified.  Tolerating Diet: yes Tolerating PT: Await Eval.   DRUG ALLERGIES:  No Known Allergies  VITALS:  Blood pressure 136/74, pulse 84, temperature 98.3 F (36.8 C), temperature source Oral, resp. rate 18, height  (1.803 m), weight 113.399 kg (250 lb), SpO2 95 %.  PHYSICAL EXAMINATION:   Physical Exam  GENERAL:  60 y.o.-year-old obese patient lying in the bed in no acute distress.  EYES: Pupils equal, round, reactive to light and accommodation. No scleral icterus. Extraocular muscles intact.  HEENT: Head atraumatic, normocephalic. Oropharynx and nasopharynx clear.  NECK:  Supple, no jugular venous distention. No thyroid enlargement, no tenderness.  LUNGS: Normal breath sounds bilaterally, no wheezing, rales, rhonchi. No use of accessory muscles of respiration.  CARDIOVASCULAR: S1, S2 normal.  No murmurs, rubs, or gallops.  ABDOMEN: Soft, nontender, nondistended. Bowel sounds present. No organomegaly or mass.  EXTREMITIES: No cyanosis, clubbing, chronic lymphedema b/l.  Right Lower ext. wound dressing w/ wound vac in place. No acute drainage noted.     NEUROLOGIC: Cranial nerves II through XII are intact. No focal Motor or sensory deficits b/l.   PSYCHIATRIC: The patient is alert and oriented x 3. Good affect.    SKIN: No obvious rash, lesion, or ulcer.    LABORATORY PANEL:   CBC  Recent Labs Lab 01/19/16 0552  WBC 4.7  HGB 12.7*  HCT 37.8*  PLT 180   ------------------------------------------------------------------------------------------------------------------  Chemistries   Recent Labs Lab 01/16/16 0406 01/19/16 0552  NA 133*  --   K 3.5  --   CL 99*  --   CO2 29  --   GLUCOSE 97  --   BUN 6  --   CREATININE 0.77 0.76  CALCIUM 8.2*  --    ------------------------------------------------------------------------------------------------------------------  Cardiac Enzymes No results for input(s): TROPONINI in the last 168 hours. ------------------------------------------------------------------------------------------------------------------  RADIOLOGY:  No results found.   ASSESSMENT AND PLAN:   60 year old male with past history of diabetes type 2, chronic lymphedema, COPD, hyperlipidemia, hypertension who presented to the hospital due to right lower extremity diabetic foot ulcer.  #1 right lower extremity diabetic foot ulcer/Osteomyelitis-appreciate podiatry help and patient is status post debridement and partial removal of the infected fifth metatarsal on the right foot POD # 3. -Continue IV Zosyn. Wound cultures growing Enterobacter presently. -Appreciate infectious disease input and patient will likely need 4-6 weeks of antibiotics. Await PICC Line placement.  -seen  by vascular surgery and plan for Lower ext. Angiogram tomorrow.   #2 type 2  diabetes without complication-patient was a bit hypoglycemic this morning but asymptomatic. -We'll reduce insulin 7030 dose, continue Tradjenta. Follow blood sugars.  #3 hypertension-continue lisinopril  #4 COPD-without acute exacerbation. Continue Spiriva, Serevent.  #5 hyperlipidemia-continue atorvastatin.  Await physical therapy evaluation.   All the records are reviewed and case discussed with Care Management/Social Workerr. Management plans discussed with the patient, family and they are in agreement.  CODE STATUS: Full  DVT Prophylaxis: Lovenox  TOTAL TIME TAKING CARE OF THIS PATIENT: 25 minutes.   POSSIBLE D/C IN 2-3 DAYS, DEPENDING ON CLINICAL CONDITION.   Houston Siren M.D on 01/19/2016 at 3:22 PM  Between 7am to 6pm - Pager - [phone]  After 6pm go to [email or web address] - password [email or web address] [facility]  [place] [facility]  Office  [phone]  CC: Primary care physician; Leanna Sato, MD

## 2016-01-19 NOTE — Progress Notes (Signed)
Inpatient Diabetes Program Recommendations  AACE/ADA: New Consensus Statement on Inpatient Glycemic Control (2015)  Target Ranges:  Prepandial:   less than 140 mg/dL      Peak postprandial:   less than 180 mg/dL (1-2 hours)      Critically ill patients:  140 - 180 mg/dL  Results for CASY, BRUNETTO (MRN [id number]) as of 01/19/2016 12:53  Ref. Range 01/18/2016 08:02 01/18/2016 12:14 01/18/2016 16:54 01/18/2016 21:33 01/19/2016 07:46 01/19/2016 11:31 01/19/2016 11:54 01/19/2016 12:40  Glucose-Capillary Latest Ref Range: 65-99 mg/dL 409 (H) 811 (H) 914 (H) 113 (H) 96 50 (L) 50 (L) 58 (L)   Review of Glycemic Control  Current orders for Inpatient glycemic control: 70/30 45 units BID, Novolog 0-20 units TID with meals, Novolog 0-5 units HS, Tradjenta 5 mg QPM  Inpatient Diabetes Program Recommendations: Insulin - Basal: In reviewing the chart, noted patient did NOT receive am dose of 70/30 on 01/18/16 but did receive 70/30 45 units with supper on 01/18/16. Fasting glucose 96 mg/dl this morning and patient was given 70/30 45 units at 8:45 am. RN reports that patient did eat breakfast this morning and glucose down to 50 mg/dl at 78:29 am. Please consider decreasing 70/30 to 30 units BID (with breakfast and supper). 70/30 should only be given if patient is eating. If patient is not going to be eating, MD may want to consider discontinuing 70/30 and use Levemir or Lantus as a basal insulin.  Thanks, Orlando Penner, RN, MSN, CDE Diabetes Coordinator Inpatient Diabetes Program [phone] (Team Pager from 8am to 5pm) [phone] ([facility] office) [phone] [facility] office) [phone] [facility] office)

## 2016-01-19 NOTE — Plan of Care (Signed)
Problem: Pain Managment: Goal: General experience of comfort will improve Outcome: Progressing Pt is taking only PO pain medications

## 2016-01-19 NOTE — Care Management (Signed)
Patient lives alone.  He does not want to pursue skilled nursing placement and says that his doctor does not want patient to go to a facility out of the county.  (Patient has medicaid). Discussed IV  antibiotic therapy but thought nurses would come to his house everyday to give.  At present, patient is receiving zosyn IV q 8h.  He is agreeable to home IV antibiotic therapy and agrees to be responsible for administration and anxious to be educated on administration. Discussed that he needs someone in the home to stay with him initially and patient says he has a friend that will come and stay.  Patient does have a walker and wheelchair.  Spoke with Dr Orland Jarred and he relays that patient will need to discharge home on a wound vac. He does not anticipate discharge until the end of the week.  Patient is to have angioplasty 2/1, then will have to return to OR for additional debridement.  Dr Orland Jarred asks that [facility] be contacted for home wound vac.  Let message for [employer].  [facility] reports agency was informed 1/30 about potential for home IV antibiotics.   Patient is left handed- PICC to be placed today

## 2016-01-19 NOTE — Progress Notes (Signed)
Patient Demographics  Dennis Fitzgerald, is a 60 y.o. male   MRN: [id number]   DOB - 18-Jun-1956  Admit Date - 01/14/2016    Outpatient Primary MD for the patient is Leanna Sato, MD  Consult requested in the Hospital by Houston Siren, MD, On 01/19/2016   With History of -  Past Medical History  Diagnosis Date  . Diabetes mellitus without complication (HCC)   . Lymphedema   . COPD (chronic obstructive pulmonary disease) [facility])       Past Surgical History  Procedure Laterality Date  . Incision and drainage of wound Right 01/16/2016    Procedure: IRRIGATION AND DEBRIDEMENT WOUND;  Surgeon: Recardo Evangelist, DPM;  Location: [facility] ORS;  Service: Podiatry;  Laterality: Right;  . Application of wound vac Right 01/16/2016    Procedure: APPLICATION OF WOUND VAC;  Surgeon: Recardo Evangelist, DPM;  Location: [facility] ORS;  Service: Podiatry;  Laterality: Right;    in for   Chief Complaint  Patient presents with  . Wound Infection     HPI  Dennis Fitzgerald  is a 60 y.o. male, gangrenous necrotic ulcer right foot with bone involvement. Underwent debridement and removal of infected bone and soft tissue on Saturday, January 28. Has been a wound VAC since then. It is scheduled for a Z-plasty tomorrow.   A full 10 point Review of Systems was done, except as stated above, all other Review of Systems were negative.   Social History Social History  Substance Use Topics  . Smoking status: Current Every Day Smoker  . Smokeless tobacco: Not on file  . Alcohol Use: No     Family History History reviewed. No pertinent family history.   Prior to Admission medications   Medication Sig Start Date End Date Taking? Authorizing Provider  albuterol (PROVENTIL HFA;VENTOLIN HFA) 108 (90 Base) MCG/ACT inhaler Inhale 2 puffs into the lungs  every 4 (four) hours as needed for wheezing or shortness of breath.   Yes Historical Provider, MD  aspirin EC 81 MG tablet Take 81 mg by mouth every evening.   Yes Historical Provider, MD  atorvastatin (LIPITOR) 40 MG tablet Take 40 mg by mouth every evening.   Yes Historical Provider, MD  ciprofloxacin (CIPRO) 500 MG tablet Take 1 tablet (500 mg total) by mouth 2 (two) times daily. 01/12/16  Yes Sharman Cheek, MD  clindamycin (CLEOCIN) 150 MG capsule Take 3 capsules (450 mg total) by mouth 3 (three) times daily. 01/12/16  Yes Sharman Cheek, MD  clopidogrel (PLAVIX) 75 MG tablet Take 75 mg by mouth every evening.   Yes Historical Provider, MD  collagenase (SANTYL) ointment Apply 1 application topically 2 (two) times daily.   Yes Historical Provider, MD  furosemide (LASIX) 40 MG tablet Take 40 mg by mouth 2 (two) times daily.   Yes Historical Provider, MD  insulin NPH-regular Human (NOVOLIN 70/30) (70-30) 100 UNIT/ML injection Inject 50-55 Units into the skin 2 (two) times daily. Pt uses 55 units in the morning and 50 at night.   Yes Historical Provider, MD  linagliptin (TRADJENTA) 5 MG TABS tablet Take 5 mg by mouth every evening.    Yes Historical Provider, MD  lisinopril (  PRINIVIL,ZESTRIL) 10 MG tablet Take 10 mg by mouth every evening.   Yes Historical Provider, MD  metFORMIN (GLUCOPHAGE) 1000 MG tablet Take 1,000 mg by mouth 2 (two) times daily.   Yes Historical Provider, MD  potassium chloride (K-DUR,KLOR-CON) 10 MEQ tablet Take 10 mEq by mouth every evening.   Yes Historical Provider, MD  Umeclidinium-Vilanterol (ANORO ELLIPTA) 62.5-25 MCG/INH AEPB Inhale 1 puff into the lungs every evening.    Yes Historical Provider, MD    Anti-infectives    Start     Dose/Rate Route Frequency Ordered Stop   01/16/16 1200  vancomycin (VANCOCIN) 1,250 mg in sodium chloride 0.9 % 250 mL IVPB  Status:  Discontinued     1,250 mg 166.7 mL/hr over 90 Minutes Intravenous Every 8 hours 01/16/16 0430 01/18/16  1401   01/14/16 2000  vancomycin (VANCOCIN) IVPB 1000 mg/200 mL premix  Status:  Discontinued     1,000 mg 200 mL/hr over 60 Minutes Intravenous Every 8 hours 01/14/16 1636 01/16/16 0430   01/14/16 2000  piperacillin-tazobactam (ZOSYN) IVPB 3.375 g     3.375 g 12.5 mL/hr over 240 Minutes Intravenous 3 times per day 01/14/16 1636     01/14/16 1245  piperacillin-tazobactam (ZOSYN) IVPB 3.375 g     3.375 g 12.5 mL/hr over 240 Minutes Intravenous  Once 01/14/16 1241 01/14/16 1524   01/14/16 1245  vancomycin (VANCOCIN) IVPB 1000 mg/200 mL premix     1,000 mg 200 mL/hr over 60 Minutes Intravenous  Once 01/14/16 1244 01/14/16 1440      Scheduled Meds: . atorvastatin  40 mg Oral QPM  . enoxaparin (LOVENOX) injection  40 mg Subcutaneous Q24H  . furosemide  40 mg Oral BID  . insulin aspart  0-20 Units Subcutaneous TID WC  . insulin aspart  0-5 Units Subcutaneous QHS  . insulin aspart protamine- aspart  30 Units Subcutaneous BID WC  . linagliptin  5 mg Oral QPM  . lisinopril  10 mg Oral QPM  . piperacillin-tazobactam (ZOSYN)  IV  3.375 g Intravenous 3 times per day  . potassium chloride  10 mEq Oral QPM  . salmeterol  1 puff Inhalation BID  . tiotropium  18 mcg Inhalation Daily   Continuous Infusions:  PRN Meds:.acetaminophen, albuterol, magic mouthwash, morphine injection, oxyCODONE  No Known Allergies  Physical Exam  Vitals  Blood pressure 136/74, pulse 84, temperature 98.3 F (36.8 C), temperature source Oral, resp. rate 18, height  (1.803 m), weight 113.399 kg (250 lb), SpO2 95 %.  Lower Extremity exam:  Data Review  CBC  Recent Labs Lab 01/14/16 1029 01/15/16 0451 01/16/16 0406 01/19/16 0552  WBC 12.1* 9.3 7.3 4.7  HGB 13.8 13.0 13.0 12.7*  HCT 41.1 38.5* 38.4* 37.8*  PLT 255 232 218 180  MCV 83.0 83.9 82.6 83.9  MCH 27.8 28.4 27.9 28.3  MCHC 33.5 33.8 33.7 33.7  RDW 15.1* 14.9* 14.9* 15.2*  LYMPHSABS  --   --   --  0.3*  MONOABS  --   --   --  0.5   EOSABS  --   --   --  0.3  BASOSABS  --   --   --  0.0   ------------------------------------------------------------------------------------------------------------------  Chemistries   Recent Labs Lab 01/14/16 1029 01/15/16 0451 01/16/16 0406 01/19/16 0552  NA 127* 130* 133*  --   K 4.5 3.4* 3.5  --   CL 90* 96* 99*  --   CO2 --  GLUCOSE 238* 93 97  --   BUN --   CREATININE 0.79 0.68 0.77 0.76  CALCIUM 8.5* 8.2* 8.2*  --    ------------------------------------------------------------------------------------------------------------------ estimated creatinine clearance is 127.3 mL/min (by C-G formula based on Cr of 0.76). ------------------------------------------------------------------------------------------------------------------ No results for input(s): TSH, T4TOTAL, T3FREE, THYROIDAB in the last 72 hours.  Invalid input(s): FREET3     ---------------------------------------------------------------------------------------------------------------   Assessment & Plan: Patient's wound VAC is intact at this point he is comfortable. White count is coming down steadily since surgery on Saturday. Scheduled for angioplasty tomorrow. I will likely taken back to the OR on Thursday for further debridement and reapplication of wound VAC if applicable.  Active Problems:   Diabetic foot ulcer (HCC)  Epimenio Sarin M.D on 01/19/2016 at 5:47 PM  Thank you for the consult, we will follow the patient with you in the Hospital.

## 2016-01-19 NOTE — Progress Notes (Signed)
Pt's blood sugar is still 50 after 4oz of OJ. Lunch tray is at bedside and pt voiced understanding that he needs to eat meal to bring up blood sugar. Will recheck blood sugar.

## 2016-01-20 ENCOUNTER — Encounter
Admission: EM | Disposition: A | Discharge status: Skilled Nursing Facility | Payer: Self-pay | Source: Home / Self Care | Attending: Specialist

## 2016-01-20 HISTORY — PX: PERIPHERAL VASCULAR CATHETERIZATION: SHX172C

## 2016-01-20 LAB — ANAEROBIC CULTURE

## 2016-01-20 LAB — GLUCOSE, CAPILLARY
GLUCOSE-CAPILLARY: 125 mg/dL — AB (ref 65–99)
Glucose-Capillary: 132 mg/dL — ABNORMAL HIGH (ref 65–99)
Glucose-Capillary: 123 mg/dL — ABNORMAL HIGH (ref 65–99)

## 2016-01-20 SURGERY — LOWER EXTREMITY ANGIOGRAPHY
Anesthesia: Moderate Sedation | Laterality: Right

## 2016-01-20 MED ORDER — LIDOCAINE HCL (PF) 1 % IJ SOLN
INTRAMUSCULAR | Status: AC
  Filled 2016-01-20: qty 30

## 2016-01-20 MED ORDER — SODIUM CHLORIDE 0.9 % IV SOLN
INTRAVENOUS | Status: DC
  Administered 2016-01-22: 12:00:00 via INTRAVENOUS

## 2016-01-20 MED ORDER — MIDAZOLAM HCL 2 MG/2ML IJ SOLN
INTRAMUSCULAR | Status: AC
  Filled 2016-01-20: qty 4

## 2016-01-20 MED ORDER — FENTANYL CITRATE (PF) 100 MCG/2ML IJ SOLN
INTRAMUSCULAR | Status: AC
  Filled 2016-01-20: qty 2

## 2016-01-20 MED ORDER — INSULIN ASPART PROT & ASPART (70-30 MIX) 100 UNIT/ML ~~LOC~~ SUSP
20.0000 [IU] | Freq: Every day | SUBCUTANEOUS | Status: DC
  Administered 2016-01-21 – 2016-01-25 (×5): 20 [IU] via SUBCUTANEOUS
  Filled 2016-01-20 (×5): qty 20

## 2016-01-20 MED ORDER — LACTULOSE 10 GM/15ML PO SOLN
30.0000 g | Freq: Two times a day (BID) | ORAL | Status: DC
Start: 2016-01-20 — End: 2016-01-26
  Administered 2016-01-20 – 2016-01-26 (×9): 30 g via ORAL
  Filled 2016-01-20 (×2): qty 60
  Filled 2016-01-20: qty 30
  Filled 2016-01-20 (×10): qty 60

## 2016-01-20 MED ORDER — HEPARIN SODIUM (PORCINE) 1000 UNIT/ML IJ SOLN
INTRAMUSCULAR | Status: AC
  Filled 2016-01-20: qty 1

## 2016-01-20 MED ORDER — MIDAZOLAM HCL 2 MG/2ML IJ SOLN
INTRAMUSCULAR | Status: DC | PRN
  Administered 2016-01-20: 2 mg via INTRAVENOUS
  Administered 2016-01-20: 1 mg via INTRAVENOUS
  Administered 2016-01-20: 2 mg via INTRAVENOUS

## 2016-01-20 MED ORDER — MIDAZOLAM HCL 2 MG/2ML IJ SOLN
INTRAMUSCULAR | Status: AC
  Filled 2016-01-20: qty 2

## 2016-01-20 MED ORDER — HEPARIN SODIUM (PORCINE) 1000 UNIT/ML IJ SOLN
INTRAMUSCULAR | Status: DC | PRN
  Administered 2016-01-20: 5000 [IU] via INTRAVENOUS

## 2016-01-20 MED ORDER — SODIUM CHLORIDE FLUSH 0.9 % IV SOLN
INTRAVENOUS | Status: AC
  Filled 2016-01-20: qty 50

## 2016-01-20 MED ORDER — HEPARIN (PORCINE) IN NACL 2-0.9 UNIT/ML-% IJ SOLN
INTRAMUSCULAR | Status: AC
  Filled 2016-01-20: qty 1000

## 2016-01-20 MED ORDER — LIDOCAINE HCL 1 % IJ SOLN
INTRAMUSCULAR | Status: DC | PRN
  Administered 2016-01-20: 5 mL via INTRADERMAL

## 2016-01-20 MED ORDER — FENTANYL CITRATE (PF) 100 MCG/2ML IJ SOLN
INTRAMUSCULAR | Status: DC | PRN
  Administered 2016-01-20 (×3): 50 ug via INTRAVENOUS

## 2016-01-20 MED ORDER — INSULIN ASPART PROT & ASPART (70-30 MIX) 100 UNIT/ML ~~LOC~~ SUSP
30.0000 [IU] | Freq: Every day | SUBCUTANEOUS | Status: DC
  Administered 2016-01-22 – 2016-01-26 (×5): 30 [IU] via SUBCUTANEOUS
  Filled 2016-01-20 (×5): qty 30

## 2016-01-20 MED ORDER — IOHEXOL 300 MG/ML  SOLN
INTRAMUSCULAR | Status: DC | PRN
Start: 2016-01-20 — End: 2016-01-20
  Administered 2016-01-20: 80 mL via INTRA_ARTERIAL

## 2016-01-20 SURGICAL SUPPLY — 20 items
BALLN LUTONIX 5X120X130 (BALLOONS) ×3
BALLOON LUTONIX 5X120X130 (BALLOONS) ×1 IMPLANT
CANNULA 5F STIFF (CANNULA) ×9 IMPLANT
CATH G STR 4FX120.038 (CATHETERS) ×3 IMPLANT
CATH PIG 70CM (CATHETERS) ×3 IMPLANT
DEVICE CLOSURE MYNXGRIP 6/7F (Vascular Products) ×3 IMPLANT
DEVICE PRESTO INFLATION (MISCELLANEOUS) ×3 IMPLANT
GLIDEWIRE ANGLED SS 035X260CM (WIRE) ×3 IMPLANT
GUIDEWIRE SUPER STIFF .035X180 (WIRE) ×3 IMPLANT
PACK ANGIOGRAPHY (CUSTOM PROCEDURE TRAY) ×3 IMPLANT
SET INTRO CAPELLA COAXIAL (SET/KITS/TRAYS/PACK) ×3 IMPLANT
SHEATH ANL 5FRX45 (SHEATH) ×3 IMPLANT
SHEATH ANL2 6FRX45 HC (SHEATH) ×3 IMPLANT
SHEATH BRITE TIP 5FRX11 (SHEATH) ×3 IMPLANT
SHEATH BRITE TIP 6FRX11 (SHEATH) ×3 IMPLANT
SYR MEDRAD MARK V 150ML (SYRINGE) ×3 IMPLANT
TUBING CONTRAST HIGH PRESS 72 (TUBING) ×3 IMPLANT
WIRE G 018X200 V18 (WIRE) ×3 IMPLANT
WIRE J 3MM .035X145CM (WIRE) ×3 IMPLANT
WIRE MAGIC TORQUE 260C (WIRE) ×3 IMPLANT

## 2016-01-20 NOTE — NC FL2 (Signed)
[place] MEDICAID FL2 LEVEL OF CARE SCREENING TOOL     IDENTIFICATION  Patient Name: Dennis Fitzgerald Birthdate: February 07, 1956 Sex: male Admission Date (Current Location): 01/14/2016  [place] and [place] Number:  [address] [id number] [facility] Facility and Address:  [facility], [address] [place], [place] [address]      Provider Number: [id number]  Attending Physician Name and Address:  Houston Siren, MD  Relative Name and Phone Number:       Current Level of Care: Hospital Recommended Level of Care: Skilled Nursing Facility Prior Approval Number:    Date Approved/Denied:   PASRR Number:    Discharge Plan: SNF    Current Diagnoses: Patient Active Problem List   Diagnosis Date Noted  . Diabetic foot ulcer (HCC) 01/14/2016    Orientation RESPIRATION BLADDER Height & Weight     Self, Time, Situation, Place  Normal Continent Weight: 250 lb (113.399 kg) Height:   (180.3 cm)  BEHAVIORAL SYMPTOMS/MOOD NEUROLOGICAL BOWEL NUTRITION STATUS   (none)  (none) Continent Diet  AMBULATORY STATUS COMMUNICATION OF NEEDS Skin   Extensive Assist Verbally PU Stage and Appropriate Care, Surgical wounds, Wound Vac                       Personal Care Assistance Level of Assistance  Bathing, Dressing Bathing Assistance: Limited assistance   Dressing Assistance: Limited assistance     Functional Limitations Info             SPECIAL CARE FACTORS FREQUENCY  PT (By licensed PT) (Will need IV ABX)                    Contractures Contractures Info: Not present    Additional Factors Info  Code Status, Allergies Code Status Info: Full, nka             Current Medications (01/20/2016):  This is the current hospital active medication list Current Facility-Administered Medications  Medication Dose Route Frequency Provider Last Rate Last Dose  . acetaminophen (TYLENOL) tablet 650 mg  650 mg Oral Q6H PRN Cindi Carbon, RPH      . albuterol  (PROVENTIL) (2.5 MG/3ML) 0.083% nebulizer solution 2.5 mg  2.5 mg Inhalation Q4H PRN Adrian Saran, MD      . atorvastatin (LIPITOR) tablet 40 mg  40 mg Oral QPM Adrian Saran, MD   40 mg at 01/19/16 1731  . enoxaparin (LOVENOX) injection 40 mg  40 mg Subcutaneous Q24H Sital Mody, MD   40 mg at 01/19/16 1731  . furosemide (LASIX) tablet 40 mg  40 mg Oral BID Adrian Saran, MD   Stopped at 01/20/16 0804  . insulin aspart (novoLOG) injection 0-20 Units  0-20 Units Subcutaneous TID WC Adrian Saran, MD   Stopped at 01/20/16 0806  . insulin aspart (novoLOG) injection 0-5 Units  0-5 Units Subcutaneous QHS Adrian Saran, MD   0 Units at 01/14/16 2200  . insulin aspart protamine- aspart (NOVOLOG MIX 70/30) injection 30 Units  30 Units Subcutaneous BID WC Houston Siren, MD   Stopped at 01/20/16 [id number]  . linagliptin (TRADJENTA) tablet 5 mg  5 mg Oral QPM Adrian Saran, MD   5 mg at 01/19/16 1731  . lisinopril (PRINIVIL,ZESTRIL) tablet 10 mg  10 mg Oral QPM Adrian Saran, MD   10 mg at 01/19/16 1731  . magic mouthwash  10 mL Oral TID PRN Wyatt Haste, MD   10 mL at 01/19/16 0851  .  morphine 2 MG/ML injection 2 mg  2 mg Intravenous Q4H PRN Wyatt Haste, MD   2 mg at 01/16/16 1243  . oxyCODONE (Oxy IR/ROXICODONE) immediate release tablet 5-10 mg  5-10 mg Oral Q4H PRN Houston Siren, MD   10 mg at 01/20/16 0609  . piperacillin-tazobactam (ZOSYN) IVPB 3.375 g  3.375 g Intravenous 3 times per day Adrian Saran, MD   3.375 g at 01/20/16 0610  . potassium chloride (K-DUR,KLOR-CON) CR tablet 10 mEq  10 mEq Oral QPM Sital Mody, MD   10 mEq at 01/19/16 1731  . salmeterol (SEREVENT) diskus inhaler 1 puff  1 puff Inhalation BID Adrian Saran, MD   Stopped at 01/20/16 0800  . tiotropium (SPIRIVA) inhalation capsule 18 mcg  18 mcg Inhalation Daily Adrian Saran, MD   Stopped at 01/20/16 0800     Discharge Medications: Please see discharge summary for a list of discharge medications.  Relevant Imaging Results:  Relevant Lab  Results:   Additional Information SS: [id number]  York Spaniel, LCSW

## 2016-01-20 NOTE — Care Management (Signed)
Dr Orland Jarred completed [facility] referral information and relayed to CM that patient informed him last pm "he might need to go to a facility."  Spoke with patient and he confirmed.  Says that "I might not be able to manage all this."  There is concern as to whether patient will receive bed offers due to his payor being medicaid and the need for IV antibiotics and wound vac.  Notified [facility] of change in plan but will hold on to referral in the event the discharge plan changes.  Notified [facility] also

## 2016-01-20 NOTE — Progress Notes (Signed)
Pt clinically stable post procedure, left groin without hematoma nor bleeding , pad device on left groin with 50 ml  Air instilled, to have this on until 1915, thereafter air may begin to be removed- see orders per Dr Gilda Crease under nursing misc. Orders. Vss, sr per monitor, pt awake alert and oriented at this time, Dr Gilda Crease out to speak with pt with questions answered, angioplasty to area now open, report called to care nurse Alecia RN with orders and plan of care reviewed, to review pad with nurse when I take pt back to room,

## 2016-01-20 NOTE — Op Note (Signed)
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 Introduction catheter into right lower extremity 3rd order catheter placement  2. Contrast injection right lower extremity for distal runoff  3. Percutaneous transluminal angioplasty right superficial femoral artery with a 5 x 100 Lutonix balloon  Anesthesia: Conscious sedation was administered under my direct supervision. IV Versed plus fentanyl were utilized. Continuous ECG, pulse oximetry and blood pressure was monitored throughout the entire procedure. Total sedation time was 1 hour  Sheath: 6 [place] Ansell left common femoral artery  Contrast: 80 cc  Fluoroscopy Time: 5.5 minutes  Indications: Maceo Pro presents with increasing pain of the right lower extremity associated with osteomyelitis of the fifth metatarsal.. This suggests the patient is having limb threatening ischemia. The risks and benefits are reviewed all questions answered patient agrees to proceed.  Procedure:Cope Propes is a 60 y.o. y.o. male who was identified and appropriate procedural time out was performed. The patient was then placed supine on the table and prepped and draped in the usual sterile fashion.   Ultrasound was placed in the sterile sleeve and the left groin was evaluated the left common femoral artery was echolucent and pulsatile indicating patency. Image was recorded for the permanent record and under real-time visualization a microneedle was inserted into the common femoral artery microwire followed by a micro-sheath. A J-wire was then advanced through the micro-sheath and a 5 [place] sheath was then inserted over a  J-wire. J-wire was then advanced and a 5 French pigtail catheter was positioned at the level of T12.  AP projection of the aorta was then obtained. Pigtail catheter was repositioned to above the bifurcation and a RAO view of the pelvis was obtained. Subsequently a pigtail catheter with the stiff angle Glidewire was used to cross the aortic bifurcation the catheter wire were advanced down into the right distal external iliac artery. Oblique view of the femoral bifurcation was then obtained and subsequently the wire was reintroduced and the pigtail catheter negotiated into the SFA representing third order catheter placement. Distal runoff was then performed.  5000 units of heparin was then given and allowed to circulate and a 6 [place] Ansell sheath was advanced up and over the bifurcation and positioned in the femoral artery  Straight catheter and stiff angle Glidewire were then negotiated down into the distal popliteal. Catheter was then advanced. Hand injection contrast demonstrated the tibial anatomy in detail.  5 mm x 10 cm Lutonix balloon was used to angioplasty the distal SFA and proximal above-knee popliteal. Each inflation was for 2 minutes at 14 atm. Follow-up imaging demonstrated excellent patency with preservation of the distal runoff.  Distal runoff was then reassessed.   After review of these images the sheath is pulled into the left external iliac oblique of the common femoral is obtained and a minx close device deployed. There no immediate Complications.  Findings: The abdominal aorta is opacified with a bolus injection contrast. Renal arteries are patent bilaterally. The aorta itself has diffuse disease but no hemodynamically significant lesions. The common and external iliac arteries are widely patent bilaterally.  The right common femoral is widely patent as is the profunda femoris.  The SFA does indeed have a significant stenosis at Hunter's canal which is greater than 90% associated  with moderate to severe diffuse  disease just above and below this area.  The distal popliteal demonstrates patency and the trifurcation is patent with two-vessel runoff to the foot crossing the ankle, the anterior tibial and the peroneal  Following angioplasty the SFA is widely patent with less than 5% residual stenosis.   Summary: Successful recanalization right lower cavity for limb salvage   Disposition: Patient was taken to the recovery room in stable condition having tolerated the procedure well.  Dennis Fitzgerald, Dennis Fitzgerald 01/20/2016,5:23 PM

## 2016-01-20 NOTE — Progress Notes (Signed)
Inpatient Diabetes Program Recommendations  AACE/ADA: New Consensus Statement on Inpatient Glycemic Control (2015)  Target Ranges:  Prepandial:   less than 140 mg/dL      Peak postprandial:   less than 180 mg/dL (1-2 hours)      Critically ill patients:  140 - 180 mg/dL  Results for [name], [name] (MRN [id number]) as of 01/20/2016 08:06  Ref. Range 01/19/2016 07:46 01/19/2016 11:31 01/19/2016 11:54 01/19/2016 12:40 01/19/2016 13:20 01/19/2016 16:53 01/19/2016 21:25 01/19/2016 22:17 01/19/2016 23:14 01/20/2016 07:45  Glucose-Capillary Latest Ref Range: 65-99 mg/dL 96 50 (L) 50 (L) 58 (L) 77 115 (H) 53 (L) 60 (L) 71 132 (H)   Review of Glycemic Control  Current orders for Inpatient glycemic control: 70/30 30 units BID, Novolog 0-20 units TID with meals, Novolog 0-5 units HS, Tradjenta 5 mg QPM  Inpatient Diabetes Program Recommendations: Insulin - Basal: Patient received 70/30 45 units at 8:45 am on 01/19/16 and glucose was 50 mg/dl at 78:29 am on 5/62/13. Therefore, 70/30 dosage was decreased to 30 units BID. Patient received 70/30 30 units at 17:31 on 01/19/16 and glucose was 53 mg/dl at 08:65. According to the chart, patient ate 100% of supper. Please consider decreasing evening dose of 70/30 to 20 units QPM wiht supper. Patient is NPO and should NOT receive 70/30 this morning since he will not be eating breakfast.  Thanks, Orlando Penner, RN, MSN, CDE Diabetes Coordinator Inpatient Diabetes Program [phone] (Team Pager from 8am to 5pm) [phone] (AP office) [phone] [facility] office) [phone] [facility] office)

## 2016-01-20 NOTE — Progress Notes (Addendum)
Pt CBG is 53, pt is alert. RN gave pt 2  4 oz. cups of orange juice and one pack of sugar in his orange juice. RN recheck at 2217, CBG 60. Dr Anne Hahn was notified and new orders to start pt on 5% of dextrose- 0.45% sodium chloride continuous at a rate of 75 ml/hr. Will continue to monitor pt.  Karsten Ro

## 2016-01-20 NOTE — Progress Notes (Signed)
[facility] - [place] at [facility]   PATIENT NAME: Dennis Fitzgerald    MR#:  [id number]  DATE OF BIRTH:  07-Jul-1956  SUBJECTIVE:    Pt. Here due to a right foot diabetic foot ulcer and noted to have osteomyelitis of the right fifth metatarsal. Patient is status post removal of the infected fifth metatarsal proximal half and debridement of the underlying soft tissue infection POD # 4. Cont. Wound vac.  Was hypoglycemic this a.m. Again.    REVIEW OF SYSTEMS:    Review of Systems  Constitutional: Negative for fever and chills.  HENT: Negative for congestion and tinnitus.   Eyes: Negative for blurred vision and double vision.  Respiratory: Negative for cough, shortness of breath and wheezing.   Cardiovascular: Negative for chest pain, orthopnea and PND.  Gastrointestinal: Negative for nausea, vomiting, abdominal pain and diarrhea.  Genitourinary: Negative for dysuria and hematuria.  Neurological: Negative for dizziness, sensory change and focal weakness.  All other systems reviewed and are negative.   Nutrition: Carb modified.  Tolerating Diet: yes Tolerating PT: Await Eval.   DRUG ALLERGIES:  No Known Allergies  VITALS:  Blood pressure 134/81, pulse 70, temperature 98.8 F (37.1 C), temperature source Oral, resp. rate 20, height  (1.803 m), weight 113.399 kg (250 lb), SpO2 100 %.  PHYSICAL EXAMINATION:   Physical Exam  GENERAL:  60 y.o.-year-old obese patient lying in the bed in no acute distress.  EYES: Pupils equal, round, reactive to light and accommodation. No scleral icterus. Extraocular muscles intact.  HEENT: Head atraumatic, normocephalic. Oropharynx and nasopharynx clear.  NECK:  Supple, no jugular venous distention. No thyroid enlargement, no tenderness.  LUNGS: Normal breath sounds bilaterally, no wheezing, rales, rhonchi. No use of accessory muscles of respiration.  CARDIOVASCULAR: S1, S2 normal. No murmurs, rubs, or gallops.   ABDOMEN: Soft, nontender, nondistended. Bowel sounds present. No organomegaly or mass.  EXTREMITIES: No cyanosis, clubbing, chronic lymphedema b/l.  Right Lower ext. wound dressing w/ wound vac in place. No acute drainage noted.     NEUROLOGIC: Cranial nerves II through XII are intact. No focal Motor or sensory deficits b/l.   PSYCHIATRIC: The patient is alert and oriented x 3. Good affect.    SKIN: No obvious rash, lesion, or ulcer.    LABORATORY PANEL:   CBC  Recent Labs Lab 01/19/16 0552  WBC 4.7  HGB 12.7*  HCT 37.8*  PLT 180   ------------------------------------------------------------------------------------------------------------------  Chemistries   Recent Labs Lab 01/16/16 0406 01/19/16 0552  NA 133*  --   K 3.5  --   CL 99*  --   CO2 29  --   GLUCOSE 97  --   BUN 6  --   CREATININE 0.77 0.76  CALCIUM 8.2*  --    ------------------------------------------------------------------------------------------------------------------  Cardiac Enzymes No results for input(s): TROPONINI in the last 168 hours. ------------------------------------------------------------------------------------------------------------------  RADIOLOGY:  Dg Chest Port 1 View  01/19/2016  CLINICAL DATA:  PICC placement.  COPD. EXAM: PORTABLE CHEST 1 VIEW COMPARISON:  CT 02/11/2014 FINDINGS: Tip of the right upper extremity PICC in the mid-distal SVC. No pneumothorax. Chronic elevation of right hemidiaphragm. Mild increase in atelectasis at the right lung base. Cardiomediastinal contours are unchanged. Questionable vascular congestion versus technique. No pleural effusion. Questionable focus of air in the soft tissue of the right axilla. IMPRESSION: 1. Tip of the right upper extremity PICC in the mid distal SVC. 2. Chronic elevation of right hemidiaphragm with mild increase in right  basilar atelectasis. 3. Questionable vascular congestion. 4. Probable small focus of air in the soft tissues  in the right axilla. This may be procedure related related, however recommend correlation to exclude soft tissue infection. Electronically Signed   By: Rubye Oaks M.D.   On: 01/19/2016 21:27     ASSESSMENT AND PLAN:   60 year old male with past history of diabetes type 2, chronic lymphedema, COPD, hyperlipidemia, hypertension who presented to the hospital due to right lower extremity diabetic foot ulcer.  #1 right lower extremity diabetic foot ulcer/Osteomyelitis-appreciate podiatry help and patient is status post debridement and partial removal of the infected fifth metatarsal on the right foot POD # 4. -Continue IV Zosyn. Wound cultures growing Enterobacter presently. -Appreciate infectious disease input and patient will likely need 4-6 weeks of antibiotics. To get PICC line today.  -seen by vascular surgery and plan for Lower ext. Angiogram today. - also going back to OR as per Podiatry for further debridement tomorrow.   #2 type 2 diabetes without complication-but hypoglycemic again this morning. I will lower his evening dose of insulin. Continue Tradjenta. Follow blood sugars.  #3 hypertension-continue lisinopril  #4 COPD-without acute exacerbation. Continue Spiriva, Serevent.  #5 hyperlipidemia-continue atorvastatin.  Patient likely will need short-term rehabilitation and case management made aware and they have started bed search.  All the records are reviewed and case discussed with Care Management/Social Workerr. Management plans discussed with the patient, family and they are in agreement.  CODE STATUS: Full  DVT Prophylaxis: Lovenox  TOTAL TIME TAKING CARE OF THIS PATIENT: 25 minutes.   POSSIBLE D/C IN 2-3 DAYS, DEPENDING ON CLINICAL CONDITION.   Houston Siren M.D on 01/20/2016 at 3:18 PM  Between 7am to 6pm - Pager - [phone]  After 6pm go to [email or web address] - password [email or web address] [facility]  [facility]  Office  [phone]  CC: Primary care  physician; Leanna Sato, MD

## 2016-01-20 NOTE — Progress Notes (Signed)
Patient Demographics  Dennis Fitzgerald, is a 60 y.o. male   MRN: [id number]   DOB - [birth year]  Admit Date - 01/14/2016    Outpatient Primary MD for the patient is Leanna Sato, MD  Consult requested in the Hospital by Houston Siren, MD, On 01/20/2016       With History of -  Past Medical History  Diagnosis Date  . Diabetes mellitus without complication (HCC)   . Lymphedema   . COPD (chronic obstructive pulmonary disease) [facility])       Past Surgical History  Procedure Laterality Date  . Incision and drainage of wound Right 01/16/2016    Procedure: IRRIGATION AND DEBRIDEMENT WOUND;  Surgeon: Recardo Evangelist, DPM;  Location: [facility] ORS;  Service: Podiatry;  Laterality: Right;  . Application of wound vac Right 01/16/2016    Procedure: APPLICATION OF WOUND VAC;  Surgeon: Recardo Evangelist, DPM;  Location: [facility] ORS;  Service: Podiatry;  Laterality: Right;    in for   Chief Complaint  Patient presents with  . Wound Infection     HPI  Dennis Fitzgerald  is a 60 y.o. male, patient has a diabetic ulcer on his right foot secondary to wet gangrene on that area hadn't involvement with the fifth metatarsal bone. He underwent surgery on Saturday, January 28 to resect infected bone and soft tissue from the region and a wound VAC was placed on the area. Underwent a angioplasty today with improved flow and lower extremity. Has had wound VAC on but needs further debridement to remove devitalized necrotic tissue from the wound.    Review of Systems    In addition to the HPI above,  No Fever-chills, No Headache, No changes with Vision or hearing, No problems swallowing food or Liquids, No Chest pain, Cough or Shortness of Breath, No Abdominal pain, No Nausea or Vommitting, Bowel movements are regular, No Blood in stool or  Urine, No dysuria, No new skin rashes or bruises, No new joints pains-aches,  No new weakness, tingling, numbness in any extremity, No recent weight gain or loss, No polyuria, polydypsia or polyphagia, No significant Mental Stressors.  A full 10 point Review of Systems was done, except as stated above, all other Review of Systems were negative.   Social History Social History  Substance Use Topics  . Smoking status: Current Every Day Smoker  . Smokeless tobacco: Not on file  . Alcohol Use: No     Family History History reviewed. No pertinent family history.   Prior to Admission medications   Medication Sig Start Date End Date Taking? Authorizing Provider  albuterol (PROVENTIL HFA;VENTOLIN HFA) 108 (90 Base) MCG/ACT inhaler Inhale 2 puffs into the lungs every 4 (four) hours as needed for wheezing or shortness of breath.   Yes Historical Provider, MD  aspirin EC 81 MG tablet Take 81 mg by mouth every evening.   Yes Historical Provider, MD  atorvastatin (LIPITOR) 40 MG tablet Take 40 mg by mouth every evening.   Yes Historical Provider, MD  ciprofloxacin (CIPRO) 500 MG tablet Take 1 tablet (500 mg total) by mouth 2 (two) times daily. 01/12/16  Yes Sharman Cheek, MD  clindamycin (CLEOCIN) 150 MG capsule  Take 3 capsules (450 mg total) by mouth 3 (three) times daily. 01/12/16  Yes Sharman Cheek, MD  clopidogrel (PLAVIX) 75 MG tablet Take 75 mg by mouth every evening.   Yes Historical Provider, MD  collagenase (SANTYL) ointment Apply 1 application topically 2 (two) times daily.   Yes Historical Provider, MD  furosemide (LASIX) 40 MG tablet Take 40 mg by mouth 2 (two) times daily.   Yes Historical Provider, MD  insulin NPH-regular Human (NOVOLIN 70/30) (70-30) 100 UNIT/ML injection Inject 50-55 Units into the skin 2 (two) times daily. Pt uses 55 units in the morning and 50 at night.   Yes Historical Provider, MD  linagliptin (TRADJENTA) 5 MG TABS tablet Take 5 mg by mouth every  evening.    Yes Historical Provider, MD  lisinopril (PRINIVIL,ZESTRIL) 10 MG tablet Take 10 mg by mouth every evening.   Yes Historical Provider, MD  metFORMIN (GLUCOPHAGE) 1000 MG tablet Take 1,000 mg by mouth 2 (two) times daily.   Yes Historical Provider, MD  potassium chloride (K-DUR,KLOR-CON) 10 MEQ tablet Take 10 mEq by mouth every evening.   Yes Historical Provider, MD  Umeclidinium-Vilanterol (ANORO ELLIPTA) 62.5-25 MCG/INH AEPB Inhale 1 puff into the lungs every evening.    Yes Historical Provider, MD    Anti-infectives    Start     Dose/Rate Route Frequency Ordered Stop   01/16/16 1200  vancomycin (VANCOCIN) 1,250 mg in sodium chloride 0.9 % 250 mL IVPB  Status:  Discontinued     1,250 mg 166.7 mL/hr over 90 Minutes Intravenous Every 8 hours 01/16/16 0430 01/18/16 1401   01/14/16 2000  vancomycin (VANCOCIN) IVPB 1000 mg/200 mL premix  Status:  Discontinued     1,000 mg 200 mL/hr over 60 Minutes Intravenous Every 8 hours 01/14/16 1636 01/16/16 0430   01/14/16 2000  piperacillin-tazobactam (ZOSYN) IVPB 3.375 g     3.375 g 12.5 mL/hr over 240 Minutes Intravenous 3 times per day 01/14/16 1636     01/14/16 1245  piperacillin-tazobactam (ZOSYN) IVPB 3.375 g     3.375 g 12.5 mL/hr over 240 Minutes Intravenous  Once 01/14/16 1241 01/14/16 1524   01/14/16 1245  vancomycin (VANCOCIN) IVPB 1000 mg/200 mL premix     1,000 mg 200 mL/hr over 60 Minutes Intravenous  Once 01/14/16 1244 01/14/16 1440      Scheduled Meds: . atorvastatin  40 mg Oral QPM  . enoxaparin (LOVENOX) injection  40 mg Subcutaneous Q24H  . furosemide  40 mg Oral BID  . insulin aspart  0-20 Units Subcutaneous TID WC  . insulin aspart  0-5 Units Subcutaneous QHS  . insulin aspart protamine- aspart  20 Units Subcutaneous Q supper  . [START ON 01/21/2016] insulin aspart protamine- aspart  30 Units Subcutaneous Q breakfast  . lactulose  30 g Oral BID  . linagliptin  5 mg Oral QPM  . lisinopril  10 mg Oral QPM  .  piperacillin-tazobactam (ZOSYN)  IV  3.375 g Intravenous 3 times per day  . potassium chloride  10 mEq Oral QPM  . salmeterol  1 puff Inhalation BID  . tiotropium  18 mcg Inhalation Daily   Continuous Infusions: . sodium chloride     PRN Meds:.acetaminophen, albuterol, magic mouthwash, morphine injection, oxyCODONE  No Known Allergies  Physical Exam  Vitals  Blood pressure 145/81, pulse 63, temperature 98.8 F (37.1 C), temperature source Oral, resp. rate 17, height  (1.803 m), weight 113.399 kg (250 lb), SpO2 91 %.  Lower Extremity  exam: Wound VAC removed yesterday and some necrotic tissue still noted in the region. No overt infection or cellulitis was noted  CBC  Recent Labs Lab 01/14/16 1029 01/15/16 0451 01/16/16 0406 01/19/16 0552  WBC 12.1* 9.3 7.3 4.7  HGB 13.8 13.0 13.0 12.7*  HCT 41.1 38.5* 38.4* 37.8*  PLT 255 232 218 180  MCV 83.0 83.9 82.6 83.9  MCH 27.8 28.4 27.9 28.3  MCHC 33.5 33.8 33.7 33.7  RDW 15.1* 14.9* 14.9* 15.2*  LYMPHSABS  --   --   --  0.3*  MONOABS  --   --   --  0.5  EOSABS  --   --   --  0.3  BASOSABS  --   --   --  0.0   ------------------------------------------------------------------------------------------------------------------  Chemistries   Recent Labs Lab 01/14/16 1029 01/15/16 0451 01/16/16 0406 01/19/16 0552  NA 127* 130* 133*  --   K 4.5 3.4* 3.5  --   CL 90* 96* 99*  --   CO2 26 27 29   --   GLUCOSE 238* 93 97  --   BUN 9 8 6   --   CREATININE 0.79 0.68 0.77 0.76  CALCIUM 8.5* 8.2* 8.2*  --    ------------------------------------------------------------------------------------------------------------------ estimated creatinine clearance is 127.3 mL/min (by C-G formula based on Cr of 0.76). ------------------------------------------------------------------------------------------------------------------ No results for input(s): TSH, T4TOTAL, T3FREE, THYROIDAB in the last 72 hours.  Invalid input(s):  FREET3  --------------------------------------------------------------------------------------------------------------- Imaging results:   Dg Chest Port 1 View  01/19/2016  CLINICAL DATA:  PICC placement.  COPD. EXAM: PORTABLE CHEST 1 VIEW COMPARISON:  CT 02/11/2014 FINDINGS: Tip of the right upper extremity PICC in the mid-distal SVC. No pneumothorax. Chronic elevation of right hemidiaphragm. Mild increase in atelectasis at the right lung base. Cardiomediastinal contours are unchanged. Questionable vascular congestion versus technique. No pleural effusion. Questionable focus of air in the soft tissue of the right axilla. IMPRESSION: 1. Tip of the right upper extremity PICC in the mid distal SVC. 2. Chronic elevation of right hemidiaphragm with mild increase in right basilar atelectasis. 3. Questionable vascular congestion. 4. Probable small focus of air in the soft tissues in the right axilla. This may be procedure related related, however recommend correlation to exclude soft tissue infection. Electronically Signed   By: Rubye Oaks M.D.   On: 01/19/2016 21:27     Assessment & Plan: Grade for diabetic foot ulcer with osteomyelitis status post resection partial fifth metatarsal and debridement of infected tissue with some residual necrotic infected tissue still in the wound. Angioplasty today with some improvement to lower extremity vascular flow. Plan: We'll take to the OR tomorrow to further debride this area and reapply the wound VAC. Whenever consent form and the patient understands and accepts risk and possible complications. We will get this accomplished tomorrow.  Active Problems:   Diabetic foot ulcer (HCC)     Family Communication: Plan discussed with patient     Epimenio Sarin M.D on 01/20/2016 at 5:46 PM  Thank you for the consult, we will follow the patient with you in the Hospital.

## 2016-01-20 NOTE — Progress Notes (Signed)
[facility] INFECTIOUS DISEASE PROGRESS NOTE Date of Admission:  01/14/2016     ID: Dennis Fitzgerald is a 60 y.o. male with diabetic foot infection and osteomyelitis  Active Problems:   Diabetic foot ulcer ([place])   Subjective:  Denies pain. No fevers,  ROS  Eleven systems are reviewed and negative except per hpi  Medications:  Antibiotics Given (last 72 hours)    Date/Time Action Medication Dose Rate   01/17/16 2009 Given   vancomycin (VANCOCIN) 1,250 mg in sodium chloride 0.9 % 250 mL IVPB 1,250 mg 166.7 mL/hr   01/17/16 2251 Given   piperacillin-tazobactam (ZOSYN) IVPB 3.375 g 3.375 g 12.5 mL/hr   01/18/16 0419 Given   vancomycin (VANCOCIN) 1,250 mg in sodium chloride 0.9 % 250 mL IVPB 1,250 mg 166.7 mL/hr   01/18/16 8757 Given   piperacillin-tazobactam (ZOSYN) IVPB 3.375 g 3.375 g 12.5 mL/hr   01/18/16 1237 Given   piperacillin-tazobactam (ZOSYN) IVPB 3.375 g 3.375 g 12.5 mL/hr   01/18/16 1237 Given   vancomycin (VANCOCIN) 1,250 mg in sodium chloride 0.9 % 250 mL IVPB 1,250 mg 166.7 mL/hr   01/18/16 2109 Given   piperacillin-tazobactam (ZOSYN) IVPB 3.375 g 3.375 g 12.5 mL/hr   01/19/16 0615 Given   piperacillin-tazobactam (ZOSYN) IVPB 3.375 g 3.375 g 12.5 mL/hr   01/19/16 1508 Given   piperacillin-tazobactam (ZOSYN) IVPB 3.375 g 3.375 g 12.5 mL/hr   01/19/16 2210 Given   piperacillin-tazobactam (ZOSYN) IVPB 3.375 g 3.375 g 12.5 mL/hr   01/20/16 0610 Given   piperacillin-tazobactam (ZOSYN) IVPB 3.375 g 3.375 g 12.5 mL/hr   01/20/16 1350 Given   piperacillin-tazobactam (ZOSYN) IVPB 3.375 g 3.375 g 12.5 mL/hr     . atorvastatin  40 mg Oral QPM  . enoxaparin (LOVENOX) injection  40 mg Subcutaneous Q24H  . furosemide  40 mg Oral BID  . insulin aspart  0-20 Units Subcutaneous TID WC  . insulin aspart  0-5 Units Subcutaneous QHS  . insulin aspart protamine- aspart  30 Units Subcutaneous BID WC  . linagliptin  5 mg Oral QPM  . lisinopril  10 mg Oral QPM  .  piperacillin-tazobactam (ZOSYN)  IV  3.375 g Intravenous 3 times per day  . potassium chloride  10 mEq Oral QPM  . salmeterol  1 puff Inhalation BID  . tiotropium  18 mcg Inhalation Daily    Objective: Vital signs in last 24 hours: Temp:  [98.3 F (36.8 C)-99.4 F (37.4 C)] 98.8 F (37.1 C) (02/01 1417) Pulse Rate:  [70-82] 70 (02/01 1417) Resp:  [15-23] 20 (02/01 1417) BP: (134-147)/(68-117) 134/81 mmHg (02/01 1417) SpO2:  [95 %-100 %] 100 % (02/01 1417) Weight:  [113.399 kg (250 lb)] 113.399 kg (250 lb) (02/01 0853) Constitutional: dishelved, obese No distress.  HENT: anicteric  Mouth/Throat: Oropharynx is clear and moist. No oropharyngeal exudate.  Cardiovascular: Normal rate, regular rhythm  Pulmonary/Chest: Effort normal and breath sounds normal. No respiratory distress. He has no wheezes.  Abdominal: Soft. Bowel sounds are normal. He exhibits no distension. There is no tenderness.  Lymphadenopathy: He has no cervical adenopathy.  Neurological: He is alert and oriented to person, place, and time.  Ext impressive bilateral LE lymphedema Skin: RLE wrapped post op Psychiatric: He has a normal mood and affect. His behavior is normal.   Lab Results  Recent Labs  01/19/16 0552  WBC 4.7  HGB 12.7*  HCT 37.8*  CREATININE 0.76    Microbiology: Results for orders placed or performed during the hospital encounter  of 01/14/16  Wound culture     Status: None   Collection Time: 01/14/16  7:16 PM  Result Value Ref Range Status   Specimen Description FOOT  Final   Special Requests NONE  Final   Gram Stain   Final    FEW WBC SEEN RARE GRAM POSITIVE COCCI IN PAIRS FEW GRAM VARIABLE ROD    Culture LIGHT GROWTH ENTEROBACTER CLOACAE  Final   Report Status 01/18/2016 FINAL  Final   Organism ID, Bacteria ENTEROBACTER CLOACAE  Final      Susceptibility   Enterobacter cloacae - MIC*    PIP/TAZO Value in next row Sensitive      SENSITIVE<=4    CEFTAZIDIME Value in next  row Sensitive      SENSITIVE<=1    CEFTRIAXONE Value in next row Sensitive      SENSITIVE<=1    IMIPENEM Value in next row Sensitive      SENSITIVE<=0.25    GENTAMICIN Value in next row Sensitive      SENSITIVE<=1    CIPROFLOXACIN Value in next row Sensitive      SENSITIVE<=0.25    TRIMETH/SULFA Value in next row Sensitive      SENSITIVE<=20    CEFEPIME Value in next row Sensitive      SENSITIVE<=1    * LIGHT GROWTH ENTEROBACTER CLOACAE  MRSA PCR Screening     Status: None   Collection Time: 01/15/16 11:40 PM  Result Value Ref Range Status   MRSA by PCR NEGATIVE NEGATIVE Final    Comment:        The GeneXpert MRSA Assay (FDA approved for NASAL specimens only), is one component of a comprehensive MRSA colonization surveillance program. It is not intended to diagnose MRSA infection nor to guide or monitor treatment for MRSA infections.   Anaerobic culture     Status: None   Collection Time: 01/16/16 10:22 AM  Result Value Ref Range Status   Specimen Description WOUND  Final   Special Requests NONE  Final   Culture NO ANAEROBES ISOLATED  Final   Report Status 01/20/2016 FINAL  Final  Wound culture     Status: None   Collection Time: 01/16/16 10:22 AM  Result Value Ref Range Status   Specimen Description WOUND  Final   Special Requests NONE  Final   Gram Stain RARE WBC SEEN NO ORGANISMS SEEN   Final   Culture LIGHT GROWTH ENTEROBACTER CLOACAE  Final   Report Status 01/19/2016 FINAL  Final   Organism ID, Bacteria ENTEROBACTER CLOACAE  Final      Susceptibility   Enterobacter cloacae - MIC*    PIP/TAZO Value in next row Sensitive      SENSITIVE<=4    CEFTAZIDIME Value in next row Sensitive      SENSITIVE<=1    CEFTRIAXONE Value in next row Sensitive      SENSITIVE<=1    CEFEPIME Value in next row Sensitive      SENSITIVE<=1    IMIPENEM Value in next row Sensitive      SENSITIVE<=0.25    GENTAMICIN Value in next row Sensitive      SENSITIVE<=1    CIPROFLOXACIN  Value in next row Sensitive      SENSITIVE<=0.25    TRIMETH/SULFA Value in next row Sensitive      SENSITIVE<=20    * LIGHT GROWTH ENTEROBACTER CLOACAE    Studies/Results: Mr Foot Right W Wo Contrast  01/16/2016  CLINICAL DATA:  Foot ulcer near the heel, starting  in December 2016, worsening recently. Diabetes. EXAM: MRI OF THE RIGHT HINDFOOT WITHOUT AND WITH CONTRAST TECHNIQUE: Multiplanar, multisequence MR imaging was performed both before and after administration of intravenous contrast. CONTRAST:  7m MULTIHANCE GADOBENATE DIMEGLUMINE 529 MG/ML IV SOLN COMPARISON:  01/14/2016 radiograph FINDINGS: Diffuse subcutaneous edema in the distal calf and ankle noted. There is an ulceration adjacent to the base of fifth metatarsal. Underlying this ulceration there is a 7.6 by 3.9 by 2.0 cm volume of hypoenhancing and likely necrotic tissue. This extends down to the base of the fifth metatarsal which demonstrates diffuse abnormal edema and enhancement compatible with osteomyelitis of the fifth metatarsal. Edema and enhancement is most severe at the metatarsal base but extends in the metatarsal shaft towards the metatarsal head. The peroneus brevis appears to still inserts on the edematous base of the fifth metatarsal in the peroneus longus skirts the margin of the necrotic tissue. There is some tissue necrosis and abnormal edema and enhancement in the abductor digiti minimi muscle as it passes below the fifth metatarsal. The area of soft tissue necrosis abuts the cuboid and there is some faintly increased T2 signal and enhancement in this adjacent portion of the cuboid near the peroneus longus tendon which could reflect an early osteomyelitis for example images 11 through 14 of series 15. There is also abnormal edema and enhancement in the bony articular confluence of the navicular, middle and medial cuneiform, and cuboid as shown on images 16 through 23 of series 11. Of these the most severely involved is the  navicular. I am uncertain whether this edema and enhancement is due to early osteomyelitis or underlying arthropathy. Lisfranc ligament intact. There is peroneus longus and peroneus brevis tenosynovitis. There is 5 mm non-fragmented osteochondral lesion of the lateral talar dome on image 22 series 14. Cellulitis noted in the foot especially severe in the vicinity of the ulceration. Please note that the region of the metatarsal heads and forefoot was not included on today's exam. IMPRESSION: 1. 7.6 by 3.9 by 2.0 cm volume of hypoenhancing necrotic tissue underlying the ulceration near the base of the fifth metatarsal osteomyelitis of the base of the fifth metatarsal extending to the margin of the fifth metatarsal. Diffuse osteomyelitis in the fifth metatarsal especially severe at its base. Possible early osteomyelitis involving the inferior-lateral cuboid, and also in the navicular. Questionable early osteomyelitis in the middle and medial cuneiforms. 2. Diffuse infiltrative edema in the calf and ankle compatible with cellulitis. 3. Incidental 5 mm non-fragmented osteochondral lesion of the lateral talar dome. Electronically Signed   By: WVan ClinesM.D.   On: 01/16/2016 08:14   Dg Chest Port 1 View  01/19/2016  CLINICAL DATA:  PICC placement.  COPD. EXAM: PORTABLE CHEST 1 VIEW COMPARISON:  CT 02/11/2014 FINDINGS: Tip of the right upper extremity PICC in the mid-distal SVC. No pneumothorax. Chronic elevation of right hemidiaphragm. Mild increase in atelectasis at the right lung base. Cardiomediastinal contours are unchanged. Questionable vascular congestion versus technique. No pleural effusion. Questionable focus of air in the soft tissue of the right axilla. IMPRESSION: 1. Tip of the right upper extremity PICC in the mid distal SVC. 2. Chronic elevation of right hemidiaphragm with mild increase in right basilar atelectasis. 3. Questionable vascular congestion. 4. Probable small focus of air in the soft  tissues in the right axilla. This may be procedure related related, however recommend correlation to exclude soft tissue infection. Electronically Signed   By: MJeb LeveringM.D.   On: 01/19/2016  21:27   Dg Foot Complete Right  01/14/2016  CLINICAL DATA:  History of poorly controlled diabetes, chronic lymphedema, dorsal foot ulcer for 7 weeks. Currently on antibiotics but with persistent symptoms. EXAM: RIGHT FOOT COMPLETE - 3+ VIEW COMPARISON:  Right foot plain film dated 01/12/2016 and right foot plain film dated 12/28/2015. FINDINGS: Again noted is an apparent soft tissue defect overlying the right fifth metatarsal base. Compared to earlier plain film from 12/28/2015, there appear to be developing demineralization within the fifth metatarsal base and developing defects along the lateral cortex margin of the fifth metatarsal base, highly suspicious for developing osteomyelitis. Remainder of the osseous structures of the right foot appear intact and normal in mineralization. No soft tissue gas seen. IMPRESSION: 1. Presumed soft tissue defect/ulceration overlying the base of the right fifth metatarsal base. 2. Probable developing demineralization and cortex discontinuity along the lateral margin of the underlying fifth metatarsal base, highly suspicious for developing osteomyelitis. Would consider MRI for further characterization. These results were called by telephone at the time of interpretation on 01/14/2016 at 1:34 pm to Dr. Charlotte Crumb , who verbally acknowledged these results. Electronically Signed   By: Franki Cabot M.D.   On: 01/14/2016 13:36   Dg Foot Complete Right  01/12/2016  CLINICAL DATA:  Right foot pain and wound. EXAM: RIGHT FOOT COMPLETE - 3+ VIEW COMPARISON:  12/28/2015 FINDINGS: Soft tissue swelling in the forefoot especially distally. No bony destructive findings characteristic of osteomyelitis are identified. No obvious gas tracking in the soft tissues. Suspected ulceration adjacent  to the base of the fifth metatarsal. Dorsal spurring of the talar head. IMPRESSION: 1. Continued soft tissue swelling in the forefoot. I do not see definite gas tracking in the soft tissues and no bony destructive findings characteristic of osteomyelitis are identified. 2. Suspected ulceration adjacent to the base the fifth metatarsal. 3. If further imaging workup for osteomyelitis or abscess is identified, consider MRI. Electronically Signed   By: Van Clines M.D.   On: 01/12/2016 13:14   Dg Foot Complete Right  12/28/2015  CLINICAL DATA:  Nonhealing wound.  Diabetes. EXAM: RIGHT FOOT COMPLETE - 3+ VIEW COMPARISON:  None. FINDINGS: Soft tissue wound noted along the lateral aspect of the foot. No underlying foreign body . No acute bony or joint abnormality identified. No evidence of fracture or dislocation . IMPRESSION: Soft tissue wound noted along the lateral aspect of foot. No radiopaque foreign body. No acute bony abnormality. Diffuse degenerative change. Electronically Signed   By: Marcello Moores  Register   On: 12/28/2015 12:35    Assessment/Plan: Dennis Fitzgerald is a 60 y.o. male DM, chronic lymphedema, tobacco abuse, PAD s/p bypass on L admitted with worsening of R LE foot wound. HAd been following with wound center. At admit had wbc 13.6, wound cx with enterobacter (grm + cocci also noted on GS) .  MRI showed osteomyelitis 5th Metatarsal and also possible cuboid and navicular. ESR 40, CRP 9.6 1/28 underwent I and D and removal of prox 5th MT - abx beads placed and wound vac placed.  Bone cx . MRSA screen is negative Getting  angiogram today   Recommendations Cont zosyn for now. Given concern for underlying remaining osteomyelitis will need IV abx for likely 4-6 weeks. Would likely continue zosyn for broad coverage including anaerobes given extent of initial wound and foul odor with it.   Cannot use ceftriaxone in Enterobacter infection due to inducible resistance.   Zosyn will likely be  difficult for him to  give if he goes home and will be expensive. WIll consider other options  Thank you very much for the consult. Will follow with you.  [place], [place]   01/20/2016, 2:30 PM

## 2016-01-20 NOTE — Progress Notes (Signed)
Patient taken down for procedure.  Inhalers will be given when he returns.

## 2016-01-20 NOTE — Progress Notes (Signed)
Initial Nutrition Assessment  DOCUMENTATION CODES:      INTERVENTION:  Meals and snacks: recommend carb modified diet once able to take po   NUTRITION DIAGNOSIS:    (none at this time) related to   as evidenced by  .    GOAL:   Patient will meet greater than or equal to 90% of their needs    MONITOR:    (Energy intake)  REASON FOR ASSESSMENT:   LOS    ASSESSMENT:      Pt s/p right diabetic foot infection with wound vac placement. Planning angiography today.  Not currently in room during rounds this am  Past Medical History  Diagnosis Date  . Diabetes mellitus without complication ([facility])   . Lymphedema   . COPD (chronic obstructive pulmonary disease) ([facility])     Current Nutrition: NPO at this time.  Chart reviewed and noted mostly 100% of meals consumed per documentation  Food/Nutrition-Related History: MST normal   Scheduled Medications:  . [MAR Hold] atorvastatin  40 mg Oral QPM  . [MAR Hold] enoxaparin (LOVENOX) injection  40 mg Subcutaneous Q24H  . [MAR Hold] furosemide  40 mg Oral BID  . [MAR Hold] insulin aspart  0-20 Units Subcutaneous TID WC  . [MAR Hold] insulin aspart  0-5 Units Subcutaneous QHS  . [MAR Hold] insulin aspart protamine- aspart  30 Units Subcutaneous BID WC  . [MAR Hold] linagliptin  5 mg Oral QPM  . [MAR Hold] lisinopril  10 mg Oral QPM  . [MAR Hold] piperacillin-tazobactam (ZOSYN)  IV  3.375 g Intravenous 3 times per day  . [MAR Hold] potassium chloride  10 mEq Oral QPM  . [MAR Hold] salmeterol  1 puff Inhalation BID  . [MAR Hold] tiotropium  18 mcg Inhalation Daily       Electrolyte/Renal Profile and Glucose Profile:   Recent Labs Lab 01/14/16 1029 01/15/16 0451 01/16/16 0406 01/19/16 0552  NA 127* 130* 133*  --   K 4.5 3.4* 3.5  --   CL 90* 96* 99*  --   CO2 --   BUN --   CREATININE 0.79 0.68 0.77 0.76  CALCIUM 8.5* 8.2* 8.2*  --   GLUCOSE 238* 93 97  --     Gastrointestinal Profile: Last  BM:1/26    Weight Change: stable wt    Diet Order:  Diet NPO time specified Except for: Sips with Meds  Skin:   reviewed  Height:   Ht Readings from Last 1 Encounters:  01/20/16  (1.803 m)    Weight:   Wt Readings from Last 1 Encounters:  01/20/16 250 lb (113.399 kg)    Ideal Body Weight:     BMI:  Body mass index is 34.88 kg/(m^2).   EDUCATION NEEDS:   No education needs identified at this time  LOW Care Level  Dennis Fitzgerald B. Freida Busman, RD, LDN [phone] (pager) Weekend/On-Call pager [phone])

## 2016-01-20 NOTE — Clinical Social Work Note (Signed)
Clinical Social Work Assessment  Patient Details  Name: Dennis Fitzgerald MRN: [id number] Date of Birth: August 04, 1956  Date of referral:  01/20/16               Reason for consult:  Facility Placement                Permission sought to share information with:  Facility Medical sales representative, Family Supports Permission granted to share information::  Yes, Verbal Permission Granted  Name::        Agency::     Relationship::     Contact Information:     Housing/Transportation Living arrangements for the past 2 months:  Single Family Home Source of Information:  Patient Patient Interpreter Needed:  None Criminal Activity/Legal Involvement Pertinent to Current Situation/Hospitalization:  No - Comment as needed Significant Relationships:  Siblings Lives with:  Self Do you feel safe going back to the place where you live?    Need for family participation in patient care:     Care giving concerns:  Patient resides alone.    Social Worker assessment / plan:  CSW received phone call from patient's sister expressing concern over patient and worried for him if he were to return home. Patient's sister had patient's password and expressed that patient had changed his mind and wanted to pursue rehab. CSW explained that CSW would speak with patient to confirm this and would be happy to initiate a bedsearch. CSW explained the rehab placement process as it pertains to medicaid and reimbursement. Patient expressed understanding of the possibility that he will have to go out of county. Bedsearch initiated.   Employment status:  Disabled (Comment on whether or not currently receiving Disability) Insurance information:  Medicaid In [place] PT Recommendations:    Information / Referral to community resources:  Skilled Nursing Facility  Patient/Family's Response to care:  Patient expressed appreciation for CSW assistance.  Patient/Family's Understanding of and Emotional Response to Diagnosis, Current  Treatment, and Prognosis:  Patient is aware of his limitations and realizes that he may need more assistance than returning home alone.  Emotional Assessment Appearance:  Appears stated age Attitude/Demeanor/Rapport:   (pleasant and cooperative) Affect (typically observed):  Accepting, Calm, Pleasant Orientation:  Oriented to Self, Oriented to Place, Oriented to  Time, Oriented to Situation Alcohol / Substance use:  Not Applicable Psych involvement (Current and /or in the community):  No (Comment)  Discharge Needs  Concerns to be addressed:  Care Coordination Readmission within the last 30 days:  No Current discharge risk:  None Barriers to Discharge:  No Barriers Identified   York Spaniel, LCSW 01/20/2016, 2:04 PM

## 2016-01-20 NOTE — Progress Notes (Signed)
PT Cancellation Note  Patient Details Name: Dennis Fitzgerald MRN: [id number] DOB: [birth year]   Cancelled Treatment:    Reason Eval/Treat Not Completed: Patient at procedure or test/unavailable. Patient currently off the floor for a procedure. PT will hold mobility evaluation until patient is medically appropriate and available.   Kerin Ransom, PT, DPT    01/20/2016, 9:00 AM

## 2016-01-21 ENCOUNTER — Encounter: Payer: Self-pay | Admitting: *Deleted

## 2016-01-21 ENCOUNTER — Inpatient Hospital Stay: Payer: Medicaid Other | Admitting: Certified Registered"

## 2016-01-21 ENCOUNTER — Encounter
Admission: EM | Disposition: A | Discharge status: Skilled Nursing Facility | Payer: Self-pay | Source: Home / Self Care | Attending: Specialist

## 2016-01-21 HISTORY — PX: IRRIGATION AND DEBRIDEMENT FOOT: SHX6602

## 2016-01-21 LAB — GLUCOSE, CAPILLARY
GLUCOSE-CAPILLARY: 132 mg/dL — AB (ref 65–99)
Glucose-Capillary: 172 mg/dL — ABNORMAL HIGH (ref 65–99)
Glucose-Capillary: 161 mg/dL — ABNORMAL HIGH (ref 65–99)
Glucose-Capillary: 136 mg/dL — ABNORMAL HIGH (ref 65–99)
Glucose-Capillary: 183 mg/dL — ABNORMAL HIGH (ref 65–99)
Glucose-Capillary: 118 mg/dL — ABNORMAL HIGH (ref 65–99)

## 2016-01-21 LAB — CREATININE, SERUM
CREATININE: 0.66 mg/dL (ref 0.61–1.24)
GFR calc Af Amer: >60 mL/min (ref >60)

## 2016-01-21 SURGERY — IRRIGATION AND DEBRIDEMENT FOOT
Anesthesia: General | Laterality: Right | Wound class: Contaminated

## 2016-01-21 MED ORDER — MIDAZOLAM HCL 5 MG/5ML IJ SOLN
INTRAMUSCULAR | Status: DC | PRN
  Administered 2016-01-21: 2 mg via INTRAVENOUS

## 2016-01-21 MED ORDER — ONDANSETRON HCL 4 MG/2ML IJ SOLN
INTRAMUSCULAR | Status: DC | PRN
  Administered 2016-01-21: 4 mg via INTRAVENOUS

## 2016-01-21 MED ORDER — PHENYLEPHRINE HCL 10 MG/ML IJ SOLN
INTRAMUSCULAR | Status: DC | PRN
  Administered 2016-01-21 (×4): 100 ug via INTRAVENOUS

## 2016-01-21 MED ORDER — BISACODYL 10 MG RE SUPP
10.0000 mg | Freq: Once | RECTAL | Status: DC
  Filled 2016-01-21: qty 1

## 2016-01-21 MED ORDER — LIDOCAINE HCL (PF) 1 % IJ SOLN
INTRAMUSCULAR | Status: AC
  Filled 2016-01-21: qty 30

## 2016-01-21 MED ORDER — SENNA 8.6 MG PO TABS
1.0000 | ORAL_TABLET | Freq: Every day | ORAL | Status: DC
  Administered 2016-01-21 – 2016-01-26 (×6): 8.6 mg via ORAL
  Filled 2016-01-21 (×6): qty 1

## 2016-01-21 MED ORDER — VANCOMYCIN HCL 1000 MG IV SOLR
INTRAVENOUS | Status: AC
  Filled 2016-01-21: qty 1000

## 2016-01-21 MED ORDER — FENTANYL CITRATE (PF) 100 MCG/2ML IJ SOLN
INTRAMUSCULAR | Status: DC | PRN
  Administered 2016-01-21 (×3): 25 ug via INTRAVENOUS

## 2016-01-21 MED ORDER — GENTAMICIN SULFATE 40 MG/ML IJ SOLN
INTRAMUSCULAR | Status: AC
  Filled 2016-01-21: qty 2

## 2016-01-21 MED ORDER — GENTAMICIN SULFATE 40 MG/ML IJ SOLN
INTRAMUSCULAR | Status: AC
  Filled 2016-01-21: qty 4

## 2016-01-21 MED ORDER — LIDOCAINE HCL (PF) 2 % IJ SOLN
INTRAMUSCULAR | Status: DC | PRN
  Administered 2016-01-21: 50 mg

## 2016-01-21 MED ORDER — GLYCOPYRROLATE 0.2 MG/ML IJ SOLN
INTRAMUSCULAR | Status: DC | PRN
  Administered 2016-01-21: 0.2 mg via INTRAVENOUS

## 2016-01-21 MED ORDER — BUPIVACAINE HCL (PF) 0.5 % IJ SOLN
INTRAMUSCULAR | Status: AC
  Filled 2016-01-21: qty 30

## 2016-01-21 MED ORDER — BUPIVACAINE HCL 0.5 % IJ SOLN
INTRAMUSCULAR | Status: DC | PRN
  Administered 2016-01-21: 8 mL

## 2016-01-21 MED ORDER — ONDANSETRON HCL 4 MG/2ML IJ SOLN: 4.0000 mg | Freq: Once | INTRAMUSCULAR | Status: DC | PRN

## 2016-01-21 MED ORDER — FENTANYL CITRATE (PF) 100 MCG/2ML IJ SOLN: 25.0000 ug | INTRAMUSCULAR | Status: DC | PRN

## 2016-01-21 MED ORDER — PROPOFOL 10 MG/ML IV BOLUS
INTRAVENOUS | Status: DC | PRN
  Administered 2016-01-21: 200 mg via INTRAVENOUS

## 2016-01-21 SURGICAL SUPPLY — 40 items
BAG COUNTER SPONGE EZ (MISCELLANEOUS) IMPLANT
BANDAGE ELASTIC 3 LF NS (GAUZE/BANDAGES/DRESSINGS) IMPLANT
BANDAGE ELASTIC 4 LF NS (GAUZE/BANDAGES/DRESSINGS) IMPLANT
BANDAGE STRETCH 3X4.1 STRL (GAUZE/BANDAGES/DRESSINGS) IMPLANT
BNDG ESMARK 4X12 TAN STRL LF (GAUZE/BANDAGES/DRESSINGS) IMPLANT
BNDG GAUZE 4.5X4.1 6PLY STRL (MISCELLANEOUS) IMPLANT
CANISTER SUCT 1200ML W/VALVE (MISCELLANEOUS) ×3 IMPLANT
COUNTER SPONGE BAG EZ (MISCELLANEOUS)
CUFF TOURN 18 STER (MISCELLANEOUS) IMPLANT
CUFF TOURN DUAL PL 12 NO SLV (MISCELLANEOUS) ×3 IMPLANT
DRAPE FLUOR MINI C-ARM 54X84 (DRAPES) IMPLANT
DURAPREP 26ML APPLICATOR (WOUND CARE) ×3 IMPLANT
ELECT REM PT RETURN 9FT ADLT (ELECTROSURGICAL) ×3
ELECTRODE REM PT RTRN 9FT ADLT (ELECTROSURGICAL) ×1 IMPLANT
GAUZE PETRO XEROFOAM 1X8 (MISCELLANEOUS) IMPLANT
GAUZE SPONGE 4X4 12PLY STRL (GAUZE/BANDAGES/DRESSINGS) IMPLANT
GLOVE BIO SURGEON STRL SZ8 (GLOVE) ×9 IMPLANT
GLOVE INDICATOR 7.5 STRL GRN (GLOVE) ×9 IMPLANT
GOWN STRL REUS W/ TWL LRG LVL3 (GOWN DISPOSABLE) ×3 IMPLANT
GOWN STRL REUS W/TWL LRG LVL3 (GOWN DISPOSABLE) ×6
KIT DRSG VAC SLVR GRANUFM (MISCELLANEOUS) ×3 IMPLANT
KIT RM TURNOVER STRD PROC AR (KITS) ×3 IMPLANT
KIT STIMULAN RAPID CURE 5CC (Orthopedic Implant) ×3 IMPLANT
LABEL OR SOLS (LABEL) ×3 IMPLANT
NEEDLE FILTER BLUNT 18X 1/2SAF (NEEDLE)
NEEDLE FILTER BLUNT 18X1 1/2 (NEEDLE) IMPLANT
NEEDLE HYPO 25X1 1.5 SAFETY (NEEDLE) ×6 IMPLANT
NS IRRIG 500ML POUR BTL (IV SOLUTION) ×3 IMPLANT
PACK EXTREMITY ARMC (MISCELLANEOUS) ×3 IMPLANT
STOCKINETTE STRL 6IN 960660 (GAUZE/BANDAGES/DRESSINGS) ×3 IMPLANT
SUT ETHILON 3-0 FS-10 30 BLK (SUTURE)
SUT ETHILON 4-0 (SUTURE)
SUT ETHILON 4-0 FS2 18XMFL BLK (SUTURE)
SUT VIC AB 3-0 SH 27 (SUTURE)
SUT VIC AB 3-0 SH 27X BRD (SUTURE) IMPLANT
SUT VIC AB 4-0 FS2 27 (SUTURE) IMPLANT
SUTURE EHLN 3-0 FS-10 30 BLK (SUTURE) IMPLANT
SUTURE ETHLN 4-0 FS2 18XMF BLK (SUTURE) IMPLANT
SYR 3ML LL SCALE MARK (SYRINGE) IMPLANT
SYRINGE 10CC LL (SYRINGE) ×6 IMPLANT

## 2016-01-21 NOTE — Op Note (Signed)
Operative note   Surgeon: Dr. Recardo Evangelist, DPM.    Assistant: None    Preop diagnosis: Necrotic infected diabetic wound lateral right foot    Postop diagnosis: Same    Procedure:   1. Debridement of necrotic diabetic infected wound right foot including skin subcutaneous tissue and muscle and tendon.          EBL: 20 cc    Anesthesia:general with local Marcaine block    Hemostasis: None    Specimen: None    Complications: None    Operative indications: Patient had infected bone and soft tissue and skin removed from the ulceration proximally for 5 days ago. Further necrosis and infected tissue was noted superficially and the wound spreading down to some of the tendon and muscle. This needed further debridement today.    Procedure:  Patient was brought into the OR and placed on the operating table in thesupine position. After anesthesia was obtained theright lower extremity was prepped and draped in usual sterile fashion.  Operative Report: Patient was brought to the operating room placed on the OR table in the supine position. At this time attention was directed to the lateral aspect of the right foot where a large ulcer approximately 7 x 5 cm length and width respectively with approximately 4 cm of depth was addressed. At this time a combination of sharp 15 blade along with versa jet was utilized to remove all necrotic infected tissue from the wound this included skin subcutaneous tissue tendon structures and some muscular areas. Bone from that region earlier been resected. Once the nonviable tissue was removed the area was seen to have good bleeding to the region. At this point the area was packed with gentamicin and vancomycin beads and a wound VAC with a silver sponge was placed into the wound and wound VAC established and 725 mmHg continuous. Once this was functional the area was padded with ABDs pads and Kerlix.    Patient tolerated the procedure and anesthesia well.  Was  transported from the OR to the PACU with all vital signs stable and vascular status intact. To be discharged per routine protocol.  Will follow up in approximately 1 week in the outpatient clinic.

## 2016-01-21 NOTE — Clinical Social Work Note (Signed)
No bed offers in [facility]. Search expanded to [facility] and [place]. York Spaniel MSW,LCSW [phone]

## 2016-01-21 NOTE — Evaluation (Signed)
Physical Therapy Evaluation Patient Details Name: Dennis Fitzgerald MRN: [id number] DOB: [birth year] Today's Date: 01/21/2016   History of Present Illness  Pt is a 60 y.o. male presenting to hospital with diabetic R foot ulcer.  MRI showing osteomyelitis 5th metatarsal base and possibly the cuboid.  Pt s/p 01/16/16 debridement and partial removal infected 5th metatarsal R foot; s/p R LE angio 01/20/16; and s/p debridement 01/21/16.  PMH includes diabetes, chronic lymphadema, L fem bypass for PAD.  Clinical Impression  Prior to admission, pt reports being independent and lives alone.  Currently pt is min assist to stand with RW but unable to safely transfer to chair with 1 assist and RW d/t pt's difficulty maintaining NWB'ing R foot and difficulty advancing L LE with use of RW.  Pt would benefit from skilled PT to address noted impairments and functional limitations.  Recommend pt discharge to STR when medically appropriate.     Follow Up Recommendations SNF    Equipment Recommendations       Recommendations for Other Services       Precautions / Restrictions Precautions Precautions: Fall Precaution Comments: Transfers only per original Podiatry consult order; wound vac R foot Restrictions Weight Bearing Restrictions: Yes RLE Weight Bearing: Non weight bearing      Mobility  Bed Mobility Overal bed mobility: Modified Independent Bed Mobility: Supine to Sit;Sit to Supine     Supine to sit: Modified independent (Device/Increase time);HOB elevated Sit to supine: Modified independent (Device/Increase time);HOB elevated      Transfers Overall transfer level: Needs assistance Equipment used: Rolling walker (2 wheeled) Transfers: Sit to/from [employer] Sit to Stand: Min assist Stand pivot transfers: Mod assist;Max assist       General transfer comment: Pt with difficulty maintaining R LE NWB'ing status and difficulty advancing L LE with use of RW so pt assist back down  into sitting for safety  Ambulation/Gait             General Gait Details: transfers only at this time  Stairs            Wheelchair Mobility    Modified Rankin (Stroke Patients Only)       Balance Overall balance assessment: Needs assistance Sitting-balance support: No upper extremity supported;Feet unsupported Sitting balance-Leahy Scale: Good     Standing balance support: Bilateral upper extremity supported;During functional activity (on RW) Standing balance-Leahy Scale: Poor                               Pertinent Vitals/Pain Pain Assessment: 0-10 Pain Score: 5  Pain Location: R foot Pain Descriptors / Indicators: Aching Pain Intervention(s): Limited activity within patient's tolerance;Monitored during session;Repositioned;Patient requesting pain meds-RN notified  Vitals stable and WFL throughout treatment session.    Home Living Family/patient expects to be discharged to:: Skilled nursing facility Living Arrangements: Alone     Home Access: Stairs to enter     Home Layout: One level Home Equipment:  (walker and w/c)      Prior Function Level of Independence: Independent               Hand Dominance        Extremity/Trunk Assessment   Upper Extremity Assessment: Generalized weakness           Lower Extremity Assessment: Generalized weakness;RLE deficits/detail;LLE deficits/detail      Cervical / Trunk Assessment: Normal  Communication   Communication: No difficulties  Cognition  Arousal/Alertness: Awake/alert Behavior During Therapy: Impulsive Overall Cognitive Status: Within Functional Limits for tasks assessed                      General Comments General comments (skin integrity, edema, etc.): R foot wound vac in place    Exercises        Assessment/Plan    PT Assessment Patient needs continued PT services  PT Diagnosis Difficulty walking;Generalized weakness;Acute pain   PT Problem List  Decreased strength;Decreased activity tolerance;Decreased balance;Decreased mobility;Decreased knowledge of use of DME;Decreased knowledge of precautions;Impaired sensation;Pain;Decreased skin integrity  PT Treatment Interventions DME instruction;Gait training;Stair training;Functional mobility training;Therapeutic activities;Therapeutic exercise;Balance training;Patient/family education   PT Goals (Current goals can be found in the Care Plan section) Acute Rehab PT Goals Patient Stated Goal: to go to rehab PT Goal Formulation: With patient Time For Goal Achievement: 02/04/16 Potential to Achieve Goals: Good    Frequency Min 2X/week   Barriers to discharge Decreased caregiver support      Co-evaluation               End of Session Equipment Utilized During Treatment: Gait belt Activity Tolerance: Patient limited by fatigue Patient left: in bed;with call bell/phone within reach (Pt refused bed alarm; R LE elevated on 2 pillows) Nurse Communication: Mobility status;Precautions;Weight bearing status         Time: 1610-9604 PT Time Calculation (min) (ACUTE ONLY): 25 min   Charges:   PT Evaluation $PT Eval Moderate Complexity: 1 Procedure     PT G CodesHendricks Limes 01/29/2016, 6:05 PM Hendricks Limes, PT [phone]

## 2016-01-21 NOTE — Anesthesia Preprocedure Evaluation (Signed)
Anesthesia Evaluation  Patient identified by MRN, date of birth, ID band Patient awake    Reviewed: Allergy & Precautions, NPO status , Patient's Chart, lab work & pertinent test results  History of Anesthesia Complications Negative for: history of anesthetic complications  Airway Mallampati: II       Dental   Pulmonary COPD,  COPD inhaler, Current Smoker,     + decreased breath sounds      Cardiovascular hypertension, Pt. on medications  Rhythm:Regular Rate:Normal     Neuro/Psych    GI/Hepatic negative GI ROS, Neg liver ROS,   Endo/Other  diabetes, Type 1, Insulin DependentMorbid obesity  Renal/GU negative Renal ROS     Musculoskeletal   Abdominal (+) + obese,   Peds  Hematology negative hematology ROS (+)   Anesthesia Other Findings   Reproductive/Obstetrics                             Anesthesia Physical  Anesthesia Plan  ASA: III  Anesthesia Plan: General   Post-op Pain Management:    Induction: Intravenous  Airway Management Planned: LMA  Additional Equipment:   Intra-op Plan:   Post-operative Plan: Extubation in OR  Informed Consent: I have reviewed the patients History and Physical, chart, labs and discussed the procedure including the risks, benefits and alternatives for the proposed anesthesia with the patient or authorized representative who has indicated his/her understanding and acceptance.     Plan Discussed with: CRNA  Anesthesia Plan Comments:         Anesthesia Quick Evaluation

## 2016-01-21 NOTE — Anesthesia Procedure Notes (Signed)
Procedure Name: LMA Insertion Performed by: Tahjay Binion Pre-anesthesia Checklist: Patient identified, Patient being monitored, Timeout performed, Emergency Drugs available and Suction available Patient Re-evaluated:Patient Re-evaluated prior to inductionOxygen Delivery Method: Circle system utilized Preoxygenation: Pre-oxygenation with 100% oxygen Intubation Type: IV induction Ventilation: Mask ventilation without difficulty LMA: LMA inserted LMA Size: 5.0 Tube type: Oral Number of attempts: 1 Placement Confirmation: positive ETCO2 and breath sounds checked- equal and bilateral Tube secured with: Tape Dental Injury: Teeth and Oropharynx as per pre-operative assessment      

## 2016-01-21 NOTE — Transfer of Care (Signed)
  Immediate Anesthesia Transfer of Care Note  Patient: Dennis Fitzgerald  Procedure(s) Performed: Procedure(s): IRRIGATION AND DEBRIDEMENT FOOT (Right)  Patient Location: PACU  Anesthesia Type:General  Level of Consciousness: awake  Airway & Oxygen Therapy: Patient Spontanous Breathing and Patient connected to face mask oxygen  Post-op Assessment: Report given to RN  Post vital signs: Reviewed  Last Vitals:  Filed Vitals:   01/21/16 0845 01/21/16 1032  BP: 148/78 125/72  Pulse: 77 89  Temp: 37.8 C 37.2 C  Resp: 18 19    Complications: No apparent anesthesia complications

## 2016-01-21 NOTE — Addendum Note (Signed)
Addendum  created 01/21/16 1059 by Mathews Argyle, CRNA   Modules edited: Anesthesia Flowsheet

## 2016-01-21 NOTE — Progress Notes (Signed)
Patient taken off unit for debridement at this time with OR transport.

## 2016-01-21 NOTE — Progress Notes (Signed)
Inpatient Diabetes Program Recommendations  AACE/ADA: NeADONIJAH, BAENAs Statement on Inpatient Glycemic Control (2015)  Target Ranges:  Prepandial:   less than 140 mg/dL      Peak postprandial:   less than 180 mg/dL (1-2 hours)      Critically ill patients:  140 - 180 mg/dL   Review of Glycemic Control  Results for Schnebly, Eri (MRN [id number]) as of 01/21/2016 08:55  Ref. Range 01/19/2016 23:14 01/20/2016 07:45 01/20/2016 11:22 01/20/2016 21:27 01/21/2016 07:34  Glucose-Capillary Latest Ref Range: 65-99 mg/dL 71 409 (H) 811 (H) 914 (H) 172 (H)    Diabetes history: Type 2 Outpatient Diabetes medications: Novolin 70/30 55 units qam, 50 units qpm, Metformin  bid, Tradgenta /day   Current orders for Inpatient glycemic control: 70/30 30 units qam, 70/30 20 units qam , Novolog 0-20 units TID with meals, Novolog 0-5 units HS, Tradjenta 5 mg QPM  Inpatient Diabetes Program Recommendations:  Patient did not receive am 70/30 insulin today as suggested by diabetes coordinator (yesterday) but DID appropriately receive Novolog correction prior to surgery as ordered. * Noted that pm insulin was decreased to 20 units and fasting blood sugar was /dl.  No recommendations at this time.  Susette Racer, RN, BA, MHA, CDE Diabetes Coordinator Inpatient Diabetes Program  [phone] (Team Pager) [phone] [facility] Office) 01/21/2016 9:35 AM

## 2016-01-21 NOTE — Anesthesia Postprocedure Evaluation (Signed)
Anesthesia Post Note  Patient: Dennis Fitzgerald  Procedure(s) Performed: Procedure(s) (LRB): IRRIGATION AND DEBRIDEMENT FOOT (Right)  Patient location during evaluation: PACU Anesthesia Type: General Level of consciousness: awake and alert Pain management: pain level controlled Vital Signs Assessment: post-procedure vital signs reviewed and stable Respiratory status: spontaneous breathing and respiratory function stable Cardiovascular status: stable Anesthetic complications: no    Last Vitals:  Filed Vitals:   01/21/16 0845 01/21/16 1032  BP: 148/78 125/72  Pulse: 77 89  Temp: 37.8 C 37.2 C  Resp: 18 19    Last Pain:  Filed Vitals:   01/21/16 1032  PainSc: 0-No pain                 Lottie Sigman K

## 2016-01-21 NOTE — Progress Notes (Signed)
Infectious Disease Long Term IV Antibiotic Orders  Diagnosis: DM foot osteomyelitis  Jamarrion Budai is a 60 y.o. male DM, chronic lymphedema, tobacco abuse, PAD s/p bypass on L admitted with worsening of R LE foot wound. Had been following with wound center. At admit had wbc 13.6, wound cx with enterobacter (grm + cocci also noted on GS) . MRI showed osteomyelitis 5th Metatarsal and also possible cuboid and navicular. ESR 40, CRP 9.6 1/28 underwent I and D and removal of prox 5th MT - abx beads placed and wound vac placed.  Bone cx . MRSA screen is negative S/p angiogram 2/1 S/p I and D  01/21/16  Recommendations Cont zosyn for now. Given concern for underlying remaining osteomyelitis will need IV abx for likely 4-6 weeks. Would likely continue zosyn for broad coverage including anaerobes given extent of initial wound and foul odor with it. Cannot use ceftriaxone in Enterobacter infection due to inducible resistance.  Zosyn will likely be difficult for him to give if he goes home and will be expensive. WIll consider other options   Culture results Enterobacter cloacae - S ctx, cefepime, cipro zosyn bactrim    Lab Results  Component Value Date   CREATININE 0.66 01/21/2016   Lab Results  Component Value Date   HGB 12.7* 01/19/2016   Lab Results  Component Value Date   ESRSEDRATE 40* 01/12/2016   Lab Results  Component Value Date   CRP 9.6* 01/12/2016     Allergies: No Known Allergies  Discharge antibiotics Ceftriaxone 2 grams every        24      hours  Ciprofloxacin oral 500 bid   PICC Care per protocol Labs weekly while on IV antibiotics      CBC w diff   Cr, liver function tests  CRP   Planned duration of antibiotics 4 weeks from surgery 2/2  Stop date 02/18/16 Follow up clinic date TBD  FAX weekly labs to [phone]  Leonel Ramsay, MD

## 2016-01-21 NOTE — Progress Notes (Signed)
[facility] [place] [facility]   PATIENT NAME: Dennis Fitzgerald    MR#:  [id number]  DATE OF BIRTH:  July 21, 1956  SUBJECTIVE:    Pt. Here due to a right foot diabetic foot ulcer and noted to have osteomyelitis of the right fifth metatarsal. Patient is status post removal of the infected fifth metatarsal proximal half and debridement of the underlying soft tissue infection POD # 5. Pt. Went for further debridement of his wound today.  BS stable.  No other complaints.    REVIEW OF SYSTEMS:    Review of Systems  Constitutional: Negative for fever and chills.  HENT: Negative for congestion and tinnitus.   Eyes: Negative for blurred vision and double vision.  Respiratory: Negative for cough, shortness of breath and wheezing.   Cardiovascular: Negative for chest pain, orthopnea and PND.  Gastrointestinal: Negative for nausea, vomiting, abdominal pain and diarrhea.  Genitourinary: Negative for dysuria and hematuria.  Neurological: Negative for dizziness, sensory change and focal weakness.  All other systems reviewed and are negative.   Nutrition: Carb modified.  Tolerating Diet: yes Tolerating PT: Eval. Noted.    DRUG ALLERGIES:  No Known Allergies  VITALS:  Blood pressure 129/76, pulse 79, temperature 97.7 F (36.5 C), temperature source Oral, resp. rate 18, height  (1.803 m), weight 113.399 kg (250 lb), SpO2 93 %.  PHYSICAL EXAMINATION:   Physical Exam  GENERAL:  60 y.o.-year-old obese patient lying in the bed in no acute distress.  EYES: Pupils equal, round, reactive to light and accommodation. No scleral icterus. Extraocular muscles intact.  HEENT: Head atraumatic, normocephalic. Oropharynx and nasopharynx clear.  NECK:  Supple, no jugular venous distention. No thyroid enlargement, no tenderness.  LUNGS: Normal breath sounds bilaterally, no wheezing, rales, rhonchi. No use of accessory muscles of respiration.  CARDIOVASCULAR: S1, S2 normal. No  murmurs, rubs, or gallops.  ABDOMEN: Soft, nontender, nondistended. Bowel sounds present. No organomegaly or mass.  EXTREMITIES: No cyanosis, clubbing, chronic lymphedema b/l.  Right Lower ext. wound dressing w/ wound vac in place. No acute drainage noted.     NEUROLOGIC: Cranial nerves II through XII are intact. No focal Motor or sensory deficits b/l.   PSYCHIATRIC: The patient is alert and oriented x 3. Good affect.    SKIN: No obvious rash, lesion, or ulcer.    LABORATORY PANEL:   CBC  Recent Labs Lab 01/19/16 0552  WBC 4.7  HGB 12.7*  HCT 37.8*  PLT 180   ------------------------------------------------------------------------------------------------------------------  Chemistries   Recent Labs Lab 01/16/16 0406  01/21/16 0535  NA 133*  --   --   K 3.5  --   --   CL 99*  --   --   CO2 29  --   --   GLUCOSE 97  --   --   BUN 6  --   --   CREATININE 0.77  < > 0.66  CALCIUM 8.2*  --   --   < > = values in this interval not displayed. ------------------------------------------------------------------------------------------------------------------  Cardiac Enzymes No results for input(s): TROPONINI in the last 168 hours. ------------------------------------------------------------------------------------------------------------------  RADIOLOGY:  Dg Chest Port 1 View  01/19/2016  CLINICAL DATA:  PICC placement.  COPD. EXAM: PORTABLE CHEST 1 VIEW COMPARISON:  CT 02/11/2014 FINDINGS: Tip of the right upper extremity PICC in the mid-distal SVC. No pneumothorax. Chronic elevation of right hemidiaphragm. Mild increase in atelectasis at the right lung base. Cardiomediastinal contours are unchanged. Questionable vascular congestion versus  technique. No pleural effusion. Questionable focus of air in the soft tissue of the right axilla. IMPRESSION: 1. Tip of the right upper extremity PICC in the mid distal SVC. 2. Chronic elevation of right hemidiaphragm with mild increase in  right basilar atelectasis. 3. Questionable vascular congestion. 4. Probable small focus of air in the soft tissues in the right axilla. This may be procedure related related, however recommend correlation to exclude soft tissue infection. Electronically Signed   By: Rubye Oaks M.D.   On: 01/19/2016 21:27     ASSESSMENT AND PLAN:   60 year old male with past history of diabetes type 2, chronic lymphedema, COPD, hyperlipidemia, hypertension who presented to the hospital due to right lower extremity diabetic foot ulcer.  #1 right lower extremity diabetic foot ulcer/Osteomyelitis-appreciate podiatry help and patient is status post debridement and partial removal of the infected fifth metatarsal on the right foot POD # 5. Pt. Went for further debridement today as per Podiatry.  -Continue IV Zosyn. Wound cultures growing Enterobacter presently. -Appreciate infectious disease input and patient will likely need 4-6 weeks of antibiotics and s/p PICC Line now.  -s/p angiogram yesterday and PCA to the right superficial femoral artery.   - cont. Current supportive care.   #2 type 2 diabetes without complication-BS stable. Cont. Novolin 70/30 twice daily, Tradjenta.   - appreciates Diabetes Coordinator help.   #3 hypertension-continue lisinopril.   #4 COPD-without acute exacerbation. Continue Spiriva, Serevent.  #5 hyperlipidemia-continue atorvastatin.  Patient likely will need short-term rehabilitation and case management made aware and they have started bed search and no offers in county and expanding search out of county now.   All the records are reviewed and case discussed with Care Management/Social Worker. Management plans discussed with the patient, family and they are in agreement.  CODE STATUS: Full  DVT Prophylaxis: Lovenox  TOTAL TIME TAKING CARE OF THIS PATIENT: 25 minutes.   POSSIBLE D/C IN 1-2 days, DEPENDING ON CLINICAL CONDITION.   Houston Siren M.D on 01/21/2016 at  3:25 PM  Between 7am to 6pm - Pager - [phone]  After 6pm go to [email or web address] - password [email or web address] [facility]  [facility]  Office  [phone]  CC: Primary care physician; Leanna Sato, MD

## 2016-01-21 NOTE — Progress Notes (Signed)
Pressure dressing to left groin removed. Patient tolerated this very well. No bleeding or swelling noted at the insertion site. Will continue to monitor

## 2016-01-21 NOTE — Progress Notes (Signed)
PT Cancellation Note  Patient Details Name: Dennis Fitzgerald MRN: [id number] DOB: 11-29-1956   Cancelled Treatment:    Reason Eval/Treat Not Completed: Medical issues which prohibited therapy (Per discussion with primary RN, patient currently preparing to leave unit for R LE procedure (irrigation/debridement, wound vac change).  Will likely need new orders post-procedure to initiate PT if completed under general anesthesia.  Will continue to follow and initiate evaluation as appropriate.)   Ommie Degeorge H. Manson Passey, PT, DPT, NCS 01/21/2016, 8:28 AM [phone]

## 2016-01-21 NOTE — H&P (Signed)
H and P has been reviewed and no changes are noted.  

## 2016-01-21 NOTE — Progress Notes (Signed)
Pharmacy Antibiotic Note  Dennis Fitzgerald is a 60 y.o. male admitted on 01/14/2016 with osteomyelitis and diabetic foot infection.  Pharmacy has been consulted for piperacillin/tazobactam dosing.  Plan: Zosyn 3.375g IV q8h (4 hour infusion).   This is day # 7 of piperacillin/tazobactam. Patient underwent another debridement this morning. Per ID, patient will likely need 4-6 weeks of IV antibiotics.   Height:  (180.3 cm) Weight: 250 lb (113.399 kg) IBW/kg (Calculated) : 75.3  Temp (24hrs), Avg:98.9 F (37.2 C), Min:98.3 F (36.8 C), Max:100.1 F (37.8 C)   Recent Labs Lab 01/15/16 0451  01/16/16 0406 01/17/16 0409 01/18/16 0408 01/19/16 0552 01/21/16 0535  WBC 9.3  --  7.3  --   --  4.7  --   CREATININE 0.68  --  0.77  --   --  0.76 0.66  VANCOTROUGH  --   < > --   --   < > = values in this interval not displayed.  Estimated Creatinine Clearance: 127.3 mL/min (by C-G formula based on Cr of 0.66).    No Known Allergies  Antimicrobials this admission: Piperacillin/tazobactam 1/26 >>  vancomycin 1/26 >> 1/30  Microbiology results: 1/28 Wound culture: enterobacter pan-sensitive  Thank you for allowing pharmacy to be a part of this patient's care.  Cindi Carbon, PharmD Clinical Pharmacist 01/21/2016 11:42 AM

## 2016-01-22 ENCOUNTER — Encounter: Payer: Self-pay | Admitting: Vascular Surgery

## 2016-01-22 LAB — GLUCOSE, CAPILLARY
GLUCOSE-CAPILLARY: 82 mg/dL (ref 65–99)
GLUCOSE-CAPILLARY: 89 mg/dL (ref 65–99)
GLUCOSE-CAPILLARY: 98 mg/dL (ref 65–99)
Glucose-Capillary: 129 mg/dL — ABNORMAL HIGH (ref 65–99)
Glucose-Capillary: 47 mg/dL — ABNORMAL LOW (ref 65–99)

## 2016-01-22 LAB — CBC
HEMATOCRIT: 38.2 % — AB (ref 40.0–52.0)
HEMOGLOBIN: 12.7 g/dL — AB (ref 13.0–18.0)
MCH: 27.2 pg (ref 26.0–34.0)
MCHC: 33.3 g/dL (ref 32.0–36.0)
MCV: 81.7 fL (ref 80.0–100.0)
Platelets: 92 10*3/uL — ABNORMAL LOW (ref 150–440)
RBC: 4.68 MIL/uL (ref 4.40–5.90)
RDW: 44.2 % — AB (ref 11.5–14.5)
WBC: 4.6 10*3/uL (ref 3.8–10.6)

## 2016-01-22 MED ORDER — DEXTROSE 5 % IV SOLN
2.0000 g | INTRAVENOUS | Status: DC
  Administered 2016-01-22 – 2016-01-26 (×5): 2 g via INTRAVENOUS
  Filled 2016-01-22 (×6): qty 2

## 2016-01-22 MED ORDER — CIPROFLOXACIN HCL 250 MG PO TABS
500.0000 mg | ORAL_TABLET | Freq: Two times a day (BID) | ORAL | Status: DC
  Administered 2016-01-22 – 2016-01-26 (×9): 500 mg via ORAL
  Filled 2016-01-22 (×10): qty 2

## 2016-01-22 NOTE — Clinical Social Work Note (Signed)
Currently no bed offers from [place], [place], or [place] counties. Patient is not helping with is recovery as he declined to work with PT yesterday as well.  York Spaniel MSW,LCSW [phone]

## 2016-01-22 NOTE — Progress Notes (Signed)
Patient Demographics  Dennis Fitzgerald, is a 60 y.o. male   MRN: [id number]   DOB - 09-Oct-1956  Admit Date - 01/14/2016    Outpatient Primary MD for the patient is Leanna Sato, MD  Consult requested in the Hospital by Houston Siren, MD, On 01/22/2016   With History of -  Past Medical History  Diagnosis Date  . Diabetes mellitus without complication (HCC)   . Lymphedema   . COPD (chronic obstructive pulmonary disease) [facility])       Past Surgical History  Procedure Laterality Date  . Incision and drainage of wound Right 01/16/2016    Procedure: IRRIGATION AND DEBRIDEMENT WOUND;  Surgeon: Recardo Evangelist, DPM;  Location: [facility] ORS;  Service: Podiatry;  Laterality: Right;  . Application of wound vac Right 01/16/2016    Procedure: APPLICATION OF WOUND VAC;  Surgeon: Recardo Evangelist, DPM;  Location: [facility] ORS;  Service: Podiatry;  Laterality: Right;  . Irrigation and debridement foot Right 01/21/2016    Procedure: IRRIGATION AND DEBRIDEMENT FOOT;  Surgeon: Recardo Evangelist, DPM;  Location: [facility] ORS;  Service: Podiatry;  Laterality: Right;    in for   Chief Complaint  Patient presents with  . Wound Infection     HPI patient had osteomyelitis to his fifth metatarsal shaft and base.  Dennis Fitzgerald  is a 60 y.o. male, patient had osteomyelitis to his fifth metatarsal shaft and base. Underwent surgery for this on Saturday needed a further debridement of the wound yesterday which was accomplished. Had angioplasty done on Wednesday. He is stable to this point surgery progressing well. Physical therapy is working with him to try and achieve transfer but he is weak.    Review of Systems    In addition to the HPI above,  No Fever-chills, No Headache, No changes with Vision or hearing, No problems swallowing food or  Liquids, No Chest pain, Cough or Shortness of Breath, No Abdominal pain, No Nausea or Vommitting, Bowel movements are regular, No Blood in stool or Urine, No dysuria, No new skin rashes or bruises, No new joints pains-aches,  No new weakness, tingling, numbness in any extremity, No recent weight gain or loss, No polyuria, polydypsia or polyphagia, No significant Mental Stressors.  A full 10 point Review of Systems was done, except as stated above, all other Review of Systems were negative.   Social History Social History  Substance Use Topics  . Smoking status: Current Every Day Smoker  . Smokeless tobacco: Not on file  . Alcohol Use: No     Family History History reviewed. No pertinent family history.   Prior to Admission medications   Medication Sig Start Date End Date Taking? Authorizing Provider  albuterol (PROVENTIL HFA;VENTOLIN HFA) 108 (90 Base) MCG/ACT inhaler Inhale 2 puffs into the lungs every 4 (four) hours as needed for wheezing or shortness of breath.   Yes Historical Provider, MD  aspirin EC 81 MG tablet Take 81 mg by mouth every evening.   Yes Historical Provider, MD  atorvastatin (LIPITOR) 40 MG tablet Take 40 mg by mouth every evening.   Yes Historical Provider, MD  ciprofloxacin (CIPRO) 500 MG tablet Take 1 tablet (500 mg total) by  mouth 2 (two) times daily. 01/12/16  Yes Sharman Cheek, MD  clindamycin (CLEOCIN) 150 MG capsule Take 3 capsules (450 mg total) by mouth 3 (three) times daily. 01/12/16  Yes Sharman Cheek, MD  clopidogrel (PLAVIX) 75 MG tablet Take 75 mg by mouth every evening.   Yes Historical Provider, MD  collagenase (SANTYL) ointment Apply 1 application topically 2 (two) times daily.   Yes Historical Provider, MD  furosemide (LASIX) 40 MG tablet Take 40 mg by mouth 2 (two) times daily.   Yes Historical Provider, MD  insulin NPH-regular Human (NOVOLIN 70/30) (70-30) 100 UNIT/ML injection Inject 50-55 Units into the skin 2 (two) times daily. Pt  uses 55 units in the morning and 50 at night.   Yes Historical Provider, MD  linagliptin (TRADJENTA) 5 MG TABS tablet Take 5 mg by mouth every evening.    Yes Historical Provider, MD  lisinopril (PRINIVIL,ZESTRIL) 10 MG tablet Take 10 mg by mouth every evening.   Yes Historical Provider, MD  metFORMIN (GLUCOPHAGE) 1000 MG tablet Take 1,000 mg by mouth 2 (two) times daily.   Yes Historical Provider, MD  potassium chloride (K-DUR,KLOR-CON) 10 MEQ tablet Take 10 mEq by mouth every evening.   Yes Historical Provider, MD  Umeclidinium-Vilanterol (ANORO ELLIPTA) 62.5-25 MCG/INH AEPB Inhale 1 puff into the lungs every evening.    Yes Historical Provider, MD    Anti-infectives    Start     Dose/Rate Route Frequency Ordered Stop   01/16/16 1200  vancomycin (VANCOCIN) 1,250 mg in sodium chloride 0.9 % 250 mL IVPB  Status:  Discontinued     1,250 mg 166.7 mL/hr over 90 Minutes Intravenous Every 8 hours 01/16/16 0430 01/18/16 1401   01/14/16 2000  vancomycin (VANCOCIN) IVPB 1000 mg/200 mL premix  Status:  Discontinued     1,000 mg 200 mL/hr over 60 Minutes Intravenous Every 8 hours 01/14/16 1636 01/16/16 0430   01/14/16 2000  piperacillin-tazobactam (ZOSYN) IVPB 3.375 g     3.375 g 12.5 mL/hr over 240 Minutes Intravenous 3 times per day 01/14/16 1636     01/14/16 1245  piperacillin-tazobactam (ZOSYN) IVPB 3.375 g     3.375 g 12.5 mL/hr over 240 Minutes Intravenous  Once 01/14/16 1241 01/14/16 1524   01/14/16 1245  vancomycin (VANCOCIN) IVPB 1000 mg/200 mL premix     1,000 mg 200 mL/hr over 60 Minutes Intravenous  Once 01/14/16 1244 01/14/16 1440      Scheduled Meds: . atorvastatin  40 mg Oral QPM  . bisacodyl  10 mg Rectal Once  . enoxaparin (LOVENOX) injection  40 mg Subcutaneous Q24H  . furosemide  40 mg Oral BID  . insulin aspart  0-20 Units Subcutaneous TID WC  . insulin aspart  0-5 Units Subcutaneous QHS  . insulin aspart protamine- aspart  20 Units Subcutaneous Q supper  . insulin  aspart protamine- aspart  30 Units Subcutaneous Q breakfast  . lactulose  30 g Oral BID  . linagliptin  5 mg Oral QPM  . lisinopril  10 mg Oral QPM  . piperacillin-tazobactam (ZOSYN)  IV  3.375 g Intravenous 3 times per day  . potassium chloride  10 mEq Oral QPM  . salmeterol  1 puff Inhalation BID  . senna  1 tablet Oral Daily  . tiotropium  18 mcg Inhalation Daily   Continuous Infusions: . sodium chloride 75 mL/hr at 01/20/16 1745   PRN Meds:.acetaminophen, albuterol, magic mouthwash, morphine injection, oxyCODONE  No Known Allergies  Physical Exam  Vitals  Blood pressure 123/67, pulse 79, temperature 98.2 F (36.8 C), temperature source Oral, resp. rate 18, height 5\' 11"  (1.803 m), weight 113.399 kg (250 lb), SpO2 92 %.  Lower Extremity exam: Wound vacs intact and stable. It is functional at this point. I was pleased with the debridement result yesterday was able to remove all infected tissue and nylon devitalized or necrotic tissue as well. His white count is considerably down and seems to be stable with infection at this point.   Data Review  CBC  Recent Labs Lab 01/16/16 0406 01/19/16 0552  WBC 7.3 4.7  HGB 13.0 12.7*  HCT 38.4* 37.8*  PLT 218 180  MCV 82.6 83.9  MCH 27.9 28.3  MCHC 33.7 33.7  RDW 14.9* 15.2*  LYMPHSABS  --  0.3*  MONOABS  --  0.5  EOSABS  --  0.3  BASOSABS  --  0.0   ------------------------------------------------------------------------------------------------------------------  Chemistries   Recent Labs Lab 01/16/16 0406 01/19/16 0552 01/21/16 0535  NA 133*  --   --   K 3.5  --   --   CL 99*  --   --   CO2 29  --   --   GLUCOSE 97  --   --   BUN 6  --   --   CREATININE 0.77 0.76 0.66  CALCIUM 8.2*  --   --    ------------------------------------------------------------------------------------------------------------------ estimated creatinine clearance is 127.3 mL/min (by C-G formula based on Cr of  0.66). ------------------------------------------------------------------------------------------------------------------ No results for input(s): TSH, T4TOTAL, T3FREE, THYROIDAB in the last 72 hours.  Invalid input(s): FREET3 Assessment & Plan: Overall I think he is more stable as infection hopefully with the successful angioplasty and debridement of infected tissue and utilization the wound VAC will start to see some healing to this wound. It is in an awkward position with compromise to the lateral tendon structure and peroneus brevis weakness a definitive reality. Right now needs to continue with wound VAC. Needs to continue with nonweightbearing on the right foot and continue with physical therapy. Also needs to continue with IV antibiotics. Likely needs a new PICC line placement. Appreciate input from Dr. Sampson Goon regarding infection control. Should be okay to go to rehabilitation and sent as a PICC line and is available and Dr. Sampson Goon establishes antibiotics. Needs to continue with physical therapy assistance on transfer.  Active Problems:   Diabetic foot ulcer (HCC)     Family Communication: Plan discussed with patient .     Epimenio Sarin M.D on 01/22/2016 at 8:52 AM  Thank you for the consult, we will follow the patient with you in the Hospital.

## 2016-01-22 NOTE — Clinical Social Work Note (Signed)
Still no bed offers made at this time.  CSW has updated patient and he is aware that currently we are still searching. CSW encouraged patient to work with PT when they come to see him in order for him to get stronger. York Spaniel MSW,LCSW

## 2016-01-22 NOTE — Progress Notes (Signed)
[facility] - [place] at [facility]   PATIENT NAME: Dennis Fitzgerald    MR#:  [id number]  DATE OF BIRTH:  12/28/1955  SUBJECTIVE:    Pt. Here due to a right foot diabetic foot ulcer and noted to have osteomyelitis of the right fifth metatarsal. Patient is status post removal of the infected fifth metatarsal proximal half and debridement of the underlying soft tissue infection POD # 6. Pt. Went for further debridement of his wound yesterday.  BS stable.  No other complaints.    REVIEW OF SYSTEMS:    Review of Systems  Constitutional: Negative for fever and chills.  HENT: Negative for congestion and tinnitus.   Eyes: Negative for blurred vision and double vision.  Respiratory: Negative for cough, shortness of breath and wheezing.   Cardiovascular: Negative for chest pain, orthopnea and PND.  Gastrointestinal: Negative for nausea, vomiting, abdominal pain and diarrhea.  Genitourinary: Negative for dysuria and hematuria.  Neurological: Negative for dizziness, sensory change and focal weakness.  All other systems reviewed and are negative.   Nutrition: Carb modified.  Tolerating Diet: yes Tolerating PT: Eval. Noted.    DRUG ALLERGIES:  No Known Allergies  VITALS:  Blood pressure 123/67, pulse 79, temperature 98.2 F (36.8 C), temperature source Oral, resp. rate 18, height  (1.803 m), weight 113.399 kg (250 lb), SpO2 92 %.  PHYSICAL EXAMINATION:   Physical Exam  GENERAL:  60 y.o.-year-old obese patient lying in the bed in no acute distress.  EYES: Pupils equal, round, reactive to light and accommodation. No scleral icterus. Extraocular muscles intact.  HEENT: Head atraumatic, normocephalic. Oropharynx and nasopharynx clear.  NECK:  Supple, no jugular venous distention. No thyroid enlargement, no tenderness.  LUNGS: Normal breath sounds bilaterally, no wheezing, rales, rhonchi. No use of accessory muscles of respiration.  CARDIOVASCULAR: S1, S2  normal. No murmurs, rubs, or gallops.  ABDOMEN: Soft, nontender, nondistended. Bowel sounds present. No organomegaly or mass.  EXTREMITIES: No cyanosis, clubbing, + chronic lymphedema b/l.  Right Lower ext. wound dressing w/ wound vac in place. No acute drainage noted.     NEUROLOGIC: Cranial nerves II through XII are intact. No focal Motor or sensory deficits b/l.  Globally weak.   PSYCHIATRIC: The patient is alert and oriented x 3. Good affect.    SKIN: No obvious rash, lesion, or ulcer.    LABORATORY PANEL:   CBC  Recent Labs Lab 01/22/16 0537  WBC 4.6  HGB 12.7*  HCT 38.2*  PLT 92*   ------------------------------------------------------------------------------------------------------------------  Chemistries   Recent Labs Lab 01/16/16 0406  01/21/16 0535  NA 133*  --   --   K 3.5  --   --   CL 99*  --   --   CO2 29  --   --   GLUCOSE 97  --   --   BUN 6  --   --   CREATININE 0.77  < > 0.66  CALCIUM 8.2*  --   --   < > = values in this interval not displayed. ------------------------------------------------------------------------------------------------------------------  Cardiac Enzymes No results for input(s): TROPONINI in the last 168 hours. ------------------------------------------------------------------------------------------------------------------  RADIOLOGY:  No results found.   ASSESSMENT AND PLAN:   60 year old male with past history of diabetes type 2, chronic lymphedema, COPD, hyperlipidemia, hypertension who presented to the hospital due to right lower extremity diabetic foot ulcer.  #1 right lower extremity diabetic foot ulcer/Osteomyelitis-appreciate podiatry help and patient is status post debridement and partial  removal of the infected fifth metatarsal on the right foot POD # 6.  -Wound cultures growing Enterobacter presently. -Appreciate infectious disease input and patient will likely need 4-6 weeks of antibiotics and s/p PICC Line  -  as per ID switched to IV Ceftriaxone and Oral Cipro.   -s/p angiogram 01/19/16 and PCA to the right superficial femoral artery.   - cont. Current supportive care.   #2 type 2 diabetes without complication-BS stable. Cont. Novolin 70/30 twice daily, Tradjenta.   - appreciates Diabetes Coordinator help.   #3 hypertension-continue lisinopril.   #4 COPD-without acute exacerbation. Continue Spiriva, Serevent.  #5 hyperlipidemia-continue atorvastatin.  #6 Thrombocytopenia - etiology unclear.  ?? Related to infection (vs) HIT  - will order HIT panel and cont. Lovenox for now as pt is high DVT risk and monitor Plt count.   Patient likely will need short-term rehabilitation and case management made aware and they have started bed search and no offers in county and expanding search out of county now.   All the records are reviewed and case discussed with Care Management/Social Worker. Management plans discussed with the patient, family and they are in agreement.  CODE STATUS: Full  DVT Prophylaxis: Lovenox  TOTAL TIME TAKING CARE OF THIS PATIENT: 30 minutes.   POSSIBLE D/C IN 2-3 days, DEPENDING ON CLINICAL CONDITION.  Greater than 50% of time spent in coordination of care in discussion with Social Work.   Houston Siren M.D on 01/22/2016 at 3:24 PM  Between 7am to 6pm - Pager - [phone]  After 6pm go to [email or web address] - password [email or web address] [facility]  [facility]  Office  [phone]  CC: Primary care physician; Leanna Sato, MD

## 2016-01-22 NOTE — Care Management (Signed)
Faxed heads up referral to [facility] as discussed with Rickie as discussed earlier in the week.  She is aware that attempts are being made to pace the patient but may not be successful

## 2016-01-22 NOTE — Clinical Social Work Note (Signed)
CSW had lengthy discussion with patient's sister this afternoon: Doris: [phone]. She is aware that we are having to search multiple counties and may not be able to find a facility that can take patient. She is aware that there is a possibility that patient may have to return home with home health if patient can be safe at home with his care.  York Spaniel MSW,LCSW [phone]

## 2016-01-22 NOTE — Plan of Care (Signed)
Problem: Safety: Goal: Ability to remain free from injury will improve Outcome: Progressing Pt refuses bed alarm   Problem: Pain Managment: Goal: General experience of comfort will improve Outcome: Progressing Pt pain of 6-7/10 is managed with PRN Oxycodone  Problem: Physical Regulation: Goal: Will remain free from infection Outcome: Progressing Pt is showing no signs of infection   Problem: Bowel/Gastric: Goal: Will not experience complications related to bowel motility Outcome: Progressing Pt has successfully had a BM toay 2/3.  Lad BM was 6 days ago

## 2016-01-22 NOTE — Progress Notes (Signed)
Apply posterior splint to right foot and leg to hold ankle in right angle position.  Also applied orthowedge to use for only transfer to chair and BRPs.

## 2016-01-23 LAB — GLUCOSE, CAPILLARY
GLUCOSE-CAPILLARY: 132 mg/dL — AB (ref 65–99)
Glucose-Capillary: 104 mg/dL — ABNORMAL HIGH (ref 65–99)
Glucose-Capillary: 98 mg/dL (ref 65–99)
Glucose-Capillary: 100 mg/dL — ABNORMAL HIGH (ref 65–99)

## 2016-01-23 LAB — CBC
HCT: 36.2 % — ABNORMAL LOW (ref 40.0–52.0)
HEMOGLOBIN: 12.2 g/dL — AB (ref 13.0–18.0)
MCH: 28.1 pg (ref 26.0–34.0)
MCHC: 33.7 g/dL (ref 32.0–36.0)
MCV: 83.6 fL (ref 80.0–100.0)
PLATELETS: 76 10*3/uL — AB (ref 150–440)
RBC: 4.33 MIL/uL — AB (ref 4.40–5.90)
RDW: 15 % — ABNORMAL HIGH (ref 11.5–14.5)
WBC: 2.8 10*3/uL — AB (ref 3.8–10.6)

## 2016-01-23 LAB — HEPARIN INDUCED PLATELET AB (HIT ANTIBODY): HEPARIN INDUCED PLT AB: 0.45 {OD_unit} — AB (ref 0.000–0.400)

## 2016-01-23 NOTE — Progress Notes (Signed)
Patient ID: Dennis Fitzgerald, male   DOB: [birth year], 60 y.o.   MRN: [id number] [facility] PROGRESS NOTE  Toby Breithaupt WUJ:[id number] DOB: 01-Feb-1956 DOA: 01/14/2016 PCP: Leanna Sato, MD  HPI/Subjective: Patient feels well and offers no complaints. He has wound VAC on his right foot.  Objective: Filed Vitals:   01/23/16 0449 01/23/16 1300  BP: 136/65 135/69  Pulse: 67 70  Temp: 97.5 F (36.4 C) 97.6 F (36.4 C)  Resp: 20 19    Filed Weights   01/14/16 1015 01/20/16 0853 01/21/16 0845  Weight: 113.399 kg (250 lb) 113.399 kg (250 lb) 113.399 kg (250 lb)    ROS: Review of Systems  Constitutional: Negative for fever and chills.  Eyes: Negative for blurred vision.  Respiratory: Negative for cough and shortness of breath.   Cardiovascular: Negative for chest pain.  Gastrointestinal: Positive for constipation. Negative for nausea, vomiting, abdominal pain and diarrhea.  Genitourinary: Negative for dysuria.  Musculoskeletal: Negative for joint pain.  Neurological: Negative for dizziness and headaches.   Exam: Physical Exam  Constitutional: He is oriented to person, place, and time.  HENT:  Nose: No mucosal edema.  Mouth/Throat: No oropharyngeal exudate or posterior oropharyngeal edema.  Eyes: Conjunctivae, EOM and lids are normal. Pupils are equal, round, and reactive to light.  Neck: No JVD present. Carotid bruit is not present. No edema present. No thyroid mass and no thyromegaly present.  Cardiovascular: S1 normal and S2 normal.  Exam reveals no gallop.   No murmur heard. Pulses:      Dorsalis pedis pulses are 2+ on the right side, and 2+ on the left side.  Respiratory: No respiratory distress. He has no wheezes. He has no rhonchi. He has no rales.  GI: Soft. Bowel sounds are normal. There is no tenderness.  Musculoskeletal:       Right ankle: He exhibits swelling.       Left ankle: He exhibits swelling.  Lymphadenopathy:    He has no cervical adenopathy.   Neurological: He is alert and oriented to person, place, and time. No cranial nerve deficit.  Skin: Skin is warm. No rash noted. Nails show no clubbing.  Psychiatric: He has a normal mood and affect.    Data Reviewed: Basic Metabolic Panel:  Recent Labs Lab 01/19/16 0552 01/21/16 0535  CREATININE 0.76 0.66   CBC:  Recent Labs Lab 01/19/16 0552 01/22/16 0537 01/23/16 0500  WBC 4.7 4.6 2.8*  NEUTROABS 3.5  --   --   HGB 12.7* 12.7* 12.2*  HCT 37.8* 38.2* 36.2*  MCV 83.9 81.7 83.6  PLT 180 92* 76*    CBG:  Recent Labs Lab 01/22/16 1729 01/22/16 2127 01/22/16 2229 01/23/16 0739 01/23/16 1155  GLUCAP 89 47* 98 104* 98    Recent Results (from the past 240 hour(s))  Wound culture     Status: None   Collection Time: 01/14/16  7:16 PM  Result Value Ref Range Status   Specimen Description FOOT  Final   Special Requests NONE  Final   Gram Stain   Final    FEW WBC SEEN RARE GRAM POSITIVE COCCI IN PAIRS FEW GRAM VARIABLE ROD    Culture LIGHT GROWTH ENTEROBACTER CLOACAE  Final   Report Status 01/18/2016 FINAL  Final   Organism ID, Bacteria ENTEROBACTER CLOACAE  Final      Susceptibility   Enterobacter cloacae - MIC*    PIP/TAZO Value in next row Sensitive      SENSITIVE<=4    CEFTAZIDIME  Value in next row Sensitive      SENSITIVE<=1    CEFTRIAXONE Value in next row Sensitive      SENSITIVE<=1    IMIPENEM Value in next row Sensitive      SENSITIVE<=0.25    GENTAMICIN Value in next row Sensitive      SENSITIVE<=1    CIPROFLOXACIN Value in next row Sensitive      SENSITIVE<=0.25    TRIMETH/SULFA Value in next row Sensitive      SENSITIVE<=20    CEFEPIME Value in next row Sensitive      SENSITIVE<=1    * LIGHT GROWTH ENTEROBACTER CLOACAE  MRSA PCR Screening     Status: None   Collection Time: 01/15/16 11:40 PM  Result Value Ref Range Status   MRSA by PCR NEGATIVE NEGATIVE Final    Comment:        The GeneXpert MRSA Assay (FDA approved for NASAL  specimens only), is one component of a comprehensive MRSA colonization surveillance program. It is not intended to diagnose MRSA infection nor to guide or monitor treatment for MRSA infections.   Anaerobic culture     Status: None   Collection Time: 01/16/16 10:22 AM  Result Value Ref Range Status   Specimen Description WOUND  Final   Special Requests NONE  Final   Culture NO ANAEROBES ISOLATED  Final   Report Status 01/20/2016 FINAL  Final  Wound culture     Status: None   Collection Time: 01/16/16 10:22 AM  Result Value Ref Range Status   Specimen Description WOUND  Final   Special Requests NONE  Final   Gram Stain RARE WBC SEEN NO ORGANISMS SEEN   Final   Culture LIGHT GROWTH ENTEROBACTER CLOACAE  Final   Report Status 01/19/2016 FINAL  Final   Organism ID, Bacteria ENTEROBACTER CLOACAE  Final      Susceptibility   Enterobacter cloacae - MIC*    PIP/TAZO Value in next row Sensitive      SENSITIVE<=4    CEFTAZIDIME Value in next row Sensitive      SENSITIVE<=1    CEFTRIAXONE Value in next row Sensitive      SENSITIVE<=1    CEFEPIME Value in next row Sensitive      SENSITIVE<=1    IMIPENEM Value in next row Sensitive      SENSITIVE<=0.25    GENTAMICIN Value in next row Sensitive      SENSITIVE<=1    CIPROFLOXACIN Value in next row Sensitive      SENSITIVE<=0.25    TRIMETH/SULFA Value in next row Sensitive      SENSITIVE<=20    * LIGHT GROWTH ENTEROBACTER CLOACAE     Scheduled Meds: . atorvastatin  40 mg Oral QPM  . bisacodyl  10 mg Rectal Once  . cefTRIAXone (ROCEPHIN)  IV  2 g Intravenous Q24H  . ciprofloxacin  500 mg Oral BID  . enoxaparin (LOVENOX) injection  40 mg Subcutaneous Q24H  . furosemide  40 mg Oral BID  . insulin aspart  0-20 Units Subcutaneous TID WC  . insulin aspart  0-5 Units Subcutaneous QHS  . insulin aspart protamine- aspart  20 Units Subcutaneous Q supper  . insulin aspart protamine- aspart  30 Units Subcutaneous Q breakfast  .  lactulose  30 g Oral BID  . linagliptin  5 mg Oral QPM  . lisinopril  10 mg Oral QPM  . potassium chloride  10 mEq Oral QPM  . salmeterol  1 puff Inhalation BID  .  senna  1 tablet Oral Daily  . tiotropium  18 mcg Inhalation Daily   Assessment/Plan:  1. Diabetic foot ulcer right lower extremity with osteomyelitis.Patient is status post debridement with partial removal of infected fifth metatarsal.Patient has a wound VAC on. Appreciate infectious disease consultation recommended IV ceftriaxone and oral Cipro for 4 weeks. 2. Peripheral vascular disease. Status post angiogram 01/19/2016 with a PTCA to the right superficial femoral artery. 3. Type 2 diabetes with good control.continue the patient's regimen 4. History of COPD. Continue inhalers 5. Hyperlipidemia unspecified on atorvastatin 6. Thrombocytopenia. Heparin induced antibody slightly high.  Stop lovenox. Spoke with Pharmacy to send off specialty test  Code Status:     Code Status Orders        Start     Ordered   01/14/16 1638  Full code   Continuous     01/14/16 1637    Code Status History    Date Active Date Inactive Code Status Order ID Comments User Context   01/14/2016  3:14 PM 01/14/2016  4:37 PM Partial Code [id number]  Adrian Saran, MD ED     Disposition Plan: to Rehab soon  Consultants:  podiatry  Antibiotics:  Rocephin  cipro  Time spent:  Alford Highland  [facility]

## 2016-01-23 NOTE — Progress Notes (Signed)
Physical Therapy Re-Evaluation Patient Details Name: Dennis Fitzgerald MRN: [id number] DOB: 1956-06-02 Today's Date: 01/23/2016    History of Present Illness Pt is a 59 y.o. male presenting to hospital with diabetic R foot ulcer.  MRI showing osteomyelitis 5th metatarsal base and possibly the cuboid.  Pt s/p 01/16/16 debridement and partial removal infected 5th metatarsal R foot; s/p R LE angio 01/20/16; and s/p debridement 01/21/16.  PMH includes diabetes, chronic lymphadema, L fem bypass for PAD.    PT Comments    Patient is now 2 days s/p debridement and per latest order put in by Dr. Orland Jarred he is able to use orthowedge shoe for "as little weight bearing as possible" with RW for transfers and bathroom use. Patient has difficulty maintaining minimal WBing status in sit to stand transfer even with significant cuing from PT and assistance physically from PT. He is able to hop/pivot with RW to transfer from bed to chair, however given the weight bearing restrictions further mobility is not feasible at this time given his UE weakness. Patient would continue to benefit from skilled PT services to increase his tolerance for mobility.  Follow Up Recommendations  SNF     Equipment Recommendations       Recommendations for Other Services       Precautions / Restrictions Precautions Precautions: Fall Precaution Comments: Per latest consult from podiatry patient can use as little weight as possible on RLE for bathroom priveledges or bedside commode. Wound vac on R foot.   Restrictions Weight Bearing Restrictions: Yes Other Position/Activity Restrictions: "As little weight as possible on RLE" with orthowedge shoe and RW.     Mobility  Bed Mobility Overal bed mobility: Modified Independent Bed Mobility: Supine to Sit     Supine to sit: Modified independent (Device/Increase time);HOB elevated     General bed mobility comments: No deficits in speed of transfer or balance.   Transfers Overall  transfer level: Needs assistance Equipment used: Rolling walker (2 wheeled) Transfers: Sit to/from Stand Sit to Stand: Min assist;Min guard         General transfer comment: Pt with difficulty maintaining RLE with minimal WBing status with transfer, orthowedge donned. No loss of balance in transfer.  Ambulation/Gait   Ambulation Distance (Feet): 3 Feet Assistive device: Rolling walker (2 wheeled)     Gait velocity interpretation: Below normal speed for age/gender General Gait Details: Patient encouraged to have minimal WBing on RLE, really only for touchdown balance with orthowedge shoe, otherwise hopping only was performed, mild difficulty with maintaining WBing status during ambulation. Appropriate use of UEs.   Stairs            Wheelchair Mobility    Modified Rankin (Stroke Patients Only)       Balance Overall balance assessment: Needs assistance Sitting-balance support: No upper extremity supported;Feet supported Sitting balance-Leahy Scale: Good     Standing balance support: Bilateral upper extremity supported Standing balance-Leahy Scale: Fair                      Cognition Arousal/Alertness: Awake/alert Behavior During Therapy: Impulsive Overall Cognitive Status: Within Functional Limits for tasks assessed                      Exercises General Exercises - Lower Extremity Long Arc Quad: AROM;Both;10 reps Heel Slides: AROM;Both;10 reps Straight Leg Raises: AROM;Both;10 reps Hip Flexion/Marching: AROM;Both;10 reps;Seated    General Comments General comments (skin integrity, edema, etc.): R foot wound  vac in place with moderate bloody/sanguinous drainage.       Pertinent Vitals/Pain Pain Assessment: Faces Faces Pain Scale: Hurts little more Pain Location: R foot  Pain Descriptors / Indicators: Aching Pain Intervention(s): Limited activity within patient's tolerance;Monitored during session;Repositioned    Home Living                       Prior Function            PT Goals (current goals can now be found in the care plan section) Acute Rehab PT Goals Patient Stated Goal: To get out of the hospital.  PT Goal Formulation: With patient Time For Goal Achievement: 02/06/16 Potential to Achieve Goals: Good Progress towards PT goals: Progressing toward goals    Frequency  Min 2X/week    PT Plan Current plan remains appropriate    Co-evaluation             End of Session Equipment Utilized During Treatment: Gait belt (Orthowedge shoe) Activity Tolerance: Patient tolerated treatment well Patient left: in chair;with call bell/phone within reach;with chair alarm set;with family/visitor present     Time: 1416-1436 PT Time Calculation (min) (ACUTE ONLY): 20 min  Charges:                       G Codes:      Kerin Ransom, PT, DPT    01/23/2016, 4:06 PM

## 2016-01-23 NOTE — Progress Notes (Signed)
Daily Progress Note   Subjective  - 2 Days Post-Op  Pt s/p right foot debridment for osteomyelitis.  Wound vac in place.  Objective Filed Vitals:   01/22/16 0359 01/22/16 2123 01/23/16 0023 01/23/16 0449  BP: 123/67 132/69 136/73 136/65  Pulse: 79 71 77 67  Temp: 98.2 F (36.8 C) 97.8 F (36.6 C) 98.2 F (36.8 C) 97.5 F (36.4 C)  TempSrc: Oral Oral Oral Oral  Resp: Height:      Weight:      SpO2: 92% 94% 93% 90%    Physical Exam: Vac changed.  Wound stable.  No pus.  No foul odor.  Vancomycin beads in place.  Laboratory CBC    Component Value Date/Time   WBC 2.8* 01/23/2016 0500   HGB 12.2* 01/23/2016 0500   HCT 36.2* 01/23/2016 0500   PLT 76* 01/23/2016 0500    BMET    Component Value Date/Time   NA 133* 01/16/2016 0406   K 3.5 01/16/2016 0406   CL 99* 01/16/2016 0406   CO2 29 01/16/2016 0406   GLUCOSE 97 01/16/2016 0406   BUN 6 01/16/2016 0406   CREATININE 0.66 01/21/2016 0535   CALCIUM 8.2* 01/16/2016 0406   GFRNONAA >60 01/21/2016 0535   GFRAA >60 01/21/2016 0535    Assessment/Planning:O  Osteomyelitis right foot.   Wound stable.  C/W wound vac. Change 3x's per week.    C/W NWB and maintain splint at all times.  Pt awaiting placement.  F/U out pt with Dr. Orland Jarred in 1-2 weeks.    Gwyneth Revels A  01/23/2016, 7:26 AM

## 2016-01-24 LAB — CBC
HEMATOCRIT: 35.8 % — AB (ref 40.0–52.0)
Hemoglobin: 11.9 g/dL — ABNORMAL LOW (ref 13.0–18.0)
MCH: 27.8 pg (ref 26.0–34.0)
MCHC: 33.3 g/dL (ref 32.0–36.0)
MCV: 83.5 fL (ref 80.0–100.0)
PLATELETS: 96 10*3/uL — AB (ref 150–440)
RBC: 4.29 MIL/uL — ABNORMAL LOW (ref 4.40–5.90)
RDW: 15.5 % — AB (ref 11.5–14.5)
WBC: 3.1 10*3/uL — AB (ref 3.8–10.6)

## 2016-01-24 LAB — GLUCOSE, CAPILLARY
GLUCOSE-CAPILLARY: 180 mg/dL — AB (ref 65–99)
GLUCOSE-CAPILLARY: 129 mg/dL — AB (ref 65–99)
GLUCOSE-CAPILLARY: 175 mg/dL — AB (ref 65–99)
Glucose-Capillary: 116 mg/dL — ABNORMAL HIGH (ref 65–99)

## 2016-01-24 NOTE — Progress Notes (Signed)
Patient ID: Dennis Fitzgerald, male   DOB: [birth year], 60 y.o.   MRN: [id number] [facility] PROGRESS NOTE  Quaran Dennis Fitzgerald:[id number] DOB: 09-28-1956 DOA: 01/14/2016 PCP: Leanna Sato, MD  HPI/Subjective: Patient feels well and offers no complaints. He has wound VAC on his right foot.  Objective: Filed Vitals:   01/23/16 1713 01/23/16 2202  BP: 137/79 138/75  Pulse:  69  Temp:  97.5 F (36.4 C)  Resp:  18    Filed Weights   01/14/16 1015 01/20/16 0853 01/21/16 0845  Weight: 113.399 kg (250 lb) 113.399 kg (250 lb) 113.399 kg (250 lb)    ROS: Review of Systems  Constitutional: Negative for fever and chills.  Eyes: Negative for blurred vision.  Respiratory: Negative for cough and shortness of breath.   Cardiovascular: Negative for chest pain.  Gastrointestinal: Positive for constipation. Negative for nausea, vomiting, abdominal pain and diarrhea.  Genitourinary: Negative for dysuria.  Musculoskeletal: Negative for joint pain.  Neurological: Negative for dizziness and headaches.   Exam: Physical Exam  Constitutional: He is oriented to person, place, and time.  HENT:  Nose: No mucosal edema.  Mouth/Throat: No oropharyngeal exudate or posterior oropharyngeal edema.  Eyes: Conjunctivae, EOM and lids are normal. Pupils are equal, round, and reactive to light.  Neck: No JVD present. Carotid bruit is not present. No edema present. No thyroid mass and no thyromegaly present.  Cardiovascular: S1 normal and S2 normal.  Exam reveals no gallop.   No murmur heard. Pulses:      Dorsalis pedis pulses are 2+ on the right side, and 2+ on the left side.  Respiratory: No respiratory distress. He has no wheezes. He has no rhonchi. He has no rales.  GI: Soft. Bowel sounds are normal. There is no tenderness.  Musculoskeletal:       Right ankle: He exhibits swelling.       Left ankle: He exhibits swelling.  Lymphadenopathy:    He has no cervical adenopathy.  Neurological: He is  alert and oriented to person, place, and time. No cranial nerve deficit.  Skin: Skin is warm. Nails show no clubbing.  Petechial rash on arms with some bruising.  Psychiatric: He has a normal mood and affect.    Data Reviewed: Basic Metabolic Panel:  Recent Labs Lab 01/19/16 0552 01/21/16 0535  CREATININE 0.76 0.66   CBC:  Recent Labs Lab 01/19/16 0552 01/22/16 0537 01/23/16 0500 01/24/16 0533  WBC 4.7 4.6 2.8* 3.1*  NEUTROABS 3.5  --   --   --   HGB 12.7* 12.7* 12.2* 11.9*  HCT 37.8* 38.2* 36.2* 35.8*  MCV 83.9 81.7 83.6 83.5  PLT 180 92* 76* 96*    CBG:  Recent Labs Lab 01/23/16 1155 01/23/16 1659 01/23/16 2201 01/24/16 0738 01/24/16 1152  GLUCAP 98 132* 100* 180* 129*    Recent Results (from the past 240 hour(s))  Wound culture     Status: None   Collection Time: 01/14/16  7:16 PM  Result Value Ref Range Status   Specimen Description FOOT  Final   Special Requests NONE  Final   Gram Stain   Final    FEW WBC SEEN RARE GRAM POSITIVE COCCI IN PAIRS FEW GRAM VARIABLE ROD    Culture LIGHT GROWTH ENTEROBACTER CLOACAE  Final   Report Status 01/18/2016 FINAL  Final   Organism ID, Bacteria ENTEROBACTER CLOACAE  Final      Susceptibility   Enterobacter cloacae - MIC*    PIP/TAZO Value in next  row Sensitive      SENSITIVE<=4    CEFTAZIDIME Value in next row Sensitive      SENSITIVE<=1    CEFTRIAXONE Value in next row Sensitive      SENSITIVE<=1    IMIPENEM Value in next row Sensitive      SENSITIVE<=0.25    GENTAMICIN Value in next row Sensitive      SENSITIVE<=1    CIPROFLOXACIN Value in next row Sensitive      SENSITIVE<=0.25    TRIMETH/SULFA Value in next row Sensitive      SENSITIVE<=20    CEFEPIME Value in next row Sensitive      SENSITIVE<=1    * LIGHT GROWTH ENTEROBACTER CLOACAE  MRSA PCR Screening     Status: None   Collection Time: 01/15/16 11:40 PM  Result Value Ref Range Status   MRSA by PCR NEGATIVE NEGATIVE Final    Comment:         The GeneXpert MRSA Assay (FDA approved for NASAL specimens only), is one component of a comprehensive MRSA colonization surveillance program. It is not intended to diagnose MRSA infection nor to guide or monitor treatment for MRSA infections.   Anaerobic culture     Status: None   Collection Time: 01/16/16 10:22 AM  Result Value Ref Range Status   Specimen Description WOUND  Final   Special Requests NONE  Final   Culture NO ANAEROBES ISOLATED  Final   Report Status 01/20/2016 FINAL  Final  Wound culture     Status: None   Collection Time: 01/16/16 10:22 AM  Result Value Ref Range Status   Specimen Description WOUND  Final   Special Requests NONE  Final   Gram Stain RARE WBC SEEN NO ORGANISMS SEEN   Final   Culture LIGHT GROWTH ENTEROBACTER CLOACAE  Final   Report Status 01/19/2016 FINAL  Final   Organism ID, Bacteria ENTEROBACTER CLOACAE  Final      Susceptibility   Enterobacter cloacae - MIC*    PIP/TAZO Value in next row Sensitive      SENSITIVE<=4    CEFTAZIDIME Value in next row Sensitive      SENSITIVE<=1    CEFTRIAXONE Value in next row Sensitive      SENSITIVE<=1    CEFEPIME Value in next row Sensitive      SENSITIVE<=1    IMIPENEM Value in next row Sensitive      SENSITIVE<=0.25    GENTAMICIN Value in next row Sensitive      SENSITIVE<=1    CIPROFLOXACIN Value in next row Sensitive      SENSITIVE<=0.25    TRIMETH/SULFA Value in next row Sensitive      SENSITIVE<=20    * LIGHT GROWTH ENTEROBACTER CLOACAE     Scheduled Meds: . atorvastatin  40 mg Oral QPM  . bisacodyl  10 mg Rectal Once  . cefTRIAXone (ROCEPHIN)  IV  2 g Intravenous Q24H  . ciprofloxacin  500 mg Oral BID  . furosemide  40 mg Oral BID  . insulin aspart  0-20 Units Subcutaneous TID WC  . insulin aspart  0-5 Units Subcutaneous QHS  . insulin aspart protamine- aspart  20 Units Subcutaneous Q supper  . insulin aspart protamine- aspart  30 Units Subcutaneous Q breakfast  . lactulose  30 g  Oral BID  . linagliptin  5 mg Oral QPM  . lisinopril  10 mg Oral QPM  . potassium chloride  10 mEq Oral QPM  . salmeterol  1 puff  Inhalation BID  . senna  1 tablet Oral Daily  . tiotropium  18 mcg Inhalation Daily   Assessment/Plan:  1. Diabetic foot ulcer right lower extremity with osteomyelitis.Patient is status post debridement with partial removal of infected fifth metatarsal.Patient has a wound VAC on. Appreciate infectious disease consultation recommended IV ceftriaxone and oral Cipro for 4 weeks. 2. Peripheral vascular disease. Status post angiogram 01/19/2016 with a PTCA to the right superficial femoral artery. 3. Type 2 diabetes with good control.continue the patient's regimen 4. History of COPD. Continue inhalers 5. Hyperlipidemia unspecified on atorvastatin 6. Thrombocytopenia. Heparin induced antibody slightly high.  Stopped lovenox. Spoke with Pharmacy to send off specialty test. Platelet count slightly up today to 96,000.  Code Status:     Code Status Orders        Start     Ordered   01/14/16 1638  Full code   Continuous     01/14/16 1637    Code Status History    Date Active Date Inactive Code Status Order ID Comments User Context   01/14/2016  3:14 PM 01/14/2016  4:37 PM Partial Code [id number]  Adrian Saran, MD ED     Disposition Plan: to Rehab soon  Consultants:  podiatry  Antibiotics:  Rocephin  cipro  Time spent: 25 minutes  Alford Highland  [facility] [place] [facility]

## 2016-01-25 LAB — GLUCOSE, CAPILLARY
GLUCOSE-CAPILLARY: 110 mg/dL — AB (ref 65–99)
GLUCOSE-CAPILLARY: 147 mg/dL — AB (ref 65–99)
GLUCOSE-CAPILLARY: 84 mg/dL (ref 65–99)
Glucose-Capillary: 132 mg/dL — ABNORMAL HIGH (ref 65–99)

## 2016-01-25 MED ORDER — AMMONIUM LACTATE 12 % EX LOTN
TOPICAL_LOTION | Freq: Two times a day (BID) | CUTANEOUS | Status: DC
  Administered 2016-01-25: 1 via TOPICAL
  Filled 2016-01-25: qty 400

## 2016-01-25 NOTE — Progress Notes (Signed)
[facility] PROGRESS NOTE Date of Admission:  01/14/2016     ID: Dennis Fitzgerald is a 60 y.o. male with diabetic foot infection and osteomyelitis  Active Problems:   Diabetic foot ulcer ([place])   Subjective: No pain, fevers  ROS  Eleven systems are reviewed and negative except per hpi  Medications:  Antibiotics Given (last 72 hours)    Date/Time Action Medication Dose Rate   01/22/16 1608 Given   cefTRIAXone (ROCEPHIN) 2 g in dextrose 5 % 50 mL IVPB 2 g 100 mL/hr   01/22/16 1608 Given   ciprofloxacin (CIPRO) tablet 500 mg 500 mg    01/22/16 1949 Given   ciprofloxacin (CIPRO) tablet 500 mg 500 mg    01/23/16 9169 Given   ciprofloxacin (CIPRO) tablet 500 mg 500 mg    01/23/16 1231 Given   cefTRIAXone (ROCEPHIN) 2 g in dextrose 5 % 50 mL IVPB 2 g 100 mL/hr   01/23/16 1956 Given   ciprofloxacin (CIPRO) tablet 500 mg 500 mg    01/24/16 0847 Given   ciprofloxacin (CIPRO) tablet 500 mg 500 mg    01/24/16 1238 Given   cefTRIAXone (ROCEPHIN) 2 g in dextrose 5 % 50 mL IVPB 2 g 100 mL/hr   01/24/16 1937 Given   ciprofloxacin (CIPRO) tablet 500 mg 500 mg    01/25/16 4503 Given   ciprofloxacin (CIPRO) tablet 500 mg 500 mg    01/25/16 1330 Given   cefTRIAXone (ROCEPHIN) 2 g in dextrose 5 % 50 mL IVPB 2 g 100 mL/hr     . ammonium lactate   Topical BID  . atorvastatin  40 mg Oral QPM  . bisacodyl  10 mg Rectal Once  . cefTRIAXone (ROCEPHIN)  IV  2 g Intravenous Q24H  . ciprofloxacin  500 mg Oral BID  . furosemide  40 mg Oral BID  . insulin aspart  0-20 Units Subcutaneous TID WC  . insulin aspart  0-5 Units Subcutaneous QHS  . insulin aspart protamine- aspart  20 Units Subcutaneous Q supper  . insulin aspart protamine- aspart  30 Units Subcutaneous Q breakfast  . lactulose  30 g Oral BID  . linagliptin  5 mg Oral QPM  . lisinopril  10 mg Oral QPM  . potassium chloride  10 mEq Oral QPM  . salmeterol  1 puff Inhalation BID  . senna  1 tablet Oral Daily  .  tiotropium  18 mcg Inhalation Daily    Objective: Vital signs in last 24 hours: Temp:  [97.5 F (36.4 C)-97.7 F (36.5 C)] 97.5 F (36.4 C) (02/06 1246) Pulse Rate:  [62-67] 62 (02/06 1246) Resp:  [16-18] 18 (02/06 1246) BP: (120-146)/(72-74) 146/74 mmHg (02/06 1246) SpO2:  [95 %-97 %] 95 % (02/06 1246) Constitutional: dishelved, obese No distress.  HENT: anicteric  Mouth/Throat: Oropharynx is clear and moist. No oropharyngeal exudate.  Cardiovascular: Normal rate, regular rhythm  Pulmonary/Chest: Effort normal and breath sounds normal. No respiratory distress. He has no wheezes.  Abdominal: Soft. Bowel sounds are normal. He exhibits no distension. There is no tenderness.  Lymphadenopathy: He has no cervical adenopathy.  Neurological: He is alert and oriented to person, place, and time.  Ext impressive bilateral LE lymphedema Skin: RLE wrapped post op Psychiatric: He has a normal mood and affect. His behavior is normal.   Lab Results  Recent Labs  01/23/16 0500 01/24/16 0533  WBC 2.8* 3.1*  HGB 12.2* 11.9*  HCT 36.2* 35.8*    Microbiology: Results for orders  placed or performed during the hospital encounter of 01/14/16  Wound culture     Status: None   Collection Time: 01/14/16  7:16 PM  Result Value Ref Range Status   Specimen Description FOOT  Final   Special Requests NONE  Final   Gram Stain   Final    FEW WBC SEEN RARE GRAM POSITIVE COCCI IN PAIRS FEW GRAM VARIABLE ROD    Culture LIGHT GROWTH ENTEROBACTER CLOACAE  Final   Report Status 01/18/2016 FINAL  Final   Organism ID, Bacteria ENTEROBACTER CLOACAE  Final      Susceptibility   Enterobacter cloacae - MIC*    PIP/TAZO Value in next row Sensitive      SENSITIVE<=4    CEFTAZIDIME Value in next row Sensitive      SENSITIVE<=1    CEFTRIAXONE Value in next row Sensitive      SENSITIVE<=1    IMIPENEM Value in next row Sensitive      SENSITIVE<=0.25    GENTAMICIN Value in next row Sensitive       SENSITIVE<=1    CIPROFLOXACIN Value in next row Sensitive      SENSITIVE<=0.25    TRIMETH/SULFA Value in next row Sensitive      SENSITIVE<=20    CEFEPIME Value in next row Sensitive      SENSITIVE<=1    * LIGHT GROWTH ENTEROBACTER CLOACAE  MRSA PCR Screening     Status: None   Collection Time: 01/15/16 11:40 PM  Result Value Ref Range Status   MRSA by PCR NEGATIVE NEGATIVE Final    Comment:        The GeneXpert MRSA Assay (FDA approved for NASAL specimens only), is one component of a comprehensive MRSA colonization surveillance program. It is not intended to diagnose MRSA infection nor to guide or monitor treatment for MRSA infections.   Anaerobic culture     Status: None   Collection Time: 01/16/16 10:22 AM  Result Value Ref Range Status   Specimen Description WOUND  Final   Special Requests NONE  Final   Culture NO ANAEROBES ISOLATED  Final   Report Status 01/20/2016 FINAL  Final  Wound culture     Status: None   Collection Time: 01/16/16 10:22 AM  Result Value Ref Range Status   Specimen Description WOUND  Final   Special Requests NONE  Final   Gram Stain RARE WBC SEEN NO ORGANISMS SEEN   Final   Culture LIGHT GROWTH ENTEROBACTER CLOACAE  Final   Report Status 01/19/2016 FINAL  Final   Organism ID, Bacteria ENTEROBACTER CLOACAE  Final      Susceptibility   Enterobacter cloacae - MIC*    PIP/TAZO Value in next row Sensitive      SENSITIVE<=4    CEFTAZIDIME Value in next row Sensitive      SENSITIVE<=1    CEFTRIAXONE Value in next row Sensitive      SENSITIVE<=1    CEFEPIME Value in next row Sensitive      SENSITIVE<=1    IMIPENEM Value in next row Sensitive      SENSITIVE<=0.25    GENTAMICIN Value in next row Sensitive      SENSITIVE<=1    CIPROFLOXACIN Value in next row Sensitive      SENSITIVE<=0.25    TRIMETH/SULFA Value in next row Sensitive      SENSITIVE<=20    * LIGHT GROWTH ENTEROBACTER CLOACAE    Studies/Results: Mr Foot Right W Wo  Contrast  01/16/2016  CLINICAL DATA:  Foot ulcer near the heel, starting in December 2016, worsening recently. Diabetes. EXAM: MRI OF THE RIGHT HINDFOOT WITHOUT AND WITH CONTRAST TECHNIQUE: Multiplanar, multisequence MR imaging was performed both before and after administration of intravenous contrast. CONTRAST:  2m MULTIHANCE GADOBENATE DIMEGLUMINE 529 MG/ML IV SOLN COMPARISON:  01/14/2016 radiograph FINDINGS: Diffuse subcutaneous edema in the distal calf and ankle noted. There is an ulceration adjacent to the base of fifth metatarsal. Underlying this ulceration there is a 7.6 by 3.9 by 2.0 cm volume of hypoenhancing and likely necrotic tissue. This extends down to the base of the fifth metatarsal which demonstrates diffuse abnormal edema and enhancement compatible with osteomyelitis of the fifth metatarsal. Edema and enhancement is most severe at the metatarsal base but extends in the metatarsal shaft towards the metatarsal head. The peroneus brevis appears to still inserts on the edematous base of the fifth metatarsal in the peroneus longus skirts the margin of the necrotic tissue. There is some tissue necrosis and abnormal edema and enhancement in the abductor digiti minimi muscle as it passes below the fifth metatarsal. The area of soft tissue necrosis abuts the cuboid and there is some faintly increased T2 signal and enhancement in this adjacent portion of the cuboid near the peroneus longus tendon which could reflect an early osteomyelitis for example images 11 through 14 of series 15. There is also abnormal edema and enhancement in the bony articular confluence of the navicular, middle and medial cuneiform, and cuboid as shown on images 16 through 23 of series 11. Of these the most severely involved is the navicular. I am uncertain whether this edema and enhancement is due to early osteomyelitis or underlying arthropathy. Lisfranc ligament intact. There is peroneus longus and peroneus brevis  tenosynovitis. There is 5 mm non-fragmented osteochondral lesion of the lateral talar dome on image 22 series 14. Cellulitis noted in the foot especially severe in the vicinity of the ulceration. Please note that the region of the metatarsal heads and forefoot was not included on today's exam. IMPRESSION: 1. 7.6 by 3.9 by 2.0 cm volume of hypoenhancing necrotic tissue underlying the ulceration near the base of the fifth metatarsal osteomyelitis of the base of the fifth metatarsal extending to the margin of the fifth metatarsal. Diffuse osteomyelitis in the fifth metatarsal especially severe at its base. Possible early osteomyelitis involving the inferior-lateral cuboid, and also in the navicular. Questionable early osteomyelitis in the middle and medial cuneiforms. 2. Diffuse infiltrative edema in the calf and ankle compatible with cellulitis. 3. Incidental 5 mm non-fragmented osteochondral lesion of the lateral talar dome. Electronically Signed   By: WVan ClinesM.D.   On: 01/16/2016 08:14   Dg Chest Port 1 View  01/19/2016  CLINICAL DATA:  PICC placement.  COPD. EXAM: PORTABLE CHEST 1 VIEW COMPARISON:  CT 02/11/2014 FINDINGS: Tip of the right upper extremity PICC in the mid-distal SVC. No pneumothorax. Chronic elevation of right hemidiaphragm. Mild increase in atelectasis at the right lung base. Cardiomediastinal contours are unchanged. Questionable vascular congestion versus technique. No pleural effusion. Questionable focus of air in the soft tissue of the right axilla. IMPRESSION: 1. Tip of the right upper extremity PICC in the mid distal SVC. 2. Chronic elevation of right hemidiaphragm with mild increase in right basilar atelectasis. 3. Questionable vascular congestion. 4. Probable small focus of air in the soft tissues in the right axilla. This may be procedure related related, however recommend correlation to exclude soft tissue infection. Electronically Signed   By: MJeb Levering  M.D.   On:  01/19/2016 21:27   Dg Foot Complete Right  01/14/2016  CLINICAL DATA:  History of poorly controlled diabetes, chronic lymphedema, dorsal foot ulcer for 7 weeks. Currently on antibiotics but with persistent symptoms. EXAM: RIGHT FOOT COMPLETE - 3+ VIEW COMPARISON:  Right foot plain film dated 01/12/2016 and right foot plain film dated 12/28/2015. FINDINGS: Again noted is an apparent soft tissue defect overlying the right fifth metatarsal base. Compared to earlier plain film from 12/28/2015, there appear to be developing demineralization within the fifth metatarsal base and developing defects along the lateral cortex margin of the fifth metatarsal base, highly suspicious for developing osteomyelitis. Remainder of the osseous structures of the right foot appear intact and normal in mineralization. No soft tissue gas seen. IMPRESSION: 1. Presumed soft tissue defect/ulceration overlying the base of the right fifth metatarsal base. 2. Probable developing demineralization and cortex discontinuity along the lateral margin of the underlying fifth metatarsal base, highly suspicious for developing osteomyelitis. Would consider MRI for further characterization. These results were called by telephone at the time of interpretation on 01/14/2016 at 1:34 pm to Dr. Charlotte Crumb , who verbally acknowledged these results. Electronically Signed   By: Franki Cabot M.D.   On: 01/14/2016 13:36   Dg Foot Complete Right  01/12/2016  CLINICAL DATA:  Right foot pain and wound. EXAM: RIGHT FOOT COMPLETE - 3+ VIEW COMPARISON:  12/28/2015 FINDINGS: Soft tissue swelling in the forefoot especially distally. No bony destructive findings characteristic of osteomyelitis are identified. No obvious gas tracking in the soft tissues. Suspected ulceration adjacent to the base of the fifth metatarsal. Dorsal spurring of the talar head. IMPRESSION: 1. Continued soft tissue swelling in the forefoot. I do not see definite gas tracking in the soft  tissues and no bony destructive findings characteristic of osteomyelitis are identified. 2. Suspected ulceration adjacent to the base the fifth metatarsal. 3. If further imaging workup for osteomyelitis or abscess is identified, consider MRI. Electronically Signed   By: Van Clines M.D.   On: 01/12/2016 13:14   Dg Foot Complete Right  12/28/2015  CLINICAL DATA:  Nonhealing wound.  Diabetes. EXAM: RIGHT FOOT COMPLETE - 3+ VIEW COMPARISON:  None. FINDINGS: Soft tissue wound noted along the lateral aspect of the foot. No underlying foreign body . No acute bony or joint abnormality identified. No evidence of fracture or dislocation . IMPRESSION: Soft tissue wound noted along the lateral aspect of foot. No radiopaque foreign body. No acute bony abnormality. Diffuse degenerative change. Electronically Signed   By: Marcello Moores  Register   On: 12/28/2015 12:35    Assessment/Plan: Greysin Medlen is a 60 y.o. male DM, chronic lymphedema, tobacco abuse, PAD s/p bypass on L admitted with worsening of R LE foot wound. Had been following with wound center. At admit had wbc 13.6, wound cx with enterobacter (grm + cocci also noted on GS) .  MRI showed osteomyelitis 5th Metatarsal and also possible cuboid and navicular. ESR 40, CRP 9.6 1/28 underwent I and D and removal of prox 5th MT - abx beads placed and wound vac placed.  Bone cx with enterobacter . MRSA screen is negative S/p angio 2.1 and then repeat surgery 2/2  Recommendations Can cont IV ceftraixone and oral cipro while inpatient At DC can send on Oral cipro for 4 week course from surgery. Cipro is very bioavailable and will treat the enterobacter isolated.  I can see with podiatry in follow up and extend abx if needed.   Thank  you very much for the consult. Will follow with you.  [place], [place]   01/25/2016, 3:23 PM

## 2016-01-25 NOTE — Progress Notes (Signed)
CSW faxed extend bed search for patient.  No offers as of today.  Call to [facility] who does intake for several facilities.  States unable to take Medicaid patient with IV anti and wound vac.  CSW spoke with RN care manager who states patient will be on IV Anti 4 to 6 weeks and on the wound vac 2 to 3 month.  Call to [facility], will talk with administrator to see if they will reconsider taking patient.  Calls were made to follow up on several referral.  No Medicaid beds available at this time.  CSW will continue to follow patient for ongoing and disposition needs in obtaining a SNF bed.   Sammuel Hines. LCSWA Clinical Social Work Department [phone] 4:19 PM

## 2016-01-25 NOTE — Care Management (Signed)
CSW requested additional information on plan for wound vac, and duration it would be needed.  I spoke with Dr. Orland Jarred who states that the wound vac will be needed for 2-3 months.  Dr. Orland Jarred states that in his medical opinion it is in the best interest for him to be discharge to SNF.

## 2016-01-25 NOTE — Progress Notes (Signed)
Patient ID: Dennis Fitzgerald, male   DOB: 1956/05/20, 60 y.o.   MRN: [id number] [facility] PROGRESS NOTE  Dennis Fitzgerald WUJ:[id number] DOB: 05-12-1956 DOA: 01/14/2016 PCP: Dennis Sato, MD  HPI/Subjective: Patient feels well and offers no complaints. He has wound VAC on his right foot.  Objective: Filed Vitals:   01/24/16 2112 01/25/16 1246  BP: 140/72 146/74  Pulse: 67 62  Temp: 97.7 F (36.5 C) 97.5 F (36.4 C)  Resp: 16 18    Filed Weights   01/14/16 1015 01/20/16 0853 01/21/16 0845  Weight: 113.399 kg (250 lb) 113.399 kg (250 lb) 113.399 kg (250 lb)    ROS: Review of Systems  Constitutional: Negative for fever and chills.  Eyes: Negative for blurred vision.  Respiratory: Negative for cough and shortness of breath.   Cardiovascular: Negative for chest pain.  Gastrointestinal: Negative for nausea, vomiting, abdominal pain, diarrhea and constipation.  Genitourinary: Negative for dysuria.  Musculoskeletal: Negative for joint pain.  Neurological: Negative for dizziness and headaches.   Exam: Physical Exam  Constitutional: He is oriented to person, place, and time.  HENT:  Nose: No mucosal edema.  Mouth/Throat: No oropharyngeal exudate or posterior oropharyngeal edema.  Eyes: Conjunctivae, EOM and lids are normal. Pupils are equal, round, and reactive to light.  Neck: No JVD present. Carotid bruit is not present. No edema present. No thyroid mass and no thyromegaly present.  Cardiovascular: S1 normal and S2 normal.  Exam reveals no gallop.   No murmur heard. Pulses:      Dorsalis pedis pulses are 2+ on the right side, and 2+ on the left side.  Respiratory: No respiratory distress. He has no wheezes. He has no rhonchi. He has no rales.  GI: Soft. Bowel sounds are normal. There is no tenderness.  Musculoskeletal:       Right ankle: He exhibits swelling.       Left ankle: He exhibits swelling.  Lymphadenopathy:    He has no cervical adenopathy.  Neurological:  He is alert and oriented to person, place, and time. No cranial nerve deficit.  Skin: Skin is warm. Nails show no clubbing.  Petechial rash on arms with some bruising.  Psychiatric: He has a normal mood and affect.    Data Reviewed: Basic Metabolic Panel:  Recent Labs Lab 01/19/16 0552 01/21/16 0535  CREATININE 0.76 0.66   CBC:  Recent Labs Lab 01/19/16 0552 01/22/16 0537 01/23/16 0500 01/24/16 0533  WBC 4.7 4.6 2.8* 3.1*  NEUTROABS 3.5  --   --   --   HGB 12.7* 12.7* 12.2* 11.9*  HCT 37.8* 38.2* 36.2* 35.8*  MCV 83.9 81.7 83.6 83.5  PLT 180 92* 76* 96*    CBG:  Recent Labs Lab 01/24/16 1152 01/24/16 1641 01/24/16 2106 01/25/16 0801 01/25/16 1144  GLUCAP 129* 175* 116* 132* 110*    Recent Results (from the past 240 hour(s))  MRSA PCR Screening     Status: None   Collection Time: 01/15/16 11:40 PM  Result Value Ref Range Status   MRSA by PCR NEGATIVE NEGATIVE Final    Comment:        The GeneXpert MRSA Assay (FDA approved for NASAL specimens only), is one component of a comprehensive MRSA colonization surveillance program. It is not intended to diagnose MRSA infection nor to guide or monitor treatment for MRSA infections.   Anaerobic culture     Status: None   Collection Time: 01/16/16 10:22 AM  Result Value Ref Range Status   Specimen  Description WOUND  Final   Special Requests NONE  Final   Culture NO ANAEROBES ISOLATED  Final   Report Status 01/20/2016 FINAL  Final  Wound culture     Status: None   Collection Time: 01/16/16 10:22 AM  Result Value Ref Range Status   Specimen Description WOUND  Final   Special Requests NONE  Final   Gram Stain RARE WBC SEEN NO ORGANISMS SEEN   Final   Culture LIGHT GROWTH ENTEROBACTER CLOACAE  Final   Report Status 01/19/2016 FINAL  Final   Organism ID, Bacteria ENTEROBACTER CLOACAE  Final      Susceptibility   Enterobacter cloacae - MIC*    PIP/TAZO Value in next row Sensitive      SENSITIVE<=4     CEFTAZIDIME Value in next row Sensitive      SENSITIVE<=1    CEFTRIAXONE Value in next row Sensitive      SENSITIVE<=1    CEFEPIME Value in next row Sensitive      SENSITIVE<=1    IMIPENEM Value in next row Sensitive      SENSITIVE<=0.25    GENTAMICIN Value in next row Sensitive      SENSITIVE<=1    CIPROFLOXACIN Value in next row Sensitive      SENSITIVE<=0.25    TRIMETH/SULFA Value in next row Sensitive      SENSITIVE<=20    * LIGHT GROWTH ENTEROBACTER CLOACAE     Scheduled Meds: . ammonium lactate   Topical BID  . atorvastatin  40 mg Oral QPM  . bisacodyl  10 mg Rectal Once  . cefTRIAXone (ROCEPHIN)  IV  2 g Intravenous Q24H  . ciprofloxacin  500 mg Oral BID  . furosemide  40 mg Oral BID  . insulin aspart  0-20 Units Subcutaneous TID WC  . insulin aspart  0-5 Units Subcutaneous QHS  . insulin aspart protamine- aspart  20 Units Subcutaneous Q supper  . insulin aspart protamine- aspart  30 Units Subcutaneous Q breakfast  . lactulose  30 g Oral BID  . linagliptin  5 mg Oral QPM  . lisinopril  10 mg Oral QPM  . potassium chloride  10 mEq Oral QPM  . salmeterol  1 puff Inhalation BID  . senna  1 tablet Oral Daily  . tiotropium  18 mcg Inhalation Daily   Assessment/Plan:  1. Diabetic foot ulcer right lower extremity with osteomyelitis.Patient is status post debridement with partial removal of infected fifth metatarsal.  Patient has a wound VAC on.  Infectious disease specialist recommended IV Rocephin and by mouth Cipro while in the hospital and can be discharged on oral Cipro. Social worker having difficulty with wound VAC and IV antibiotics upon placement. Hopefully we can get the patient off IV antibiotics and on oral antibiotics upon discharge out to her rehabilitation. The patient will continue to need the wound VAC for 4 weeks. 2. Peripheral vascular disease. Status post angiogram 01/19/2016 with a PTCA to the right superficial femoral artery. 3. Type 2 diabetes with good  control.continue the patient's regimen 4. History of COPD. Continue inhalers 5. Hyperlipidemia unspecified on atorvastatin 6. Thrombocytopenia. Heparin induced antibody slightly high.  Stopped lovenox. Spoke with Pharmacy to send off specialty test. Platelet count slightly up yesterday to 96,000.  Code Status:     Code Status Orders        Start     Ordered   01/14/16 1638  Full code   Continuous     01/14/16 1637  Code Status History    Date Active Date Inactive Code Status Order ID Comments User Context   01/14/2016  3:14 PM 01/14/2016  4:37 PM Partial Code [id number]  Adrian Saran, MD ED     Disposition Plan: to Rehab and social worker able to obtain a bed.  Consultants:  podiatry  Antibiotics:  Rocephin  cipro  Time spent: 20 minutes  Alford Highland  [facility]

## 2016-01-26 LAB — BASIC METABOLIC PANEL
Anion gap: 5 (ref 5–15)
BUN: 7 mg/dL (ref 6–20)
CALCIUM: 8.3 mg/dL — AB (ref 8.9–10.3)
CO2: 32 mmol/L (ref 22–32)
CREATININE: 0.71 mg/dL (ref 0.61–1.24)
Chloride: 99 mmol/L — ABNORMAL LOW (ref 101–111)
GFR calc non Af Amer: >60 mL/min (ref >60)
Glucose, Bld: 107 mg/dL — ABNORMAL HIGH (ref 65–99)
Potassium: 3.7 mmol/L (ref 3.5–5.1)
SODIUM: 136 mmol/L (ref 135–145)

## 2016-01-26 LAB — CBC
HCT: 37.2 % — ABNORMAL LOW (ref 40.0–52.0)
HEMOGLOBIN: 12.6 g/dL — AB (ref 13.0–18.0)
MCH: 27.8 pg (ref 26.0–34.0)
MCHC: 33.9 g/dL (ref 32.0–36.0)
MCV: 82.1 fL (ref 80.0–100.0)
Platelets: 159 10*3/uL (ref 150–440)
RBC: 4.53 MIL/uL (ref 4.40–5.90)
RDW: 15.2 % — AB (ref 11.5–14.5)
WBC: 5.1 10*3/uL (ref 3.8–10.6)

## 2016-01-26 LAB — GLUCOSE, CAPILLARY
GLUCOSE-CAPILLARY: 143 mg/dL — AB (ref 65–99)
Glucose-Capillary: 125 mg/dL — ABNORMAL HIGH (ref 65–99)

## 2016-01-26 LAB — SEROTONIN RELEASE ASSAY (SRA): SRA .2 IU/mL UFH Ser-aCnc: <1 % (ref 0–20)

## 2016-01-26 MED ORDER — CIPROFLOXACIN HCL 500 MG PO TABS: ORAL_TABLET | ORAL | Status: DC

## 2016-01-26 MED ORDER — SENNA 8.6 MG PO TABS: 1.0000 | ORAL_TABLET | Freq: Every day | ORAL | Status: AC

## 2016-01-26 MED ORDER — FONDAPARINUX SODIUM 2.5 MG/0.5ML ~~LOC~~ SOLN
2.5000 mg | Freq: Every day | SUBCUTANEOUS | Status: DC
  Filled 2016-01-26 (×2): qty 0.5

## 2016-01-26 MED ORDER — OXYCODONE HCL 5 MG PO TABS: 5.0000 mg | ORAL_TABLET | ORAL | Status: DC | PRN

## 2016-01-26 MED ORDER — INSULIN ASPART 100 UNIT/ML ~~LOC~~ SOLN: 5.0000 [IU] | Freq: Three times a day (TID) | SUBCUTANEOUS | Status: DC

## 2016-01-26 MED ORDER — INSULIN ASPART PROT & ASPART (70-30 MIX) 100 UNIT/ML ~~LOC~~ SUSP: 20.0000 [IU] | Freq: Every day | SUBCUTANEOUS | Status: DC

## 2016-01-26 MED ORDER — AMMONIUM LACTATE 12 % EX LOTN: TOPICAL_LOTION | Freq: Two times a day (BID) | CUTANEOUS | Status: AC

## 2016-01-26 MED ORDER — FONDAPARINUX SODIUM 2.5 MG/0.5ML ~~LOC~~ SOLN: SUBCUTANEOUS | Status: AC

## 2016-01-26 MED ORDER — MAGIC MOUTHWASH: 10.0000 mL | Freq: Three times a day (TID) | ORAL | Status: DC | PRN

## 2016-01-26 NOTE — Care Management (Signed)
Patient to discharge today to facility.  CSW facilitating discharge.  I call Debbie at [facility] to notify her that the patient would not require their services at this time.  RNCM signing off

## 2016-01-26 NOTE — Clinical Social Work Note (Signed)
MD is going to convert patient to oral ABX and place on oral cipro for discharge. Patient will still require wound vac. CSW is following up with sending patient's referral out for reconsideration. York Spaniel MSW,LCSW [phone]

## 2016-01-26 NOTE — Progress Notes (Signed)
Pt to be discharged per MD order. PICC line removed. Report called to [facility]. Will send pt with packet for facility

## 2016-01-26 NOTE — Clinical Social Work Note (Signed)
Patient had a bed offer from [facility] and patient accepted and MD discharged to [facility] this afternoon. Patient's sister is aware and patient transported via EMS. Discharge information sent at 2:30pm.  York Spaniel MSW,LCSW [phone]

## 2016-01-26 NOTE — Progress Notes (Signed)
Physical Therapy Treatment Patient Details Name: Dennis Fitzgerald MRN: [id number] DOB: 1956-08-20 Today's Date: 01/26/2016    History of Present Illness Pt is a 60 y.o. male presenting to hospital with diabetic R foot ulcer.  MRI showing osteomyelitis 5th metatarsal base and possibly the cuboid.  Pt s/p 01/16/16 debridement and partial removal infected 5th metatarsal R foot; s/p R LE angio 01/20/16; and s/p debridement 01/21/16.  PMH includes diabetes, chronic lymphadema, L fem bypass for PAD.    PT Comments    Pt still having some difficulty maintaining R LE WB'ing precautions but appears to be improving some with continuous cueing and education.  Mobility limited to bed to chair d/t concerns of maintaining WB'ing precautions.  Continue to recommend discharge to STR to improve safety and independence with functional mobility required for home.   Follow Up Recommendations  SNF     Equipment Recommendations       Recommendations for Other Services       Precautions / Restrictions Precautions Precautions: Fall Precaution Comments: Per latest consult from podiatry patient can use as little weight as possible on RLE for bathroom priveledges or bedside commode. Wound vac on R foot.   Restrictions Weight Bearing Restrictions: Yes RLE Weight Bearing: Non weight bearing Other Position/Activity Restrictions: "As little weight as possible on RLE" with orthowedge shoe and RW.     Mobility  Bed Mobility Overal bed mobility: Modified Independent Bed Mobility: Supine to Sit     Supine to sit: Modified independent (Device/Increase time);HOB elevated        Transfers Overall transfer level: Needs assistance Equipment used: Rolling walker (2 wheeled) Transfers: Sit to/from Stand Sit to Stand: Min assist         General transfer comment: Pt with some difficulty maintaining R LE minimal WB'ing status with transfer (orthowedge donned); unsteady initially upon standing; vc's for WB'ing status  and technique required  Ambulation/Gait Ambulation/Gait assistance: Min assist Ambulation Distance (Feet): 3 Feet Assistive device: Rolling walker (2 wheeled)       General Gait Details: Patient educated on minimal WBing on RLE (for touchdown balance with orthowedge shoe), otherwise "hopping" only was performed with mild difficulty with maintaining WBing status during ambulation. Pt required assist to steady d/t balance impairments.   Stairs            Wheelchair Mobility    Modified Rankin (Stroke Patients Only)       Balance Overall balance assessment: Needs assistance Sitting-balance support: No upper extremity supported;Feet unsupported Sitting balance-Leahy Scale: Normal     Standing balance support: Bilateral upper extremity supported (on RW) Standing balance-Leahy Scale: Fair                      Cognition Arousal/Alertness: Awake/alert Behavior During Therapy: Impulsive Overall Cognitive Status: Within Functional Limits for tasks assessed                      Exercises   Performed semi-supine B LE therapeutic exercise x 10 reps:  quad sets x3 second holds (AROM B LE's); SAQ's (AROM R; AROM L); heelslides (AROM R; AROM L), hip abd/adduction (AROM R; AROM L).  Performed sitting exercises x 10 reps B LE's: LAQ's (AROM R; AROM L); marching/hip flexion (AROM R; AROM L).  Pt required vc's for correct technique with exercises.     General Comments General comments (skin integrity, edema, etc.): R foot wound vac in place  Nursing cleared pt for participation in  physical therapy.  Pt agreeable to PT session.       Pertinent Vitals/Pain Pain Assessment: 0-10 Pain Score: 4  Pain Location: R foot wound Pain Descriptors / Indicators: Aching Pain Intervention(s): Limited activity within patient's tolerance;Monitored during session;Premedicated before session;Repositioned    Home Living                      Prior Function             PT Goals (current goals can now be found in the care plan section) Acute Rehab PT Goals Patient Stated Goal: To get out of the hospital.  PT Goal Formulation: With patient Time For Goal Achievement: 02/06/16 Potential to Achieve Goals: Good Progress towards PT goals: Progressing toward goals    Frequency  Min 2X/week    PT Plan Current plan remains appropriate    Co-evaluation             End of Session Equipment Utilized During Treatment: Gait belt Activity Tolerance: Patient tolerated treatment well Patient left: in chair;with call bell/phone within reach (B LE's elevated)     Time: 4098-1191 PT Time Calculation (min) (ACUTE ONLY): 23 min  Charges:  $Therapeutic Exercise: 8-22 mins $Therapeutic Activity: 8-22 mins                    G CodesHendricks Limes 02-21-2016, 5:13 PM Hendricks Limes, PT [phone]

## 2016-01-26 NOTE — Clinical Social Work Placement (Signed)
   CLINICAL SOCIAL WORK PLACEMENT  NOTE  Date:  01/26/2016  Patient Details  Name: Dennis Fitzgerald MRN: [id number] Date of Birth: September 06, 1956  Clinical Social Work is seeking post-discharge placement for this patient at the Skilled  Nursing Facility level of care (*CSW will initial, date and re-position this form in  chart as items are completed):  Yes   Patient/family provided with [facility] Clinical Social Work Department's list of facilities offering this level of care within the geographic area requested by the patient (or if unable, by the patient's family).  Yes   Patient/family informed of their freedom to choose among providers that offer the needed level of care, that participate in Medicare, Medicaid or managed care program needed by the patient, have an available bed and are willing to accept the patient.  Yes   Patient/family informed of [facility]'s ownership interest in [facility] and [facility], as well as of the fact that they are under no obligation to receive care at these facilities.  PASRR submitted to EDS on 01/20/16     PASRR number received on 01/20/16     Existing PASRR number confirmed on       FL2 transmitted to all facilities in geographic area requested by pt/family on 01/20/16     FL2 transmitted to all facilities within larger geographic area on       Patient informed that his/her managed care company has contracts with or will negotiate with certain facilities, including the following:        Yes   Patient/family informed of bed offers received.  Patient chooses bed at  [facility])     Physician recommends and patient chooses bed at  [facility])    Patient to be transferred to  [facility]) on 01/26/16.  Patient to be transferred to facility by  (EMS)     Patient family notified on 01/26/16 of transfer.  Name of family member notified:  sister     PHYSICIAN       Additional Comment:     _______________________________________________ York Spaniel, LCSW 01/26/2016, 4:41 PM

## 2016-01-26 NOTE — Discharge Summary (Signed)
[facility] [place] [facility]   PATIENT NAME: Dennis Fitzgerald    MR#:  [id number]  DATE OF BIRTH:  1956/09/26  DATE OF ADMISSION:  01/14/2016 ADMITTING PHYSICIAN: Adrian Saran, MD  DATE OF DISCHARGE: 01/26/2016  PRIMARY CARE PHYSICIAN: Leanna Sato, MD    ADMISSION DIAGNOSIS:  Wound infection (HCC) [T14.8, L08.9]  DISCHARGE DIAGNOSIS:  Active Problems:   Diabetic foot ulcer (HCC)   SECONDARY DIAGNOSIS:   Past Medical History  Diagnosis Date  . Diabetes mellitus without complication (HCC)   . Lymphedema   . COPD (chronic obstructive pulmonary disease) (HCC)     HOSPITAL COURSE:   1. 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 Patient is status post debridement with partial removal of infected fifth metatarsal. Patient has a wound VAC on as per podiatry. Infectious disease specialist changed recommendations to a total of 4 weeks of Cipro. I wrote a prescription for the completion of course through March 2. Social worker did get a rehabilitation bed for the wound VAC and this likely will be needed for 4 weeks also. Follow-up with podiatry and infectious disease 2 weeks. 2. Peripheral vascular disease status post angiogram 131 with PTCA to the right superficial femoral artery 3. Type 2 diabetes with good control. Continue current regimen 4. History of COPD continue inhalers 5. Hyperlipidemia on atorvastatin 6. Heparin-induced thrombocytopenia. Lovenox was stopped. Pharmacist sent off a specialty tests. Platelet count recovered 259,000. It was as low as 76,000. I will use Arixtra injection for DVT prophylaxis until more ambulatory 7. Physical therapy at rehabilitation. Patient has a specialty boot for tipping weight off the toes. 8. Fungal infection bilateral feet. I will use Lac-Hydrin lotion for week and then you can add a fungal cream to the feet after that.  DISCHARGE CONDITIONS:   Satisfactory  CONSULTS OBTAINED:  Treatment  Team:  Clydie Braun, MD  DRUG ALLERGIES:  No Known Allergies  DISCHARGE MEDICATIONS:   Current Discharge Medication List    START taking these medications   Details  ammonium lactate (LAC-HYDRIN) 12 % lotion Apply topically 2 (two) times daily. Qty: 400 g, Refills: 0    fondaparinux (ARIXTRA) 2.5 MG/0.5ML SOLN injection contniue until ambulatory to prevent dvt Qty: 3.5 mL, Refills: 0    insulin aspart (NOVOLOG) 100 UNIT/ML injection Inject 5 Units into the skin 3 (three) times daily with meals. Qty: 10 mL, Refills: 0    insulin aspart protamine- aspart (NOVOLOG MIX 70/30) (70-30) 100 UNIT/ML injection Inject 0.2 mLs (20 Units total) into the skin daily with supper. Qty: 10 mL, Refills: 0    magic mouthwash SOLN Take 10 mLs by mouth 3 (three) times daily as needed for mouth pain. Qty: 30 mL, Refills: 0    oxyCODONE (OXY IR/ROXICODONE) 5 MG immediate release tablet Take 1-2 tablets (5-10 mg total) by mouth every 4 (four) hours as needed for moderate pain. Qty: 30 tablet, Refills: 0    senna (SENOKOT) 8.6 MG TABS tablet Take 1 tablet (8.6 mg total) by mouth daily. Qty: 30 each, Refills: 0      CONTINUE these medications which have CHANGED   Details  ciprofloxacin (CIPRO) 500 MG tablet Twice a day for 23 more days the stop Qty: 46 tablet, Refills: 0      CONTINUE these medications which have NOT CHANGED   Details  albuterol (PROVENTIL HFA;VENTOLIN HFA) 108 (90 Base) MCG/ACT inhaler Inhale 2 puffs into the lungs every 4 (four) hours as needed for wheezing or  shortness of breath.    aspirin EC 81 MG tablet Take 81 mg by mouth every evening.    atorvastatin (LIPITOR) 40 MG tablet Take 40 mg by mouth every evening.    clopidogrel (PLAVIX) 75 MG tablet Take 75 mg by mouth every evening.    furosemide (LASIX) 40 MG tablet Take 40 mg by mouth 2 (two) times daily.    lisinopril (PRINIVIL,ZESTRIL) 10 MG tablet Take 10 mg by mouth every evening.    potassium chloride  (K-DUR,KLOR-CON) 10 MEQ tablet Take 10 mEq by mouth every evening.    Umeclidinium-Vilanterol (ANORO ELLIPTA) 62.5-25 MCG/INH AEPB Inhale 1 puff into the lungs every evening.       STOP taking these medications     clindamycin (CLEOCIN) 150 MG capsule      collagenase (SANTYL) ointment      insulin NPH-regular Human (NOVOLIN 70/30) (70-30) 100 UNIT/ML injection      linagliptin (TRADJENTA) 5 MG TABS tablet      metFORMIN (GLUCOPHAGE) 1000 MG tablet          DISCHARGE INSTRUCTIONS:    Follow-up with Dr. rehabilitation 1-2 days Follow-up with physical therapy at rehabilitation once today's  If you experience worsening of your admission symptoms, develop shortness of breath, life threatening emergency, suicidal or homicidal thoughts you must seek medical attention immediately by calling 911 or calling your MD immediately  if symptoms less severe.  You Must read complete instructions/literature along with all the possible adverse reactions/side effects for all the Medicines you take and that have been prescribed to you. Take any new Medicines after you have completely understood and accept all the possible adverse reactions/side effects.   Please note  You were cared for by a hospitalist during your hospital stay. If you have any questions about your discharge medications or the care you received while you were in the hospital after you are discharged, you can call the unit and asked to speak with the hospitalist on call if the hospitalist that took care of you is not available. Once you are discharged, your primary care physician will handle any further medical issues. Please note that NO REFILLS for any discharge medications will be authorized once you are discharged, as it is imperative that you return to your primary care physician (or establish a relationship with a primary care physician if you do not have one) for your aftercare needs so that they can reassess your need for  medications and monitor your lab values.    Today   CHIEF COMPLAINT:   Chief Complaint  Patient presents with  . Wound Infection    HISTORY OF PRESENT ILLNESS:  Dennis Fitzgerald  is a 60 y.o. male presented with a wound infection   VITAL SIGNS:  Blood pressure 142/77, pulse 70, temperature 97.7 F (36.5 C), temperature source Oral, resp. rate 17, height  (1.803 m), weight 113.399 kg (250 lb), SpO2 98 %.    PHYSICAL EXAMINATION:  GENERAL:  60 y.o.-year-old patient lying in the bed with no acute distress.  EYES: Pupils equal, round, reactive to light and accommodation. No scleral icterus. Extraocular muscles intact.  HEENT: Head atraumatic, normocephalic. Oropharynx and nasopharynx clear.  NECK:  Supple, no jugular venous distention. No thyroid enlargement, no tenderness.  LUNGS: Normal breath sounds bilaterally, no wheezing, rales,rhonchi or crepitation. No use of accessory muscles of respiration.  CARDIOVASCULAR: S1, S2 normal. No murmurs, rubs, or gallops.  ABDOMEN: Soft, non-tender, non-distended. Bowel sounds present. No organomegaly or mass.  EXTREMITIES: No pedal edema, cyanosis, or clubbing.  NEUROLOGIC: Cranial nerves II through XII are intact. Muscle strength 5/5 in all extremities. Sensation intact. Gait not checked.  PSYCHIATRIC: The patient is alert and oriented x 3.  SKIN: Right foot covered as per podiatry  DATA REVIEW:   CBC  Recent Labs Lab 01/26/16 0533  WBC 5.1  HGB 12.6*  HCT 37.2*  PLT 159    Chemistries   Recent Labs Lab 01/26/16 0533  NA 136  K 3.7  CL 99*  CO2 32  GLUCOSE 107*  BUN 7  CREATININE 0.71  CALCIUM 8.3*    Cardiac Enzymes No results for input(s): TROPONINI in the last 168 hours.  Microbiology Results  Results for orders placed or performed during the hospital encounter of 01/14/16  Wound culture     Status: None   Collection Time: 01/14/16  7:16 PM  Result Value Ref Range Status   Specimen Description FOOT   Final   Special Requests NONE  Final   Gram Stain   Final    FEW WBC SEEN RARE GRAM POSITIVE COCCI IN PAIRS FEW GRAM VARIABLE ROD    Culture LIGHT GROWTH ENTEROBACTER CLOACAE  Final   Report Status 01/18/2016 FINAL  Final   Organism ID, Bacteria ENTEROBACTER CLOACAE  Final      Susceptibility   Enterobacter cloacae - MIC*    PIP/TAZO Value in next row Sensitive      SENSITIVE<=4    CEFTAZIDIME Value in next row Sensitive      SENSITIVE<=1    CEFTRIAXONE Value in next row Sensitive      SENSITIVE<=1    IMIPENEM Value in next row Sensitive      SENSITIVE<=0.25    GENTAMICIN Value in next row Sensitive      SENSITIVE<=1    CIPROFLOXACIN Value in next row Sensitive      SENSITIVE<=0.25    TRIMETH/SULFA Value in next row Sensitive      SENSITIVE<=20    CEFEPIME Value in next row Sensitive      SENSITIVE<=1    * LIGHT GROWTH ENTEROBACTER CLOACAE  MRSA PCR Screening     Status: None   Collection Time: 01/15/16 11:40 PM  Result Value Ref Range Status   MRSA by PCR NEGATIVE NEGATIVE Final    Comment:        The GeneXpert MRSA Assay (FDA approved for NASAL specimens only), is one component of a comprehensive MRSA colonization surveillance program. It is not intended to diagnose MRSA infection nor to guide or monitor treatment for MRSA infections.   Anaerobic culture     Status: None   Collection Time: 01/16/16 10:22 AM  Result Value Ref Range Status   Specimen Description WOUND  Final   Special Requests NONE  Final   Culture NO ANAEROBES ISOLATED  Final   Report Status 01/20/2016 FINAL  Final  Wound culture     Status: None   Collection Time: 01/16/16 10:22 AM  Result Value Ref Range Status   Specimen Description WOUND  Final   Special Requests NONE  Final   Gram Stain RARE WBC SEEN NO ORGANISMS SEEN   Final   Culture LIGHT GROWTH ENTEROBACTER CLOACAE  Final   Report Status 01/19/2016 FINAL  Final   Organism ID, Bacteria ENTEROBACTER CLOACAE  Final       Susceptibility   Enterobacter cloacae - MIC*    PIP/TAZO Value in next row Sensitive      SENSITIVE<=4  CEFTAZIDIME Value in next row Sensitive      SENSITIVE<=1    CEFTRIAXONE Value in next row Sensitive      SENSITIVE<=1    CEFEPIME Value in next row Sensitive      SENSITIVE<=1    IMIPENEM Value in next row Sensitive      SENSITIVE<=0.25    GENTAMICIN Value in next row Sensitive      SENSITIVE<=1    CIPROFLOXACIN Value in next row Sensitive      SENSITIVE<=0.25    TRIMETH/SULFA Value in next row Sensitive      SENSITIVE<=20    * LIGHT GROWTH ENTEROBACTER CLOACAE    Management plans discussed with the patient, family and they are in agreement.  CODE STATUS:     Code Status Orders        Start     Ordered   01/14/16 1638  Full code   Continuous     01/14/16 1637    Code Status History    Date Active Date Inactive Code Status Order ID Comments User Context   01/14/2016  3:14 PM 01/14/2016  4:37 PM Partial Code [id number]  Adrian Saran, MD ED      TOTAL TIME TAKING CARE OF THIS PATIENT: 40 minutes.    Alford Highland M.D on 01/26/2016 at 2:28 PM  Between 7am to 6pm - Pager - [phone]  After 6pm go to [email or web address] - password [email or web address]  [facility]  Office  [phone]  CC: Primary care physician; Leanna Sato, MD

## 2016-01-26 NOTE — Progress Notes (Signed)
MEDICATION RELATED CONSULT NOTE - INITIAL   Pharmacy Consult for Arixtra Indication: DVT prophylaxis   No Known Allergies  Patient Measurements: Height:  (180.3 cm) Weight: 250 lb (113.399 kg) IBW/kg (Calculated) : 75.3 Adjusted Body Weight:   Vital Signs:   Intake/Output from previous day: 02/06 0701 - 02/07 0700 In: 1320 [P.O.:1320] Out: 4225 [Urine:4225] Intake/Output from this shift: Total I/O In: 240 [P.O.:240] Out: -   Labs:  Recent Labs  01/24/16 0533 01/26/16 0533  WBC 3.1* 5.1  HGB 11.9* 12.6*  HCT 35.8* 37.2*  PLT 96* 159  CREATININE  --  0.71   Estimated Creatinine Clearance: 127.3 mL/min (by C-G formula based on Cr of 0.71).   Microbiology: Recent Results (from the past 720 hour(s))  Culture, blood (routine x 2)     Status: None   Collection Time: 01/12/16  1:15 PM  Result Value Ref Range Status   Specimen Description BLOOD LEFT HAND  Final   Special Requests   Final    BOTTLES DRAWN AEROBIC AND ANAEROBIC AERO 4CC ANA 5CC   Culture NO GROWTH 5 DAYS  Final   Report Status 01/17/2016 FINAL  Final  Culture, blood (routine x 2)     Status: None   Collection Time: 01/12/16  1:24 PM  Result Value Ref Range Status   Specimen Description BLOOD RIGHT HAND  Final   Special Requests   Final    BOTTLES DRAWN AEROBIC AND ANAEROBIC AERO 9CC ANA 5CC   Culture NO GROWTH 5 DAYS  Final   Report Status 01/17/2016 FINAL  Final  Wound culture     Status: None   Collection Time: 01/14/16  7:16 PM  Result Value Ref Range Status   Specimen Description FOOT  Final   Special Requests NONE  Final   Gram Stain   Final    FEW WBC SEEN RARE GRAM POSITIVE COCCI IN PAIRS FEW GRAM VARIABLE ROD    Culture LIGHT GROWTH ENTEROBACTER CLOACAE  Final   Report Status 01/18/2016 FINAL  Final   Organism ID, Bacteria ENTEROBACTER CLOACAE  Final      Susceptibility   Enterobacter cloacae - MIC*    PIP/TAZO Value in next row Sensitive      SENSITIVE<=4    CEFTAZIDIME  Value in next row Sensitive      SENSITIVE<=1    CEFTRIAXONE Value in next row Sensitive      SENSITIVE<=1    IMIPENEM Value in next row Sensitive      SENSITIVE<=0.25    GENTAMICIN Value in next row Sensitive      SENSITIVE<=1    CIPROFLOXACIN Value in next row Sensitive      SENSITIVE<=0.25    TRIMETH/SULFA Value in next row Sensitive      SENSITIVE<=20    CEFEPIME Value in next row Sensitive      SENSITIVE<=1    * LIGHT GROWTH ENTEROBACTER CLOACAE  MRSA PCR Screening     Status: None   Collection Time: 01/15/16 11:40 PM  Result Value Ref Range Status   MRSA by PCR NEGATIVE NEGATIVE Final    Comment:        The GeneXpert MRSA Assay (FDA approved for NASAL specimens only), is one component of a comprehensive MRSA colonization surveillance program. It is not intended to diagnose MRSA infection nor to guide or monitor treatment for MRSA infections.   Anaerobic culture     Status: None   Collection Time: 01/16/16 10:22 AM  Result Value Ref  Range Status   Specimen Description WOUND  Final   Special Requests NONE  Final   Culture NO ANAEROBES ISOLATED  Final   Report Status 01/20/2016 FINAL  Final  Wound culture     Status: None   Collection Time: 01/16/16 10:22 AM  Result Value Ref Range Status   Specimen Description WOUND  Final   Special Requests NONE  Final   Gram Stain RARE WBC SEEN NO ORGANISMS SEEN   Final   Culture LIGHT GROWTH ENTEROBACTER CLOACAE  Final   Report Status 01/19/2016 FINAL  Final   Organism ID, Bacteria ENTEROBACTER CLOACAE  Final      Susceptibility   Enterobacter cloacae - MIC*    PIP/TAZO Value in next row Sensitive      SENSITIVE<=4    CEFTAZIDIME Value in next row Sensitive      SENSITIVE<=1    CEFTRIAXONE Value in next row Sensitive      SENSITIVE<=1    CEFEPIME Value in next row Sensitive      SENSITIVE<=1    IMIPENEM Value in next row Sensitive      SENSITIVE<=0.25    GENTAMICIN Value in next row Sensitive      SENSITIVE<=1     CIPROFLOXACIN Value in next row Sensitive      SENSITIVE<=0.25    TRIMETH/SULFA Value in next row Sensitive      SENSITIVE<=20    * LIGHT GROWTH ENTEROBACTER CLOACAE    Medical History: Past Medical History  Diagnosis Date  . Diabetes mellitus without complication (HCC)   . Lymphedema   . COPD (chronic obstructive pulmonary disease) (HCC)     Medications:  Prescriptions prior to admission  Medication Sig Dispense Refill Last Dose  . albuterol (PROVENTIL HFA;VENTOLIN HFA) 108 (90 Base) MCG/ACT inhaler Inhale 2 puffs into the lungs every 4 (four) hours as needed for wheezing or shortness of breath.   prn at prn  . aspirin EC 81 MG tablet Take 81 mg by mouth every evening.   01/13/2016 at Unknown time  . atorvastatin (LIPITOR) 40 MG tablet Take 40 mg by mouth every evening.   01/13/2016 at Unknown time  . ciprofloxacin (CIPRO) 500 MG tablet Take 1 tablet (500 mg total) by mouth 2 (two) times daily. 20 tablet 0 01/13/2016 at Unknown time  . clindamycin (CLEOCIN) 150 MG capsule Take 3 capsules (450 mg total) by mouth 3 (three) times daily. 90 capsule 0 01/13/2016 at Unknown time  . clopidogrel (PLAVIX) 75 MG tablet Take 75 mg by mouth every evening.   01/13/2016 at Unknown time  . collagenase (SANTYL) ointment Apply 1 application topically 2 (two) times daily.   01/13/2016 at Unknown time  . furosemide (LASIX) 40 MG tablet Take 40 mg by mouth 2 (two) times daily.   01/13/2016 at Unknown time  . insulin NPH-regular Human (NOVOLIN 70/30) (70-30) 100 UNIT/ML injection Inject 50-55 Units into the skin 2 (two) times daily. Pt uses 55 units in the morning and 50 at night.   01/13/2016 at Unknown time  . linagliptin (TRADJENTA) 5 MG TABS tablet Take 5 mg by mouth every evening.    01/13/2016 at Unknown time  . lisinopril (PRINIVIL,ZESTRIL) 10 MG tablet Take 10 mg by mouth every evening.   01/13/2016 at Unknown time  . metFORMIN (GLUCOPHAGE) 1000 MG tablet Take 1,000 mg by mouth 2 (two) times daily.    01/13/2016 at Unknown time  . potassium chloride (K-DUR,KLOR-CON) 10 MEQ tablet Take 10 mEq by mouth every  evening.   01/13/2016 at Unknown time  . Umeclidinium-Vilanterol (ANORO ELLIPTA) 62.5-25 MCG/INH AEPB Inhale 1 puff into the lungs every evening.    01/13/2016 at Unknown time   Scheduled:  . ammonium lactate   Topical BID  . atorvastatin  40 mg Oral QPM  . bisacodyl  10 mg Rectal Once  . cefTRIAXone (ROCEPHIN)  IV  2 g Intravenous Q24H  . ciprofloxacin  500 mg Oral BID  . fondaparinux (ARIXTRA) injection  2.5 mg Subcutaneous Q0600  . furosemide  40 mg Oral BID  . insulin aspart  0-20 Units Subcutaneous TID WC  . insulin aspart  0-5 Units Subcutaneous QHS  . insulin aspart protamine- aspart  20 Units Subcutaneous Q supper  . insulin aspart protamine- aspart  30 Units Subcutaneous Q breakfast  . lactulose  30 g Oral BID  . linagliptin  5 mg Oral QPM  . lisinopril  10 mg Oral QPM  . potassium chloride  10 mEq Oral QPM  . salmeterol  1 puff Inhalation BID  . senna  1 tablet Oral Daily  . tiotropium  18 mcg Inhalation Daily    Assessment: Pharmacy to dose arixtra for DVT prophylaxis in this 60 year old male. Patient previously on Lovenox; however MD was concern about possible HIT. 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   Raquel Racey D 01/26/2016,2:23 PM

## 2016-01-27 ENCOUNTER — Non-Acute Institutional Stay: Payer: Self-pay | Admitting: Adult Health

## 2016-01-27 DIAGNOSIS — L97519 Non-pressure chronic ulcer of other part of right foot with unspecified severity: Principal | ICD-10-CM

## 2016-01-27 DIAGNOSIS — E785 Hyperlipidemia, unspecified: Secondary | ICD-10-CM

## 2016-01-27 DIAGNOSIS — E1169 Type 2 diabetes mellitus with other specified complication: Secondary | ICD-10-CM

## 2016-01-27 DIAGNOSIS — E08621 Diabetes mellitus due to underlying condition with foot ulcer: Secondary | ICD-10-CM

## 2016-01-27 DIAGNOSIS — I890 Lymphedema, not elsewhere classified: Secondary | ICD-10-CM

## 2016-01-27 DIAGNOSIS — E1151 Type 2 diabetes mellitus with diabetic peripheral angiopathy without gangrene: Secondary | ICD-10-CM | POA: Insufficient documentation

## 2016-01-27 MED ORDER — OXYCODONE HCL 5 MG PO TABS: 5.0000 mg | ORAL_TABLET | ORAL | Status: DC | PRN

## 2016-01-27 NOTE — Addendum Note (Signed)
Addended by: Sharee Holster on: 01/27/2016 02:40 PM   Modules accepted: Level of Service, SmartSet

## 2016-01-27 NOTE — Progress Notes (Signed)
Patient ID: Dennis Fitzgerald, male   DOB: 01-11-1956, 60 y.o.   MRN: [id number]    Facility:  [facility]       No Known Allergies  Chief Complaint  Patient presents with  . Discharge Note    HPI:  He has been hospitalized for a right foot diabetic ulcer. He had 2 surgical procedures done with antibiotic beads placed with a wound vac. He is wanting to go home today. He has made arrangements for follow up with Dr. Orland Jarred in the AM.  He will need a wheelchair and will need home health for nursing. He will need his prescriptions to be written.    Past Medical History  Diagnosis Date  . Diabetes mellitus without complication (HCC)   . Lymphedema   . COPD (chronic obstructive pulmonary disease) [facility])     Past Surgical History  Procedure Laterality Date  . Incision and drainage of wound Right 01/16/2016    Procedure: IRRIGATION AND DEBRIDEMENT WOUND;  Surgeon: Recardo Evangelist, DPM;  Location: [facility] ORS;  Service: Podiatry;  Laterality: Right;  . Application of wound vac Right 01/16/2016    Procedure: APPLICATION OF WOUND VAC;  Surgeon: Recardo Evangelist, DPM;  Location: [facility] ORS;  Service: Podiatry;  Laterality: Right;  . Irrigation and debridement foot Right 01/21/2016    Procedure: IRRIGATION AND DEBRIDEMENT FOOT;  Surgeon: Recardo Evangelist, DPM;  Location: [facility] ORS;  Service: Podiatry;  Laterality: Right;  . Peripheral vascular catheterization Right 01/20/2016    Procedure: Lower Extremity Angiography;  Surgeon: Renford Dills, MD;  Location: [facility] INVASIVE [facility];  Service: Cardiovascular;  Laterality: Right;  . Peripheral vascular catheterization Right 01/20/2016    Procedure: Lower Extremity Intervention;  Surgeon: Renford Dills, MD;  Location: [facility];  Service: Cardiovascular;  Laterality: Right;    VITAL SIGNS BP 134/69 mmHg  Pulse 70  Ht  (1.803 m)  Wt 250 lb (113.399 kg)  BMI 34.88 kg/m2  Patient's Medications  New Prescriptions   No medications on file    Previous Medications   ALBUTEROL (PROVENTIL HFA;VENTOLIN HFA) 108 (90 BASE) MCG/ACT INHALER    Inhale 2 puffs into the lungs every 4 (four) hours as needed for wheezing or shortness of breath.   AMMONIUM LACTATE (LAC-HYDRIN) 12 % LOTION    Apply topically 2 (two) times daily.   ASPIRIN EC 81 MG TABLET    Take 81 mg by mouth every evening.   ATORVASTATIN (LIPITOR) 40 MG TABLET    Take 40 mg by mouth every evening.   CIPROFLOXACIN (CIPRO) 500 MG TABLET    Twice a day for 23 more days the stop   CLOPIDOGREL (PLAVIX) 75 MG TABLET    Take 75 mg by mouth every evening.   FONDAPARINUX (ARIXTRA) 2.5 MG/0.5ML SOLN INJECTION    contniue until ambulatory to prevent dvt   FUROSEMIDE (LASIX) 40 MG TABLET    Take 40 mg by mouth 2 (two) times daily.   INSULIN ASPART (NOVOLOG) 100 UNIT/ML INJECTION    Inject 5 Units into the skin 3 (three) times daily with meals.   INSULIN ASPART PROTAMINE- ASPART (NOVOLOG MIX 70/30) (70-30) 100 UNIT/ML INJECTION    Inject 0.2 mLs (20 Units total) into the skin daily with supper.   LISINOPRIL (PRINIVIL,ZESTRIL) 10 MG TABLET    Take 10 mg by mouth every evening.   MAGIC MOUTHWASH SOLN    Take 10 mLs by mouth 3 (three) times daily as needed for mouth pain.  OXYCODONE (OXY IR/ROXICODONE) 5 MG IMMEDIATE RELEASE TABLET    Take 1-2 tablets (5-10 mg total) by mouth every 4 (four) hours as needed for moderate pain.   POTASSIUM CHLORIDE (K-DUR,KLOR-CON) 10 MEQ TABLET    Take 10 mEq by mouth every evening.   SENNA (SENOKOT) 8.6 MG TABS TABLET    Take 1 tablet (8.6 mg total) by mouth daily.   UMECLIDINIUM-VILANTEROL (ANORO ELLIPTA) 62.5-25 MCG/INH AEPB    Inhale 1 puff into the lungs every evening.   Modified Medications   No medications on file  Discontinued Medications   No medications on file     SIGNIFICANT DIAGNOSTIC EXAMS   LAB REVIEWED:   01-26-16: wbc 5.1; hgb 12.6; hct 37.2; mcv 82.1; plt 159; glucose 107; bun 7; creat 0.71; k+ 3.7; na++136    Review of Systems   Constitutional: Negative for malaise/fatigue.  Respiratory: Negative for cough and shortness of breath.   Cardiovascular: Positive for leg swelling. Negative for chest pain and palpitations.  Gastrointestinal: Negative for heartburn, abdominal pain and constipation.  Musculoskeletal: Negative for myalgias and joint pain.  Skin:       Has chronic lymphedema  Has right diabetic foot ulceration   Psychiatric/Behavioral: The patient is not nervous/anxious.     Physical Exam  Constitutional: He is oriented to person, place, and time. He appears well-developed and well-nourished. No distress.  Obese   Eyes: Conjunctivae are normal.  Neck: Neck supple. No JVD present. No thyromegaly present.  Cardiovascular: Normal rate, regular rhythm and intact distal pulses.   Respiratory: Effort normal and breath sounds normal. No respiratory distress. He has no wheezes.  GI: Soft. Bowel sounds are normal. He exhibits no distension. There is no tenderness.  Musculoskeletal: He exhibits edema.  Able to move all extremities  Has chronic bilateral lower extremity lymphedema   Lymphadenopathy:    He has no cervical adenopathy.  Neurological: He is alert and oriented to person, place, and time.  Skin: Skin is warm and dry. He is not diaphoretic.  Right lower leg with redness is chronic in nature Right lateral foot diabetic foot ulceration will need wound vac. He is going home today.   Psychiatric: He has a normal mood and affect.      ASSESSMENT/ PLAN:  Will discharge him to home. He will need home health for RN to evaluate and treat as indicated for right foot diabetic ulcer with wound vac to change dressing three times weekly. He will need a standard wheelchair which allow him to remain independent within the community due to his inability to bear weight upon his right foot; which cannot be achieved with a walker. He can self propel. His medicaitons have been written for a 30 day supply of his  medications with #30 oxycodone 5 mg tabs. He has a follow up with Dr. Orland Jarred on 01-28-16.      Time spent with patient  50   minutes >50% time spent counseling; reviewing medical record; tests; labs; and developing future plan of care   Synthia Innocent NP [facility]  Contact [phone] Monday through Friday 8am- 5pm  After hours call [phone]

## 2016-01-27 NOTE — Progress Notes (Signed)
This encounter was created in error - please disregard.

## 2016-01-28 ENCOUNTER — Non-Acute Institutional Stay (SKILLED_NURSING_FACILITY): Payer: Medicaid Other | Admitting: Internal Medicine

## 2016-01-28 ENCOUNTER — Encounter: Payer: Self-pay | Admitting: Internal Medicine

## 2016-01-28 DIAGNOSIS — E1151 Type 2 diabetes mellitus with diabetic peripheral angiopathy without gangrene: Secondary | ICD-10-CM

## 2016-01-28 DIAGNOSIS — B353 Tinea pedis: Secondary | ICD-10-CM

## 2016-01-28 DIAGNOSIS — L97509 Non-pressure chronic ulcer of other part of unspecified foot with unspecified severity: Secondary | ICD-10-CM

## 2016-01-28 DIAGNOSIS — D7582 Heparin induced thrombocytopenia (HIT): Secondary | ICD-10-CM | POA: Diagnosis not present

## 2016-01-28 DIAGNOSIS — D75829 Heparin-induced thrombocytopenia, unspecified: Secondary | ICD-10-CM

## 2016-01-28 DIAGNOSIS — L97519 Non-pressure chronic ulcer of other part of right foot with unspecified severity: Secondary | ICD-10-CM | POA: Diagnosis not present

## 2016-01-28 DIAGNOSIS — E785 Hyperlipidemia, unspecified: Secondary | ICD-10-CM

## 2016-01-28 DIAGNOSIS — E11621 Type 2 diabetes mellitus with foot ulcer: Secondary | ICD-10-CM

## 2016-01-28 DIAGNOSIS — J449 Chronic obstructive pulmonary disease, unspecified: Secondary | ICD-10-CM

## 2016-01-28 NOTE — Progress Notes (Signed)
MRN: [id number] Name: Dennis Fitzgerald  Sex: male Age: 60 y.o. DOB: [birth year]  PSC #: Ronni Rumble Facility/Room:130B Level Of Care: SNF Provider: Merrilee Seashore D Emergency Contacts: Extended Emergency Contact Information Primary Emergency Contact: Lloyd Huger [place] Home Phone: [phone] Relation: Sister  Code Status:   Allergies: Review of patient's allergies indicates no known allergies.  Chief Complaint  Patient presents with  . New Admit To SNF    HPI: Patient is 60 y.o. 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 Pt is admitted to SNF for 4 weeks of cipro and wound vac care as well as OT/PT. While at SNF pt will be followed for DM, tx with insulin, HLD, tx with lipitor and COPD, tx with anora ellipta.  Past Medical History  Diagnosis Date  . Diabetes mellitus without complication (HCC)   . Lymphedema   . COPD (chronic obstructive pulmonary disease) (HCC)   . Hyperlipidemia   . PVD (peripheral vascular disease) [facility])     Past Surgical History  Procedure Laterality Date  . Incision and drainage of wound Right 01/16/2016    Procedure: IRRIGATION AND DEBRIDEMENT WOUND;  Surgeon: Recardo Evangelist, DPM;  Location: [facility] ORS;  Service: Podiatry;  Laterality: Right;  . Application of wound vac Right 01/16/2016    Procedure: APPLICATION OF WOUND VAC;  Surgeon: Recardo Evangelist, DPM;  Location: [facility] ORS;  Service: Podiatry;  Laterality: Right;  . Irrigation and debridement foot Right 01/21/2016    Procedure: IRRIGATION AND DEBRIDEMENT FOOT;  Surgeon: Recardo Evangelist, DPM;  Location: [facility] ORS;  Service: Podiatry;  Laterality: Right;  . Peripheral vascular catheterization Right 01/20/2016    Procedure: Lower Extremity Angiography;  Surgeon: Renford Dills, MD;  Location: [facility] INVASIVE CV [facility];  Service: Cardiovascular;  Laterality: Right;  .  Peripheral vascular catheterization Right 01/20/2016    Procedure: Lower Extremity Intervention;  Surgeon: Renford Dills, MD;  Location: [facility];  Service: Cardiovascular;  Laterality: Right;      Medication List       This list is accurate as of: 01/28/16 11:59 PM.  Always use your most recent med list.               albuterol 108 (90 Base) MCG/ACT inhaler  Commonly known as:  PROVENTIL HFA;VENTOLIN HFA  Inhale 2 puffs into the lungs every 4 (four) hours as needed for wheezing or shortness of breath.     ammonium lactate 12 % lotion  Commonly known as:  LAC-HYDRIN  Apply topically 2 (two) times daily.     ANORO ELLIPTA 62.5-25 MCG/INH Aepb  Generic drug:  Umeclidinium-Vilanterol  Inhale 1 puff into the lungs every evening.     aspirin EC 81 MG tablet  Take 81 mg by mouth every evening.     atorvastatin 40 MG tablet  Commonly known as:  LIPITOR  Take 40 mg by mouth every evening.     ciprofloxacin 500 MG tablet  Commonly known as:  CIPRO  Twice a day for 23 more days the stop     clopidogrel 75 MG tablet  Commonly known as:  PLAVIX  Take 75 mg by mouth every evening.     fondaparinux 2.5 MG/0.5ML Soln injection  Commonly known as:  ARIXTRA  contniue until ambulatory to prevent dvt     furosemide 40 MG tablet  Commonly known  as:  LASIX  Take 40 mg by mouth 2 (two) times daily.     insulin aspart 100 UNIT/ML injection  Commonly known as:  novoLOG  Inject 5 Units into the skin 3 (three) times daily with meals.     insulin aspart protamine- aspart (70-30) 100 UNIT/ML injection  Commonly known as:  NOVOLOG MIX 70/30  Inject 0.2 mLs (20 Units total) into the skin daily with supper.     lisinopril 10 MG tablet  Commonly known as:  PRINIVIL,ZESTRIL  Take 10 mg by mouth every evening.     magic mouthwash Soln  Take 10 mLs by mouth 3 (three) times daily as needed for mouth pain.     oxyCODONE 5 MG immediate release tablet  Commonly known as:  Oxy  IR/ROXICODONE  Take 1-2 tablets (5-10 mg total) by mouth every 4 (four) hours as needed for moderate pain.     potassium chloride 10 MEQ tablet  Commonly known as:  K-DUR,KLOR-CON  Take 10 mEq by mouth every evening.     senna 8.6 MG Tabs tablet  Commonly known as:  SENOKOT  Take 1 tablet (8.6 mg total) by mouth daily.        No orders of the defined types were placed in this encounter.     There is no immunization history on file for this patient.  Social History  Substance Use Topics  . Smoking status: Current Every Day Smoker  . Smokeless tobacco: Not on file  . Alcohol Use: No    Family history is + DM   Review of Systems  DATA OBTAINED: from patient GENERAL:  no fevers, fatigue, appetite changes SKIN: No itching, rash or wounds EYES: No eye pain, redness, discharge EARS: No earache, tinnitus, change in hearing NOSE: No congestion, drainage or bleeding  MOUTH/THROAT: No mouth or tooth pain, No sore throat RESPIRATORY: No cough, wheezing, SOB CARDIAC: No chest pain, palpitations, lower extremity edema  GI: No abdominal pain, No N/V/D or constipation, No heartburn or reflux  GU: No dysuria, frequency or urgency, or incontinence  MUSCULOSKELETAL: No unrelieved bone/joint pain NEUROLOGIC: No headache, dizziness or focal weakness PSYCHIATRIC: No c/o anxiety or sadness   Filed Vitals:   01/30/16 2239  BP: 135/70  Pulse: 68  Temp: 96.5 F (35.8 C)  Resp: 20    SpO2 Readings from Last 1 Encounters:  01/30/16 98%        Physical Exam  GENERAL APPEARANCE: Alert, conversant,  No acute distress.  SKIN: No diaphoresis rash HEAD: Normocephalic, atraumatic  EYES: Conjunctiva/lids clear. Pupils round, reactive. EOMs intact.  EARS: External exam WNL, canals clear. Hearing grossly normal.  NOSE: No deformity or discharge.  MOUTH/THROAT: Lips w/o lesions  RESPIRATORY: Breathing is even, unlabored. Lung sounds are clear   CARDIOVASCULAR: Heart RRR no murmurs,  rubs or gallops. No peripheral edema.   GASTROINTESTINAL: Abdomen is soft, non-tender, not distended w/ normal bowel sounds. GENITOURINARY: Bladder non tender, not distended  MUSCULOSKELETAL: dressing and boot R foot NEUROLOGIC:  Cranial nerves 2-12 grossly intact. Moves all extremities  PSYCHIATRIC: Mood and affect appropriate to situation, no behavioral issues  Patient Active Problem List   Diagnosis Date Noted  . Type 2 diabetes mellitus, controlled (HCC) 01/30/2016  . 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 COPD (chronic obstructive pulmonary disease) (HCC) 01/30/2016  . Hyperlipidemia 01/30/2016  . Fungal infection of foot 01/30/2016  . Diabetes mellitus type 2 with peripheral artery disease (HCC) 01/27/2016  . Dyslipidemia associated with  type 2 diabetes mellitus (HCC) 01/27/2016  . Chronic acquired lymphedema 01/27/2016  . Diabetic foot ulcer (HCC) 01/14/2016    CBC    Component Value Date/Time   WBC 5.1 01/26/2016 0533   RBC 4.53 01/26/2016 0533   HGB 12.6* 01/26/2016 0533   HCT 37.2* 01/26/2016 0533   PLT 159 01/26/2016 0533   MCV 82.1 01/26/2016 0533   LYMPHSABS 0.3* 01/19/2016 0552   MONOABS 0.5 01/19/2016 0552   EOSABS 0.3 01/19/2016 0552   BASOSABS 0.0 01/19/2016 0552    CMP     Component Value Date/Time   NA 136 01/26/2016 0533   K 3.7 01/26/2016 0533   CL 99* 01/26/2016 0533   CO2 32 01/26/2016 0533   GLUCOSE 107* 01/26/2016 0533   BUN 7 01/26/2016 0533   CREATININE 0.71 01/26/2016 0533   CALCIUM 8.3* 01/26/2016 0533   GFRNONAA >60 01/26/2016 0533   GFRAA >60 01/26/2016 0533    No results found for: HGBA1C   Mr Foot Right W Wo Contrast  01/16/2016  CLINICAL DATA:  Foot ulcer near the heel, starting in December 2016, worsening recently. Diabetes. EXAM: MRI OF THE RIGHT HINDFOOT WITHOUT AND WITH CONTRAST TECHNIQUE: Multiplanar, multisequence MR imaging was performed both before and after administration of intravenous contrast.  CONTRAST:  20mL MULTIHANCE GADOBENATE DIMEGLUMINE 529 MG/ML IV SOLN COMPARISON:  01/14/2016 radiograph FINDINGS: Diffuse subcutaneous edema in the distal calf and ankle noted. There is an ulceration adjacent to the base of fifth metatarsal. Underlying this ulceration there is a 7.6 by 3.9 by 2.0 cm volume of hypoenhancing and likely necrotic tissue. This extends down to the base of the fifth metatarsal which demonstrates diffuse abnormal edema and enhancement compatible with osteomyelitis of the fifth metatarsal. Edema and enhancement is most severe at the metatarsal base but extends in the metatarsal shaft towards the metatarsal head. The peroneus brevis appears to still inserts on the edematous base of the fifth metatarsal in the peroneus longus skirts the margin of the necrotic tissue. There is some tissue necrosis and abnormal edema and enhancement in the abductor digiti minimi muscle as it passes below the fifth metatarsal. The area of soft tissue necrosis abuts the cuboid and there is some faintly increased T2 signal and enhancement in this adjacent portion of the cuboid near the peroneus longus tendon which could reflect an early osteomyelitis for example images 11 through 14 of series 15. There is also abnormal edema and enhancement in the bony articular confluence of the navicular, middle and medial cuneiform, and cuboid as shown on images 16 through 23 of series 11. Of these the most severely involved is the navicular. I am uncertain whether this edema and enhancement is due to early osteomyelitis or underlying arthropathy. Lisfranc ligament intact. There is peroneus longus and peroneus brevis tenosynovitis. There is 5 mm non-fragmented osteochondral lesion of the lateral talar dome on image 22 series 14. Cellulitis noted in the foot especially severe in the vicinity of the ulceration. Please note that the region of the metatarsal heads and forefoot was not included on today's exam. IMPRESSION: 1. 7.6 by  3.9 by 2.0 cm volume of hypoenhancing necrotic tissue underlying the ulceration near the base of the fifth metatarsal osteomyelitis of the base of the fifth metatarsal extending to the margin of the fifth metatarsal. Diffuse osteomyelitis in the fifth metatarsal especially severe at its base. Possible early osteomyelitis involving the inferior-lateral cuboid, and also in the navicular. Questionable early osteomyelitis in the middle and medial cuneiforms.  2. Diffuse infiltrative edema in the calf and ankle compatible with cellulitis. 3. Incidental 5 mm non-fragmented osteochondral lesion of the lateral talar dome. Electronically Signed   By: Gaylyn Rong M.D.   On: 01/16/2016 08:14   Dg Foot Complete Right  01/14/2016  CLINICAL DATA:  History of poorly controlled diabetes, chronic lymphedema, dorsal foot ulcer for 7 weeks. Currently on antibiotics but with persistent symptoms. EXAM: RIGHT FOOT COMPLETE - 3+ VIEW COMPARISON:  Right foot plain film dated 01/12/2016 and right foot plain film dated 12/28/2015. FINDINGS: Again noted is an apparent soft tissue defect overlying the right fifth metatarsal base. Compared to earlier plain film from 12/28/2015, there appear to be developing demineralization within the fifth metatarsal base and developing defects along the lateral cortex margin of the fifth metatarsal base, highly suspicious for developing osteomyelitis. Remainder of the osseous structures of the right foot appear intact and normal in mineralization. No soft tissue gas seen. IMPRESSION: 1. Presumed soft tissue defect/ulceration overlying the base of the right fifth metatarsal base. 2. Probable developing demineralization and cortex discontinuity along the lateral margin of the underlying fifth metatarsal base, highly suspicious for developing osteomyelitis. Would consider MRI for further characterization. These results were called by telephone at the time of interpretation on 01/14/2016 at 1:34 pm to Dr.  Ileana Roup , who verbally acknowledged these results. Electronically Signed   By: Bary Richard M.D.   On: 01/14/2016 13:36    Not all labs, radiology exams or other studies done during hospitalization come through on my EPIC note; however they are reviewed by me.    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 Patient is status post debridement with partial removal of infected fifth metatarsal. Patient has a wound VAC on as per podiatry. Infectious disease specialist changed recommendations to a total of 4 weeks of Cipro. I wrote a prescription for the completion of course through March 2. Social worker did get a rehabilitation bed for the wound VAC and this likely will be needed for 4 weeks also. Follow-up with podiatry and infectious disease 2 weeks. 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 Lovenox was stopped. Pharmacist sent off a specialty tests. Platelet count recovered 259,000. It was as low as 76,000. I will use Arixtra injection for DVT prophylaxis until more ambulatory  SNF - will continue arixtra until ortho says stop, prob 4-6 weeks; will follow with CBC for PLT  COPD (chronic obstructive pulmonary disease) (HCC) SNF - stable;cont anora ellipta and prn albuterol  Hyperlipidemia SNF - not stated as uncontrolled;cont lipitor 40 mg daily  Fungal infection of foot SNF - Lac-Hydrin lotion for week and then add a fungal cream to the feet after that.-nystatin cream BID    Time spent > 45 min;> 50% of time with patient was spent reviewing  records, labs, tests and studies, counseling and developing plan of care  Margit Hanks, MD

## 2016-01-30 ENCOUNTER — Encounter: Payer: Self-pay | Admitting: Internal Medicine

## 2016-01-30 DIAGNOSIS — E785 Hyperlipidemia, unspecified: Secondary | ICD-10-CM | POA: Insufficient documentation

## 2016-01-30 DIAGNOSIS — B353 Tinea pedis: Secondary | ICD-10-CM | POA: Insufficient documentation

## 2016-01-30 DIAGNOSIS — D7582 Heparin induced thrombocytopenia (HIT): Secondary | ICD-10-CM | POA: Insufficient documentation

## 2016-01-30 DIAGNOSIS — J449 Chronic obstructive pulmonary disease, unspecified: Secondary | ICD-10-CM | POA: Insufficient documentation

## 2016-01-30 DIAGNOSIS — E119 Type 2 diabetes mellitus without complications: Secondary | ICD-10-CM | POA: Insufficient documentation

## 2016-01-30 DIAGNOSIS — D75829 Heparin-induced thrombocytopenia, unspecified: Secondary | ICD-10-CM | POA: Insufficient documentation

## 2016-01-30 NOTE — Assessment & Plan Note (Signed)
SNF - Lac-Hydrin lotion for week and then add a fungal cream to the feet after that.-nystatin cream BID

## 2016-01-30 NOTE — Assessment & Plan Note (Signed)
SNF - stable;cont anora ellipta and prn albuterol

## 2016-01-30 NOTE — Assessment & Plan Note (Signed)
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 Patient is status post debridement with partial removal of infected fifth metatarsal. Patient has a wound VAC on as per podiatry. Infectious disease specialist changed recommendations to a total of 4 weeks of Cipro. I wrote a prescription for the completion of course through March 2. Social worker did get a rehabilitation bed for the wound VAC and this likely will be needed for 4 weeks also. Follow-up with podiatry and infectious disease 2 weeks. SNF - Cipro and wound vac care for 4 weeks; OT/PT to mobilize; pt very anxious to get home

## 2016-01-30 NOTE — Assessment & Plan Note (Signed)
SNF - cont insulin and titrate as needed;pt on ACE and statin

## 2016-01-30 NOTE — Assessment & Plan Note (Addendum)
.   Lovenox was stopped. Pharmacist sent off a specialty tests. Platelet count recovered 259,000. It was as low as 76,000. I will use Arixtra injection for DVT prophylaxis until more ambulatory  SNF - will continue arixtra until ortho says stop, prob 4-6 weeks; will follow with CBC for PLT

## 2016-01-30 NOTE — Assessment & Plan Note (Addendum)
status post angiogram 131 with PTCA to the right superficial femoral artery SNF - cont statin, lipitor

## 2016-01-30 NOTE — Assessment & Plan Note (Signed)
SNF - not stated as uncontrolled;cont lipitor 40 mg daily 

## 2016-02-01 LAB — BASIC METABOLIC PANEL
BUN: 11 mg/dL (ref 4–21)
CREATININE: 0.7 mg/dL (ref 0.6–1.3)
GLUCOSE: 123 mg/dL
POTASSIUM: 3.7 mmol/L (ref 3.4–5.3)
Sodium: 138 mmol/L (ref 137–147)

## 2016-02-01 LAB — CBC AND DIFFERENTIAL
HCT: 44 % (ref 41–53)
Hemoglobin: 13.7 g/dL (ref 13.5–17.5)
Platelets: 218 10*3/uL (ref 150–399)
WBC: 7.1 10*3/mL

## 2016-02-10 ENCOUNTER — Encounter: Payer: Self-pay | Admitting: Adult Health

## 2016-02-10 ENCOUNTER — Non-Acute Institutional Stay (SKILLED_NURSING_FACILITY): Payer: Medicaid Other | Admitting: Adult Health

## 2016-02-10 DIAGNOSIS — E1151 Type 2 diabetes mellitus with diabetic peripheral angiopathy without gangrene: Secondary | ICD-10-CM

## 2016-02-10 NOTE — Progress Notes (Signed)
Patient ID: Dennis Fitzgerald, male   DOB: May 16, 1956, 60 y.o.   MRN: [id number]   Facility: [facility]       No Known Allergies  Chief Complaint  Patient presents with  . Acute Visit    Diabetes    HPI:  His cbg's are elevated. He states he had better response using novolog mix; and took metformin. He is not voicing any complaints at this time.    Past Medical History  Diagnosis Date  . Diabetes mellitus without complication (HCC)   . Lymphedema   . COPD (chronic obstructive pulmonary disease) (HCC)   . Hyperlipidemia   . PVD (peripheral vascular disease) [facility])     Past Surgical History  Procedure Laterality Date  . Incision and drainage of wound Right 01/16/2016    Procedure: IRRIGATION AND DEBRIDEMENT WOUND;  Surgeon: Recardo Evangelist, DPM;  Location: [facility] ORS;  Service: Podiatry;  Laterality: Right;  . Application of wound vac Right 01/16/2016    Procedure: APPLICATION OF WOUND VAC;  Surgeon: Recardo Evangelist, DPM;  Location: [facility] OR[facility];  Service: Podiatry;  Laterality: Right;  . Irrigation and debridement foot Right 01/21/2016    Procedure: IRRIGATION AND DEBRIDEMENT FOOT;  Surgeon: Recardo Evangelist, DPM;  Location: [facility] OR[facility];  Service: Podiatry;  Laterality: Right;  . Peripheral vascular catheterization Right 01/20/2016    Procedure: Lower Extremity Angiography;  Surgeon: Renford Dills, MD;  Location: [facility];  Service: Cardiovascular;  Laterality: Right;  . Peripheral vascular catheterization Right 01/20/2016    Procedure: Lower Extremity Intervention;  Surgeon: Renford Dills, MD;  Location: [facility];  Service: Cardiovascular;  Laterality: Right;    VITAL SIGNS BP 135/70 mmHg  Pulse 68  Temp(Src) 96.5 F (35.8 C) (Oral)  Resp 20  Ht  (1.651 m)  Wt 240 lb (108.863 kg)  BMI 39.94 kg/m2  SpO2 98%  Patient's Medications  New Prescriptions   No medications on file  Previous Medications   ALBUTEROL (PROVENTIL HFA;VENTOLIN HFA) 108 (90 BASE)  MCG/ACT INHALER    Inhale 2 puffs into the lungs every 4 (four) hours as needed for wheezing or shortness of breath.   AMMONIUM LACTATE (LAC-HYDRIN) 12 % LOTION    Apply topically 2 (two) times daily.   ASPIRIN EC 81 MG TABLET    Take 81 mg by mouth every evening.   ATORVASTATIN (LIPITOR) 40 MG TABLET    Take 40 mg by mouth every evening.   CIPROFLOXACIN (CIPRO) 500 MG TABLET    Twice a day for 23 more days the stop   CLOPIDOGREL (PLAVIX) 75 MG TABLET    Take 75 mg by mouth every evening.   FONDAPARINUX (ARIXTRA) 2.5 MG/0.5ML SOLN INJECTION    contniue until ambulatory to prevent dvt   FUROSEMIDE (LASIX) 40 MG TABLET    Take 40 mg by mouth 2 (two) times daily.   INSULIN ASPART (NOVOLOG) 100 UNIT/ML INJECTION    Inject 5 Units into the skin 3 (three) times daily with meals.   INSULIN ASPART PROTAMINE- ASPART (NOVOLOG MIX 70/30) (70-30) 100 UNIT/ML INJECTION    Inject 0.2 mLs (20 Units total) into the skin daily with supper.   LISINOPRIL (PRINIVIL,ZESTRIL) 10 MG TABLET    Take 10 mg by mouth every evening.   MAGIC MOUTHWASH SOLN    Take 10 mLs by mouth 3 (three) times daily as needed for mouth pain.   OXYCODONE (OXY IR/ROXICODONE) 5 MG IMMEDIATE RELEASE TABLET    Take 1-2 tablets (  5-10 mg total) by mouth every 4 (four) hours as needed for moderate pain.   POTASSIUM CHLORIDE (K-DUR,KLOR-CON) 10 MEQ TABLET    Take 10 mEq by mouth every evening.   SENNA (SENOKOT) 8.6 MG TABS TABLET    Take 1 tablet (8.6 mg total) by mouth daily.   UMECLIDINIUM-VILANTEROL (ANORO ELLIPTA) 62.5-25 MCG/INH AEPB    Inhale 1 puff into the lungs every evening.   Modified Medications   No medications on file  Discontinued Medications   No medications on file     SIGNIFICANT DIAGNOSTIC EXAMS    LAB REVIEWED:   01-26-16: wbc 5.1; hgb 12.6; hct 37.2; mcv 82.1; plt 159; glucose 107; bun 7; creat 0.71; k+ 3.7; na++136  02-01-16: wbc 7.1; hgb 13.7; hct 44.5; mcv 84.1; plt 218; glucose 123; bun 10.6; creat 0.72; k+ 3.7; na++  138    Review of Systems  Constitutional: Negative for malaise/fatigue.  Respiratory: Negative for cough and shortness of breath.   Cardiovascular: Positive for leg swelling. Negative for chest pain and palpitations.  Gastrointestinal: Negative for heartburn, abdominal pain and constipation.  Musculoskeletal: Negative for myalgias and joint pain.  Skin:       Has chronic lymphedema  Has right diabetic foot ulceration   Psychiatric/Behavioral: The patient is not nervous/anxious.     Physical Exam  Constitutional: He is oriented to person, place, and time. He appears well-developed and well-nourished. No distress.  Obese   Eyes: Conjunctivae are normal.  Neck: Neck supple. No JVD present. No thyromegaly present.  Cardiovascular: Normal rate, regular rhythm and intact distal pulses.   Respiratory: Effort normal and breath sounds normal. No respiratory distress. He has no wheezes.  GI: Soft. Bowel sounds are normal. He exhibits no distension. There is no tenderness.  Musculoskeletal: He exhibits edema.  Able to move all extremities  Has chronic bilateral lower extremity lymphedema   Lymphadenopathy:    He has no cervical adenopathy.  Neurological: He is alert and oriented to person, place, and time.  Skin: Skin is warm and dry. He is not diaphoretic.  Right lower leg with redness is chronic in nature Right lateral foot diabetic foot ulceration will need wound vac. He is going home today.   Psychiatric: He has a normal mood and affect.     ASSESSMENT/PLAN:  1. Diabetes: will stop novolog; will increase novolog mix 70/30 to 25 units twice daily and will begin metformin ER 1 gm daily at lunch will check hgb a1c          Synthia Innocent NP [facility]  Contact [phone] Monday through Friday 8am- 5pm  After hours call [phone]

## 2016-02-11 LAB — BASIC METABOLIC PANEL
BUN: 10 mg/dL (ref 4–21)
Creatinine: 0.7 mg/dL (ref 0.6–1.3)
GLUCOSE: 189 mg/dL
Potassium: 3.8 mmol/L (ref 3.4–5.3)
SODIUM: 138 mmol/L (ref 137–147)

## 2016-02-11 LAB — CBC AND DIFFERENTIAL
HEMATOCRIT: 42 % (ref 41–53)
HEMOGLOBIN: 13.6 g/dL (ref 13.5–17.5)
Platelets: 150 10*3/uL (ref 150–399)
WBC: 8.2 10*3/mL

## 2016-02-11 LAB — HEMOGLOBIN A1C: Hemoglobin A1C: 8.4

## 2016-02-17 ENCOUNTER — Non-Acute Institutional Stay (SKILLED_NURSING_FACILITY): Payer: Medicaid Other | Admitting: Adult Health

## 2016-02-17 ENCOUNTER — Encounter: Payer: Self-pay | Admitting: Adult Health

## 2016-02-17 DIAGNOSIS — E11621 Type 2 diabetes mellitus with foot ulcer: Secondary | ICD-10-CM | POA: Diagnosis not present

## 2016-02-17 DIAGNOSIS — I890 Lymphedema, not elsewhere classified: Secondary | ICD-10-CM

## 2016-02-17 DIAGNOSIS — E785 Hyperlipidemia, unspecified: Secondary | ICD-10-CM

## 2016-02-17 DIAGNOSIS — J449 Chronic obstructive pulmonary disease, unspecified: Secondary | ICD-10-CM

## 2016-02-17 DIAGNOSIS — E1169 Type 2 diabetes mellitus with other specified complication: Secondary | ICD-10-CM | POA: Diagnosis not present

## 2016-02-17 DIAGNOSIS — L97519 Non-pressure chronic ulcer of other part of right foot with unspecified severity: Secondary | ICD-10-CM | POA: Diagnosis not present

## 2016-02-17 DIAGNOSIS — E1151 Type 2 diabetes mellitus with diabetic peripheral angiopathy without gangrene: Secondary | ICD-10-CM

## 2016-02-17 NOTE — Progress Notes (Signed)
Patient ID: Dennis Fitzgerald, male   DOB: [birth year], 60 y.o.   MRN: [id number]   Facility:  [facility]       No Known Allergies  Chief Complaint  Patient presents with  . Medical Management of Chronic Issues    Follow up    HPI:  He is a resident of this facility of this resident being seen for the management of his chronic illnesses. He continues  with his wound care. His cbg's remain elevated. He is not voicing any complaints at this time.   Past Medical History  Diagnosis Date  . Diabetes mellitus without complication (HCC)   . Lymphedema   . COPD (chronic obstructive pulmonary disease) (HCC)   . Hyperlipidemia   . PVD (peripheral vascular disease) [facility])     Past Surgical History  Procedure Laterality Date  . Incision and drainage of wound Right 01/16/2016    Procedure: IRRIGATION AND DEBRIDEMENT WOUND;  Surgeon: Recardo Evangelist, DPM;  Location: [facility] OR[facility];  Service: Podiatry;  Laterality: Right;  . Application of wound vac Right 01/16/2016    Procedure: APPLICATION OF WOUND VAC;  Surgeon: Recardo Evangelist, DPM;  Location: [facility];  Service: Podiatry;  Laterality: Right;  . Irrigation and debridement foot Right 01/21/2016    Procedure: IRRIGATION AND DEBRIDEMENT FOOT;  Surgeon: Recardo Evangelist, DPM;  Location: [facility];  Service: Podiatry;  Laterality: Right;  . Peripheral vascular catheterization Right 01/20/2016    Procedure: Lower Extremity Angiography;  Surgeon: Renford Dills, MD;  Location: [facility];  Service: Cardiovascular;  Laterality: Right;  . Peripheral vascular catheterization Right 01/20/2016    Procedure: Lower Extremity Intervention;  Surgeon: Renford Dills, MD;  Location: [facility];  Service: Cardiovascular;  Laterality: Right;    VITAL SIGNS BP 135/70 mmHg  Pulse 68  Temp(Src) 96.5 F (35.8 C) (Oral)  Resp 20  Ht  (1.651 m)  Wt 240 lb (108.863 kg)  BMI 39.94 kg/m2  SpO2 98%  Patient's Medications  New Prescriptions   No  medications on file  Previous Medications   ALBUTEROL (PROVENTIL HFA;VENTOLIN HFA) 108 (90 BASE) MCG/ACT INHALER    Inhale 2 puffs into the lungs every 4 (four) hours as needed for wheezing or shortness of breath.   AMMONIUM LACTATE (LAC-HYDRIN) 12 % LOTION    Apply topically 2 (two) times daily.   ASPIRIN EC 81 MG TABLET    Take 81 mg by mouth every evening.   ATORVASTATIN (LIPITOR) 40 MG TABLET    Take 40 mg by mouth every evening.   CIPROFLOXACIN (CIPRO) 500 MG TABLET    Twice a day for 23 more days the stop   CLOPIDOGREL (PLAVIX) 75 MG TABLET    Take 75 mg by mouth every evening.   FONDAPARINUX (ARIXTRA) 2.5 MG/0.5ML SOLN INJECTION    contniue until ambulatory to prevent dvt   FUROSEMIDE (LASIX) 40 MG TABLET    Take 40 mg by mouth 2 (two) times daily.   INSULIN ASPART PROTAMINE- ASPART (NOVOLOG MIX 70/30) (70-30) 100 UNIT/ML INJECTION    Inject 30 Units total) into the skin daily with supper.   LISINOPRIL (PRINIVIL,ZESTRIL) 10 MG TABLET    Take 10 mg by mouth every evening.   METFORMIN (GLUMETZA) 1000 MG (MOD) 24 HR TABLET    Take 1,000 mg by mouth daily after lunch.   OXYCODONE (OXY IR/ROXICODONE) 5 MG IMMEDIATE RELEASEKelii Chittum  Take 1-2 tablets (5-10 mg total) by mouth every 4 (four)  hours as needed for moderate pain.   POTASSIUM CHLORIDE (K-DUR,KLOR-CON) 10 MEQ TABLET    Take 10 mEq by mouth every evening.   SENNA (SENOKOT) 8.6 MG TABS TABLET    Take 1 tablet (8.6 mg total) by mouth daily.   UMECLIDINIUM-VILANTEROL (ANORO ELLIPTA) 62.5-25 MCG/INH AEPB    Inhale 1 puff into the lungs every evening.    VITAMIN C (ASCORBIC ACID) 500 MG TABLET    Take 500 mg by mouth 2 (two) times daily.   ZINC SULFATE (ZINC-220 PO)    Take 220 mg by mouth daily. X 45 days  Modified Medications   No medications on file  Discontinued Medications     SIGNIFICANT DIAGNOSTIC EXAMS   LAB REVIEWED:   01-26-16: wbc 5.1; hgb 12.6; hct 37.2; mcv 82.1; plt 159; glucose 107; bun 7; creat 0.71; k+ 3.7; na++136    02-01-16: wbc 7.1; hgb 13.7; hct 44.5; mcv 84.1; plt 218; glucose 123; bun 10.6; creat 0.72; k+ 3.7; na++ 138    Review of Systems  Constitutional: Negative for malaise/fatigue.  Respiratory: Negative for cough and shortness of breath.   Cardiovascular: Positive for leg swelling. Negative for chest pain and palpitations.  Gastrointestinal: Negative for heartburn, abdominal pain and constipation.  Musculoskeletal: Negative for myalgias and joint pain.  Skin:       Has chronic lymphedema  Has right diabetic foot ulceration   Psychiatric/Behavioral: The patient is not nervous/anxious.     Physical Exam  Constitutional: He is oriented to person, place, and time. He appears well-developed and well-nourished. No distress.  Obese   Eyes: Conjunctivae are normal.  Neck: Neck supple. No JVD present. No thyromegaly present.  Cardiovascular: Normal rate, regular rhythm and intact distal pulses.   Respiratory: Effort normal and breath sounds normal. No respiratory distress. He has no wheezes.  GI: Soft. Bowel sounds are normal. He exhibits no distension. There is no tenderness.  Musculoskeletal: He exhibits edema.  Able to move all extremities  Has chronic bilateral lower extremity lymphedema   Lymphadenopathy:    He has no cervical adenopathy.  Neurological: He is alert and oriented to person, place, and time.  Skin: Skin is warm and dry. He is not diaphoretic.  Right lower leg with redness is chronic in nature Right lateral foot diabetic foot ulceration with  wound vac.   Psychiatric: He has a normal mood and affect.      ASSESSMENT/PLAN:  1. Diabetes: will increase his novolog mix to 35 units twice daily will continue metformin 1 gm daily and will monitor his status.   2. Dyslipidemia: will continue lipitor 40 mg daily   3. Bilateral lower extremity edema: will conitnue lasix 40 mg twice daily with k+ 10 meq daily    4. COPD: will continue anora ellipa daily; albuterol 2 puffs  every 4 hours as needed  5. Diabetic foot ulceration: will continue  with wound vac will continue cipro 500 mg twice daily until complete and will monitor has percocet 5/325 mg 1 or 2 tabs every 4 hours as needed for pain.             Synthia Innocent NP [facility]  Contact [phone] Monday through Friday 8am- 5pm  After hours call [phone]

## 2016-02-24 ENCOUNTER — Non-Acute Institutional Stay (SKILLED_NURSING_FACILITY): Payer: Medicaid Other | Admitting: Adult Health

## 2016-02-24 ENCOUNTER — Encounter: Payer: Self-pay | Admitting: Adult Health

## 2016-02-24 DIAGNOSIS — E11621 Type 2 diabetes mellitus with foot ulcer: Secondary | ICD-10-CM

## 2016-02-24 DIAGNOSIS — E785 Hyperlipidemia, unspecified: Secondary | ICD-10-CM

## 2016-02-24 DIAGNOSIS — E1169 Type 2 diabetes mellitus with other specified complication: Secondary | ICD-10-CM | POA: Diagnosis not present

## 2016-02-24 DIAGNOSIS — L97519 Non-pressure chronic ulcer of other part of right foot with unspecified severity: Secondary | ICD-10-CM

## 2016-02-24 DIAGNOSIS — E1151 Type 2 diabetes mellitus with diabetic peripheral angiopathy without gangrene: Secondary | ICD-10-CM | POA: Diagnosis not present

## 2016-02-24 DIAGNOSIS — J449 Chronic obstructive pulmonary disease, unspecified: Secondary | ICD-10-CM

## 2016-02-24 NOTE — Progress Notes (Signed)
Patient ID: Dennis Fitzgerald, male   DOB: [birth year], 60 y.o.   MRN: [id number]   Facility:  [facility]       No Known Allergies  Chief Complaint  Patient presents with  . Discharge Note    Discharge from facility    HPI:  He is being discharged to home with home health for pt/ot/rn/cna. He will not need dme. He tells me that he has all necessary dme at home. He will need his prescriptions to be written for him. Dr. Sampson GoonFitzgerald has written for his oxycodone tabs. He will need follow up setup for him.    Past Medical History  Diagnosis Date  . Diabetes mellitus without complication (HCC)   . Lymphedema   . COPD (chronic obstructive pulmonary disease) (HCC)   . Hyperlipidemia   . PVD (peripheral vascular disease) [facility](HCC)     Past Surgical History  Procedure Laterality Date  . Incision and drainage of wound Right 01/16/2016    Procedure: IRRIGATION AND DEBRIDEMENT WOUND;  Surgeon: Recardo EvangelistMatthew Troxler, DPM;  Location: [facility];  Service: Podiatry;  Laterality: Right;  . Application of wound vac Right 01/16/2016    Procedure: APPLICATION OF WOUND VAC;  Surgeon: Recardo EvangelistMatthew Troxler, DPM;  Location: [facility];  Service: Podiatry;  Laterality: Right;  . Irrigation and debridement foot Right 01/21/2016    Procedure: IRRIGATION AND DEBRIDEMENT FOOT;  Surgeon: Recardo EvangelistMatthew Troxler, DPM;  Location: [facility];  Service: Podiatry;  Laterality: Right;  . Peripheral vascular catheterization Right 01/20/2016    Procedure: Lower Extremity Angiography;  Surgeon: Renford DillsGregory G Schnier, MD;  Location: [facility];  Service: Cardiovascular;  Laterality: Right;  . Peripheral vascular catheterization Right 01/20/2016    Procedure: Lower Extremity Intervention;  Surgeon: Renford DillsGregory G Schnier, MD;  Location: [facility];  Service: Cardiovascular;  Laterality: Right;    VITAL SIGNS BP 114/74 mmHg  Pulse 77  Temp(Src) 98.4 F (36.9 C) (Oral)  Resp 16  Ht 5\' 5"  (1.651 m)  Wt 240 lb (108.863 kg)  BMI 39.94 kg/m2   SpO2 98%  Patient's Medications  New Prescriptions   No medications on file  Previous Medications   ALBUTEROL (PROVENTIL HFA;VENTOLIN HFA) 108 (90 BASE) MCG/ACT INHALER    Inhale 2 puffs into the lungs every 4 (four) hours as needed for wheezing or shortness of breath.   AMMONIUM LACTATE (LAC-HYDRIN) 12 % LOTION    Apply topically 2 (two) times daily.   ASPIRIN EC 81 MG TABLET    Take 81 mg by mouth every evening.   ATORVASTATIN (LIPITOR) 40 MG TABLET    Take 40 mg by mouth every evening.   CLOPIDOGREL (PLAVIX) 75 MG TABLET    Take 75 mg by mouth every evening.   FONDAPARINUX (ARIXTRA) 2.5 MG/0.5ML SOLN INJECTION    contniue until ambulatory to prevent dvt   FUROSEMIDE (LASIX) 40 MG TABLET    Take 40 mg by mouth 2 (two) times daily.   HYDROCORTISONE CREAM 1 %    Apply 1 application topically daily. Apply to stomach and back for dry skin.   INSULIN ASPART PROTAMINE- ASPART (NOVOLOG MIX 70/30) (70-30) 100 UNIT/ML INJECTION    Inject 35 Units into the skin 2 (two) times daily.   LISINOPRIL (PRINIVIL,ZESTRIL) 10 MG TABLET    Take 10 mg by mouth every evening.   METFORMIN (GLUMETZA) 1000 MG (MOD) 24 HR TABLET    Take 1,000 mg by mouth daily after lunch.   OXYCODONE (OXY IR/ROXICODONE) 5 MG IMMEDIATE  RELEASE TABLET    Take 5 mg by mouth every 6 (six) hours as needed for severe pain.   POTASSIUM CHLORIDE (K-DUR,KLOR-CON) 10 MEQ TABLET    Take 10 mEq by mouth every evening.   SENNA (SENOKOT) 8.6 MG TABS TABLET    Take 1 tablet (8.6 mg total) by mouth daily.   UMECLIDINIUM-VILANTEROL (ANORO ELLIPTA) 62.5-25 MCG/INH AEPB    Inhale 1 puff into the lungs every evening.    VITAMIN C (ASCORBIC ACID) 500 MG TABLET    Take 500 mg by mouth 2 (two) times daily.   ZINC SULFATE (ZINC-220 PO)    Take 220 mg by mouth daily. X 45 days  Modified Medications   No medications on file  Discontinued Medications   CIPROFLOXACIN (CIPRO) 500 MG TABLET    Twice a day for 23 more days the stop   INSULIN ASPART PROTAMINE-  ASPART (NOVOLOG MIX 70/30) (70-30) 100 UNIT/ML INJECTION    Inject 0.2 mLs (20 Units total) into the skin daily with supper.   OXYCODONE (OXY IR/ROXICODONE) 5 MG IMMEDIATE RELEASE TABLET    Take 1-2 tablets (5-10 mg total) by mouth every 4 (four) hours as needed for moderate pain.     SIGNIFICANT DIAGNOSTIC EXAMS  LAB REVIEWED:   01-26-16: wbc 5.1; hgb 12.6; hct 37.2; mcv 82.1; plt 159; glucose 107; bun 7; creat 0.71; k+ 3.7; na++136  02-11-16: wbc 8.2; hgb 13.6; hct 41.1; mcv 82.4 plt 150; glucose 189; bun 10.5; creat 0.71; k+ 3.8; na++138 hgb a1c 8.4    Review of Systems  Constitutional: Negative for malaise/fatigue.  Respiratory: Negative for cough and shortness of breath.   Cardiovascular: Positive for leg swelling. Negative for chest pain and palpitations.  Gastrointestinal: Negative for heartburn, abdominal pain and constipation.  Musculoskeletal: Negative for myalgias and joint pain.  Skin:       Has chronic lymphedema  Has right diabetic foot ulceration   Psychiatric/Behavioral: The patient is not nervous/anxious.     Physical Exam  Constitutional: He is oriented to person, place, and time. He appears well-developed and well-nourished. No distress.  Obese   Eyes: Conjunctivae are normal.  Neck: Neck supple. No JVD present. No thyromegaly present.  Cardiovascular: Normal rate, regular rhythm and intact distal pulses.   Respiratory: Effort normal and breath sounds normal. No respiratory distress. He has no wheezes.  GI: Soft. Bowel sounds are normal. He exhibits no distension. There is no tenderness.  Musculoskeletal: He exhibits edema.  Able to move all extremities  Has chronic bilateral lower extremity lymphedema   Lymphadenopathy:    He has no cervical adenopathy.  Neurological: He is alert and oriented to person, place, and time.  Skin: Skin is warm and dry. He is not diaphoretic.  Right lower leg with redness is chronic in nature Right lateral foot diabetic foot  ulceration will need wound vac. He is going home today.   Psychiatric: He has a normal mood and affect.      ASSESSMENT/ PLAN:  Will discharge him to home. He will need home health for pt/ot/rn/cna to evaluate and treat as indicated for gait balance adl care wound care and adl training. E does not need dme; he has necessary equipment. His prescriptions have been written for a 30 day supply of his medications; Dr. Sampson Goon has written for his oxycodone. The facility will setup a follow up appointment for his in the next 1-2 weeks.    Time spent with patient  50   minutes >50%  time spent counseling; reviewing medical record; tests; labs; and developing future plan of care            Synthia Innocent NP [facility]  Contact [phone] Monday through Friday 8am- 5pm  After hours call [phone]

## 2017-02-16 ENCOUNTER — Ambulatory Visit (INDEPENDENT_AMBULATORY_CARE_PROVIDER_SITE_OTHER): Payer: Medicaid Other | Admitting: Vascular Surgery

## 2017-02-16 ENCOUNTER — Encounter (INDEPENDENT_AMBULATORY_CARE_PROVIDER_SITE_OTHER): Payer: Medicaid Other

## 2017-02-20 ENCOUNTER — Encounter (INDEPENDENT_AMBULATORY_CARE_PROVIDER_SITE_OTHER): Payer: Medicaid Other

## 2017-02-20 ENCOUNTER — Ambulatory Visit (INDEPENDENT_AMBULATORY_CARE_PROVIDER_SITE_OTHER): Payer: Medicaid Other | Admitting: Vascular Surgery

## 2017-03-06 ENCOUNTER — Other Ambulatory Visit (INDEPENDENT_AMBULATORY_CARE_PROVIDER_SITE_OTHER): Payer: Self-pay | Admitting: Vascular Surgery

## 2017-03-06 DIAGNOSIS — I739 Peripheral vascular disease, unspecified: Secondary | ICD-10-CM

## 2017-03-08 ENCOUNTER — Other Ambulatory Visit (INDEPENDENT_AMBULATORY_CARE_PROVIDER_SITE_OTHER): Payer: Medicaid Other

## 2017-03-08 ENCOUNTER — Encounter (INDEPENDENT_AMBULATORY_CARE_PROVIDER_SITE_OTHER): Payer: Self-pay | Admitting: Vascular Surgery

## 2017-03-08 ENCOUNTER — Ambulatory Visit (INDEPENDENT_AMBULATORY_CARE_PROVIDER_SITE_OTHER): Payer: Medicaid Other | Admitting: Vascular Surgery

## 2017-03-08 ENCOUNTER — Encounter (INDEPENDENT_AMBULATORY_CARE_PROVIDER_SITE_OTHER): Payer: Medicaid Other

## 2017-03-08 ENCOUNTER — Encounter (INDEPENDENT_AMBULATORY_CARE_PROVIDER_SITE_OTHER): Payer: Self-pay

## 2017-03-08 VITALS — BP 140/76 | HR 105 | Resp 17 | Ht 71.0 in | Wt 278.0 lb

## 2017-03-08 DIAGNOSIS — I739 Peripheral vascular disease, unspecified: Secondary | ICD-10-CM

## 2017-03-08 DIAGNOSIS — E785 Hyperlipidemia, unspecified: Secondary | ICD-10-CM

## 2017-03-08 DIAGNOSIS — E1151 Type 2 diabetes mellitus with diabetic peripheral angiopathy without gangrene: Secondary | ICD-10-CM

## 2017-03-20 DIAGNOSIS — I739 Peripheral vascular disease, unspecified: Secondary | ICD-10-CM | POA: Insufficient documentation

## 2017-03-20 NOTE — Progress Notes (Signed)
Subjective:    Patient ID: Dennis Fitzgerald, male    DOB: 08-Jul-1956, 61 y.o.   MRN: [id number] Chief Complaint  Patient presents with  . Follow-up   Patient presents for a six month PAD follow up. The patient underwent an ABI which showed Right ABI: 0.87 and Left 0.95 (on 08/17/16, Right ABI: 0.85 and Left 0.90). The patient denies any claudication like symptoms, rest pain or ulcers to his feet / toes.    Review of Systems  All other systems reviewed and are negative.     Objective:   Physical Exam  Constitutional: He is oriented to person, place, and time. He appears well-developed and well-nourished. No distress.  HENT:  Head: Normocephalic and atraumatic.  Eyes: Conjunctivae are normal. Pupils are equal, round, and reactive to light.  Neck: Normal range of motion.  Cardiovascular: Normal rate, regular rhythm, normal heart sounds and intact distal pulses.   Pulses:      Radial pulses are 2+ on the right side, and 2+ on the left side.       Dorsalis pedis pulses are 1+ on the right side, and 1+ on the left side.       Posterior tibial pulses are 1+ on the right side, and 1+ on the left side.  Pulmonary/Chest: Effort normal.  Musculoskeletal: Normal range of motion. He exhibits edema.  Neurological: He is alert and oriented to person, place, and time.  Skin: Skin is warm and dry. He is not diaphoretic.  Psychiatric: He has a normal mood and affect. His behavior is normal. Judgment and thought content normal.   BP 140/76   Pulse (!) 105   Resp 17   Ht  (1.803 m)   Wt 278 lb (126.1 kg)   BMI 38.77 kg/m   Past Medical History:  Diagnosis Date  . COPD (chronic obstructive pulmonary disease) (HCC)   . Diabetes mellitus without complication (HCC)   . Hyperlipidemia   . Lymphedema   . PVD (peripheral vascular disease) (HCC)    Social History   Social History  . Marital status: Single    Spouse name: N/A  . Number of children: N/A  . Years of education: N/A    Occupational History  . Not on file.   Social History Main Topics  . Smoking status: Former Games developer  . Smokeless tobacco: Never Used  . Alcohol use No  . Drug use: No  . Sexual activity: Not on file   Other Topics Concern  . Not on file   Social History Narrative  . No narrative on file   Past Surgical History:  Procedure Laterality Date  . APPLICATION OF WOUND VAC Right 01/16/2016   Procedure: APPLICATION OF WOUND VAC;  Surgeon: Recardo Evangelist, DPM;  Location: [facility];  Service: Podiatry;  Laterality: Right;  . INCISION AND DRAINAGE OF WOUND Right 01/16/2016   Procedure: IRRIGATION AND DEBRIDEMENT WOUND;  Surgeon: Recardo Evangelist, DPM;  Location: [facility];  Service: Podiatry;  Laterality: Right;  . IRRIGATION AND DEBRIDEMENT FOOT Right 01/21/2016   Procedure: IRRIGATION AND DEBRIDEMENT FOOT;  Surgeon: Recardo Evangelist, DPM;  Location: [facility];  Service: Podiatry;  Laterality: Right;  . PERIPHERAL VASCULAR CATHETERIZATION Right 01/20/2016   Procedure: Lower Extremity Angiography;  Surgeon: Renford Dills, MD;  Location: [facility];  Service: Cardiovascular;  Laterality: Right;  . PERIPHERAL VASCULAR CATHETERIZATION Right 01/20/2016   Procedure: Lower Extremity Intervention;  Surgeon: Renford Dills, MD;  Location: [facility] INVASIVE CV  [facility];  Service: Cardiovascular;  Laterality: Right;   History reviewed. No pertinent family history.  No Known Allergies     Assessment & Plan:  Patient presents for a six month PAD follow up. The patient underwent an ABI which showed Right ABI: 0.87 and Left 0.95 (on 08/17/16, Right ABI: 0.85 and Left 0.90). The patient denies any claudication like symptoms, rest pain or ulcers to his feet / toes.   1. PAD (peripheral artery disease) (HCC) - Stable Patient with stable ABI and symptomatic at this time. He has a history of feet ulcerations.  No ulceration at this time.  Due to his numerous PAD risk factors would recommend six month follow  up  - VAS [place] ABI WITH/WO TBI; Future  2. Diabetes mellitus type 2 with peripheral artery disease (HCC) - Stable Encouraged good control as its slows the progression of atherosclerotic disease  3. Hyperlipidemia, unspecified hyperlipidemia type - Stable Encouraged good control as its slows the progression of atherosclerotic disease  Current Outpatient Prescriptions on File Prior to Visit  Medication Sig Dispense Refill  . albuterol (PROVENTIL HFA;VENTOLIN HFA) 108 (90 Base) MCG/ACT inhaler Inhale 2 puffs into the lungs every 4 (four) hours as needed for wheezing or shortness of breath.    Marland Kitchen ammonium lactate (LAC-HYDRIN) 12 % lotion Apply topically 2 (two) times daily. 400 g 0  . aspirin EC 81 MG tablet Take 81 mg by mouth every evening.    Marland Kitchen atorvastatin (LIPITOR) 40 MG tablet Take 40 mg by mouth every evening.    . clopidogrel (PLAVIX) 75 MG tablet Take 75 mg by mouth every evening.    . fondaparinux (ARIXTRA) 2.5 MG/0.5ML SOLN injection contniue until ambulatory to prevent dvt 3.5 mL 0  . furosemide (LASIX) 40 MG tablet Take 40 mg by mouth 2 (two) times daily.    . hydrocortisone cream 1 % Apply 1 application topically daily. Apply to stomach and back for dry skin.    Marland Kitchen insulin aspart protamine- aspart (NOVOLOG MIX 70/30) (70-30) 100 UNIT/ML injection Inject 35 Units into the skin 2 (two) times daily.    Marland Kitchen lisinopril (PRINIVIL,ZESTRIL) 10 MG tablet Take 10 mg by mouth every evening.    . metFORMIN (GLUMETZA) 1000 MG (MOD) 24 hr tablet Take 1,000 mg by mouth daily after lunch.    . oxyCODONE (OXY IR/ROXICODONE) 5 MG immediate release tablet Take 5 mg by mouth every 6 (six) hours as needed for severe pain.    . potassium chloride (K-DUR,KLOR-CON) 10 MEQ tablet Take 10 mEq by mouth every evening.    . senna (SENOKOT) 8.6 MG TABS tablet Take 1 tablet (8.6 mg total) by mouth daily. 30 each 0  . Umeclidinium-Vilanterol (ANORO ELLIPTA) 62.5-25 MCG/INH AEPB Inhale 1 puff into the lungs every  evening.     . vitamin C (ASCORBIC ACID) 500 MG tablet Take 500 mg by mouth 2 (two) times daily.    . Zinc Sulfate (ZINC-220 PO) Take 220 mg by mouth daily. X 45 days     No current facility-administered medications on file prior to visit.     There are no Patient Instructions on file for this visit. No Follow-up on file.   Nakeeta Sebastiani A Saman Giddens, PA-C

## 2017-06-15 ENCOUNTER — Other Ambulatory Visit: Payer: Self-pay | Admitting: Internal Medicine

## 2017-06-15 DIAGNOSIS — R911 Solitary pulmonary nodule: Secondary | ICD-10-CM

## 2017-06-28 ENCOUNTER — Ambulatory Visit
Admission: RE | Admit: 2017-06-28 | Discharge: 2017-06-28 | Disposition: A | Discharge status: Home / Self Care | Payer: Medicaid Other | Source: Ambulatory Visit | Attending: Internal Medicine | Admitting: Internal Medicine

## 2017-06-28 DIAGNOSIS — R590 Localized enlarged lymph nodes: Secondary | ICD-10-CM | POA: Diagnosis not present

## 2017-06-28 DIAGNOSIS — I700 Atherosclerosis of aorta: Secondary | ICD-10-CM | POA: Insufficient documentation

## 2017-06-28 DIAGNOSIS — Q791 Other congenital malformations of diaphragm: Secondary | ICD-10-CM | POA: Insufficient documentation

## 2017-06-28 DIAGNOSIS — J984 Other disorders of lung: Secondary | ICD-10-CM | POA: Insufficient documentation

## 2017-06-28 DIAGNOSIS — R911 Solitary pulmonary nodule: Secondary | ICD-10-CM

## 2017-06-28 DIAGNOSIS — I2510 Atherosclerotic heart disease of native coronary artery without angina pectoris: Secondary | ICD-10-CM | POA: Diagnosis not present

## 2017-06-28 LAB — POCT I-STAT CREATININE: CREATININE: 0.8 mg/dL (ref 0.61–1.24)

## 2017-06-28 MED ORDER — IOPAMIDOL (ISOVUE-300) INJECTION 61%
75.0000 mL | Freq: Once | INTRAVENOUS | Status: AC | PRN
  Administered 2017-06-28: 75 mL via INTRAVENOUS

## 2017-09-07 ENCOUNTER — Ambulatory Visit (INDEPENDENT_AMBULATORY_CARE_PROVIDER_SITE_OTHER): Payer: Medicaid Other

## 2017-09-07 ENCOUNTER — Ambulatory Visit (INDEPENDENT_AMBULATORY_CARE_PROVIDER_SITE_OTHER): Payer: Medicaid Other | Admitting: Vascular Surgery

## 2017-09-07 VITALS — BP 130/71 | HR 91 | Resp 16 | Wt 275.0 lb

## 2017-09-07 DIAGNOSIS — J449 Chronic obstructive pulmonary disease, unspecified: Secondary | ICD-10-CM | POA: Diagnosis not present

## 2017-09-07 DIAGNOSIS — E1151 Type 2 diabetes mellitus with diabetic peripheral angiopathy without gangrene: Secondary | ICD-10-CM | POA: Diagnosis not present

## 2017-09-07 DIAGNOSIS — I739 Peripheral vascular disease, unspecified: Secondary | ICD-10-CM | POA: Diagnosis not present

## 2017-09-07 NOTE — Progress Notes (Signed)
MRN : [id number]  Dennis Fitzgerald is a 61 y.o. (02/29/1956) male who presents with chief complaint of  Chief Complaint  Patient presents with  . Follow-up    66mo abi  .  History of Present Illness:   The patient returns to the office for followup and review of the noninvasive studies. In the past he had osteomyelitis of the right fifth metatarsal.  Procedure(s) Performed 01/20/2016: 1. Introduction catheter into right lower extremity 3rd order catheter placement  2. Contrast injection right lower extremity for distal runoff  3. Percutaneous transluminal angioplasty right superficial femoral artery with a 5 x 100 Lutonix balloon  There have been no interval changes in lower extremity symptoms. No interval shortening of the patient's claudication distance or development of rest pain symptoms. No new ulcers or wounds have occurred since the last visit.  There have been no significant changes to the patient's overall health care.  The patient denies amaurosis fugax or recent TIA symptoms. There are no recent neurological changes noted. The patient denies history of DVT, PE or superficial thrombophlebitis. The patient denies recent episodes of angina or shortness of breath.   ABI Rt=0.91 and Lt=1.02  (previous ABI's Rt=0.87 and Lt=0.95)   Current Meds  Medication Sig  . albuterol (PROVENTIL HFA;VENTOLIN HFA) 108 (90 Base) MCG/ACT inhaler Inhale 2 puffs into the lungs every 4 (four) hours as needed for wheezing or shortness of breath.  Marland Kitchen aspirin EC 81 MG tablet Take 81 mg by mouth every evening.  Marland Kitchen atorvastatin (LIPITOR) 40 MG tablet Take 40 mg by mouth every evening.  . clopidogrel (PLAVIX) 75 MG tablet Take 75 mg by mouth every evening.  . furosemide (LASIX) 40 MG tablet Take 40 mg by mouth 2 (two) times daily.  . hydrocortisone cream 1 % Apply 1 application topically daily. Apply to stomach and back for dry skin.  Marland Kitchen insulin aspart protamine-  aspart (NOVOLOG MIX 70/30) (70-30) 100 UNIT/ML injection Inject 35 Units into the skin 2 (two) times daily.  Marland Kitchen lisinopril (PRINIVIL,ZESTRIL) 10 MG tablet Take 10 mg by mouth every evening.  . metFORMIN (GLUMETZA) 1000 MG (MOD) 24 hr tablet Take 1,000 mg by mouth daily after lunch.  . Multiple Vitamin (MULTI VITAMIN PO) Take by mouth daily.  . potassium chloride (K-DUR,KLOR-CON) 10 MEQ tablet Take 10 mEq by mouth every evening.  . tiotropium (SPIRIVA) 18 MCG inhalation capsule Place 18 mcg into inhaler and inhale daily.    Past Medical History:  Diagnosis Date  . COPD (chronic obstructive pulmonary disease) (HCC)   . Diabetes mellitus without complication (HCC)   . Hyperlipidemia   . Hypertension   . Lymphedema   . PVD (peripheral vascular disease) (HCC)     Past Surgical History:  Procedure Laterality Date  . APPLICATION OF WOUND VAC Right 01/16/2016   Procedure: APPLICATION OF WOUND VAC;  Surgeon: Recardo Evangelist, DPM;  Location: [facility];  Service: Podiatry;  Laterality: Right;  . INCISION AND DRAINAGE OF WOUND Right 01/16/2016   Procedure: IRRIGATION AND DEBRIDEMENT WOUND;  Surgeon: Recardo Evangelist, DPM;  Location: [facility];  Service: Podiatry;  Laterality: Right;  . IRRIGATION AND DEBRIDEMENT FOOT Right 01/21/2016   Procedure: IRRIGATION AND DEBRIDEMENT FOOT;  Surgeon: Recardo Evangelist, DPM;  Location: [facility];  Service: Podiatry;  Laterality: Right;  . PERIPHERAL VASCULAR CATHETERIZATION Right 01/20/2016   Procedure: Lower Extremity Angiography;  Surgeon: Renford Dills, MD;  Location: [facility];  Service: Cardiovascular;  Laterality: Right;  .  PERIPHERAL VASCULAR CATHETERIZATION Right 01/20/2016   Procedure: Lower Extremity Intervention;  Surgeon: Renford Dills, MD;  Location: [facility];  Service: Cardiovascular;  Laterality: Right;    Social History Social History  Substance Use Topics  . Smoking status: Former Games developer  . Smokeless tobacco: Never Used    . Alcohol use No    Family History No family history on file.  No Known Allergies   REVIEW OF SYSTEMS (Negative unless checked)  Constitutional: Weight loss  Fever  Chills Cardiac: Chest pain   Chest pressure   Palpitations   Shortness of breath when laying flat   Shortness of breath with exertion. Vascular:  Pain in legs with walking   Pain in legs at rest  History of DVT   Phlebitis   Swelling in legs   Varicose veins   Non-healing ulcers Pulmonary:   Uses home oxygen   Productive cough   Hemoptysis   Wheeze  COPD   Asthma Neurologic:  Dizziness   Seizures   History of stroke   History of TIA  Aphasia   Vissual changes   Weakness or numbness in arm   Weakness or numbness in leg Musculoskeletal:   Joint swelling   Joint pain   Low back pain Hematologic:  Easy bruising  Easy bleeding   Hypercoagulable state   Anemic Gastrointestinal:  Diarrhea   Vomiting  Gastroesophageal reflux/heartburn   Difficulty swallowing. Genitourinary:  Chronic kidney disease   Difficult urination  Frequent urination   Blood in urine Skin:  Venous rashes   Ulcers  Psychological:  History of anxiety    History of major depression.  Physical Examination  Vitals:   09/07/17 0913  BP: 130/71  Pulse: 91  Resp: 16  Weight: 124.7 kg (275 lb)   Body mass index is 38.35 kg/m. Gen: WD/WN, NAD Head: [place]/AT, No temporalis wasting.  Ear/Nose/Throat: Hearing grossly intact, nares w/o erythema or drainage Eyes: PER, EOMI, sclera nonicteric.  Neck: Supple, no large masses.   Pulmonary:  Good air movement, no audible wheezing bilaterally, no use of accessory muscles.  Cardiac: RRR, no JVD Vascular: Scattered varicosities present bilaterally.  Severe venous stasis changes to the legs bilaterally.  3+ soft pitting edema Vessel Right Left  Radial Palpable Palpable  PT Not Palpable 1+ Palpable  DP Trace Palpable 1+  Palpable  Gastrointestinal: Non-distended. No guarding/no peritoneal signs.  Musculoskeletal: M/S 5/5 throughout.  No deformity or atrophy.  Neurologic: CN 2-12 intact. Symmetrical.  Speech is fluent. Motor exam as listed above. Psychiatric: Judgment intact, Mood & affect appropriate for pt's clinical situation. Dermatologic: No rashes or ulcers noted.  No changes consistent with cellulitis. Lymph : No lichenification or skin changes of chronic lymphedema.  CBC Lab Results  Component Value Date   WBC 8.2 02/11/2016   HGB 13.6 02/11/2016   HCT 42 02/11/2016   MCV 82.1 01/26/2016   PLT 150 02/11/2016    BMET    Component Value Date/Time   NA 138 02/11/2016   K 3.8 02/11/2016   CL 99 (L) 01/26/2016 0533   CO2 32 01/26/2016 0533   GLUCOSE 107 (H) 01/26/2016 0533   BUN 10 02/11/2016   CREATININE 0.80 06/28/2017 0903   CALCIUM 8.3 (L) 01/26/2016 0533   GFRNONAA >60 01/26/2016 0533   GFRAA >60 01/26/2016 0533   CrCl cannot be calculated (Patient's most recent lab result is older than the maximum 21 days allowed.).  COAG No results found for: INR, PROTIME  Radiology No results found.    Assessment/Plan 1. PAD (peripheral artery disease) (HCC)  Recommend:  The patient has evidence of atherosclerosis of the lower extremities with claudication.  The patient does not voice lifestyle limiting changes at this point in time.  Noninvasive studies do not suggest clinically significant change. ABI Rt=0.91 and Lt=1.02  (previous ABI's Rt=0.87 and Lt=0.95)  No invasive studies, angiography or surgery at this time The patient should continue walking and begin a more formal exercise program.  The patient should continue antiplatelet therapy and aggressive treatment of the lipid abnormalities  No changes in the patient's medications at this time  The patient should continue wearing graduated compression socks 10-15 mmHg strength to control the mild edema.    2. Diabetes mellitus  type 2 with peripheral artery disease (HCC) Continue hypoglycemic medications as already ordered, these medications have been reviewed and there are no changes at this time.  Hgb A1C to be monitored as already arranged by primary service   3. Chronic obstructive pulmonary disease, unspecified COPD type (HCC) Continue pulmonary medications and aerosols as already ordered, these medications have been reviewed and there are no changes at this time.    Levora Dredge, MD  09/07/2017 9:52 AM

## 2017-10-16 ENCOUNTER — Other Ambulatory Visit: Payer: Self-pay | Admitting: Internal Medicine

## 2017-10-16 DIAGNOSIS — R911 Solitary pulmonary nodule: Secondary | ICD-10-CM

## 2017-10-19 DEATH — deceased

## 2017-10-20 ENCOUNTER — Ambulatory Visit: Admission: RE | Admit: 2017-10-20 | Payer: Self-pay | Source: Ambulatory Visit

## 2017-12-24 IMAGING — CR DG FOOT COMPLETE 3+V*R*
1 series · 3 of 3 positions shown · non-contrast
Comparison: None.

CLINICAL DATA: Nonhealing wound.  Diabetes.

EXAM:
RIGHT FOOT COMPLETE - 3+ VIEW

[Series 1: dg foot complete right · 0.14mm/px · 3 of 3 slices shown]
[im 1/3]
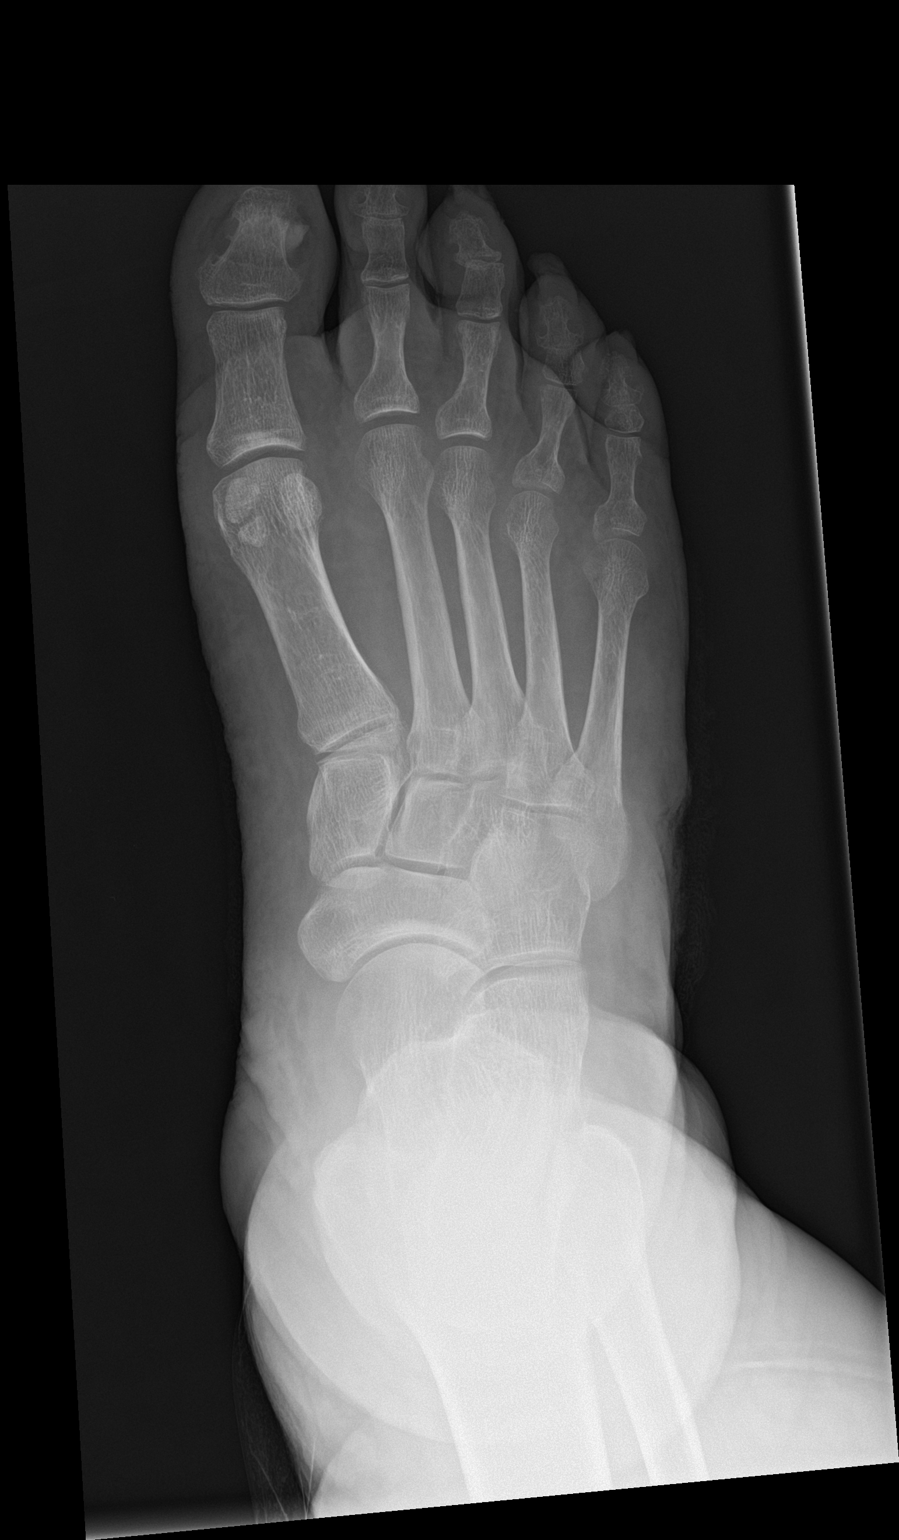
[im 2/3]
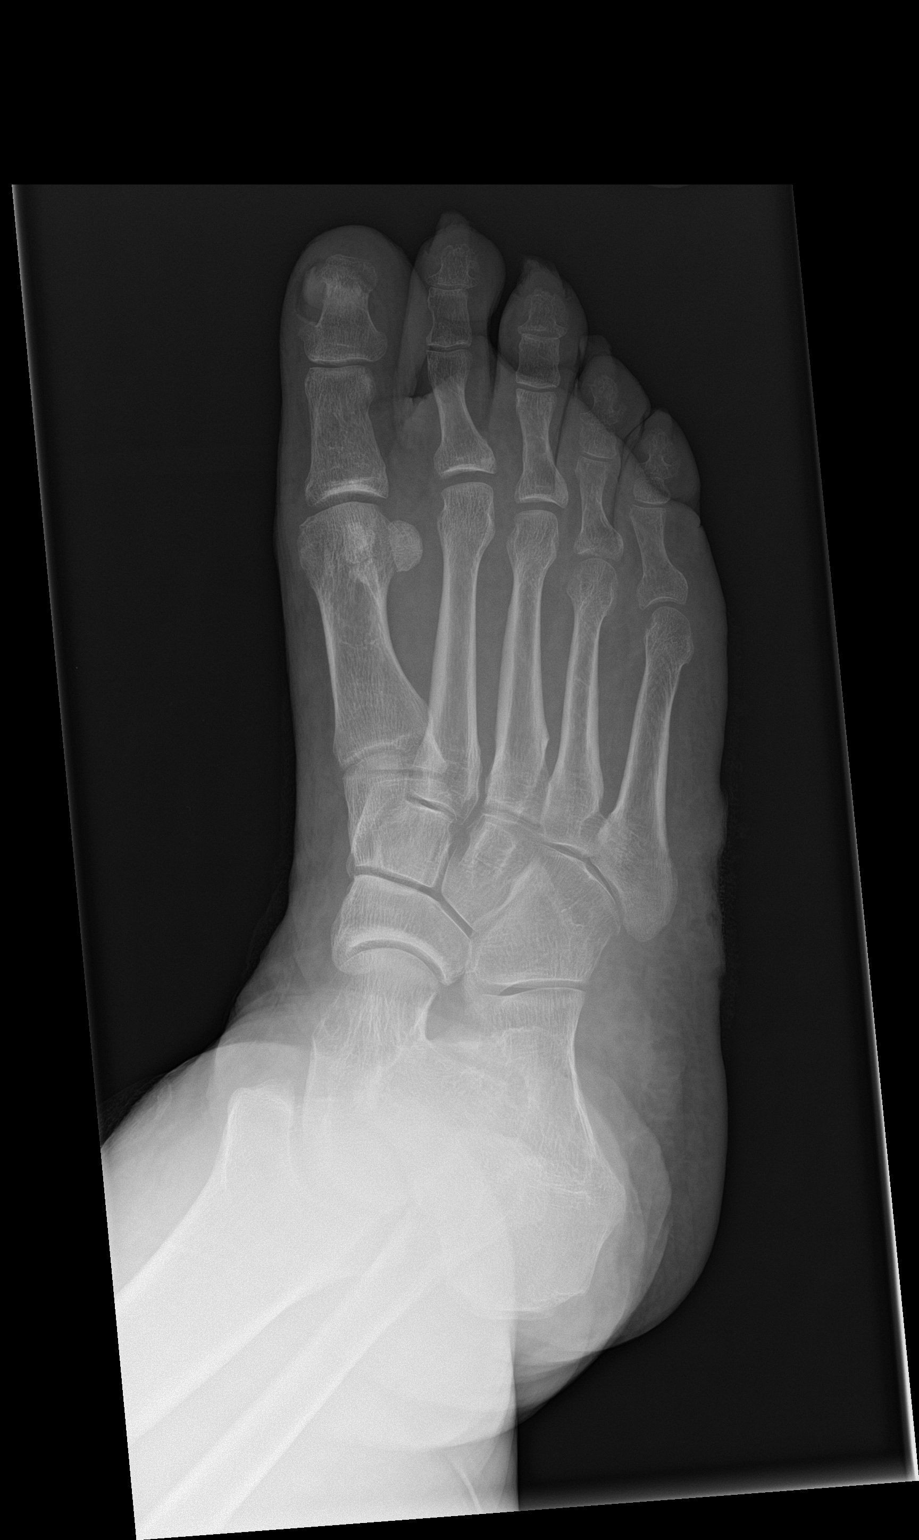
[im 3/3]
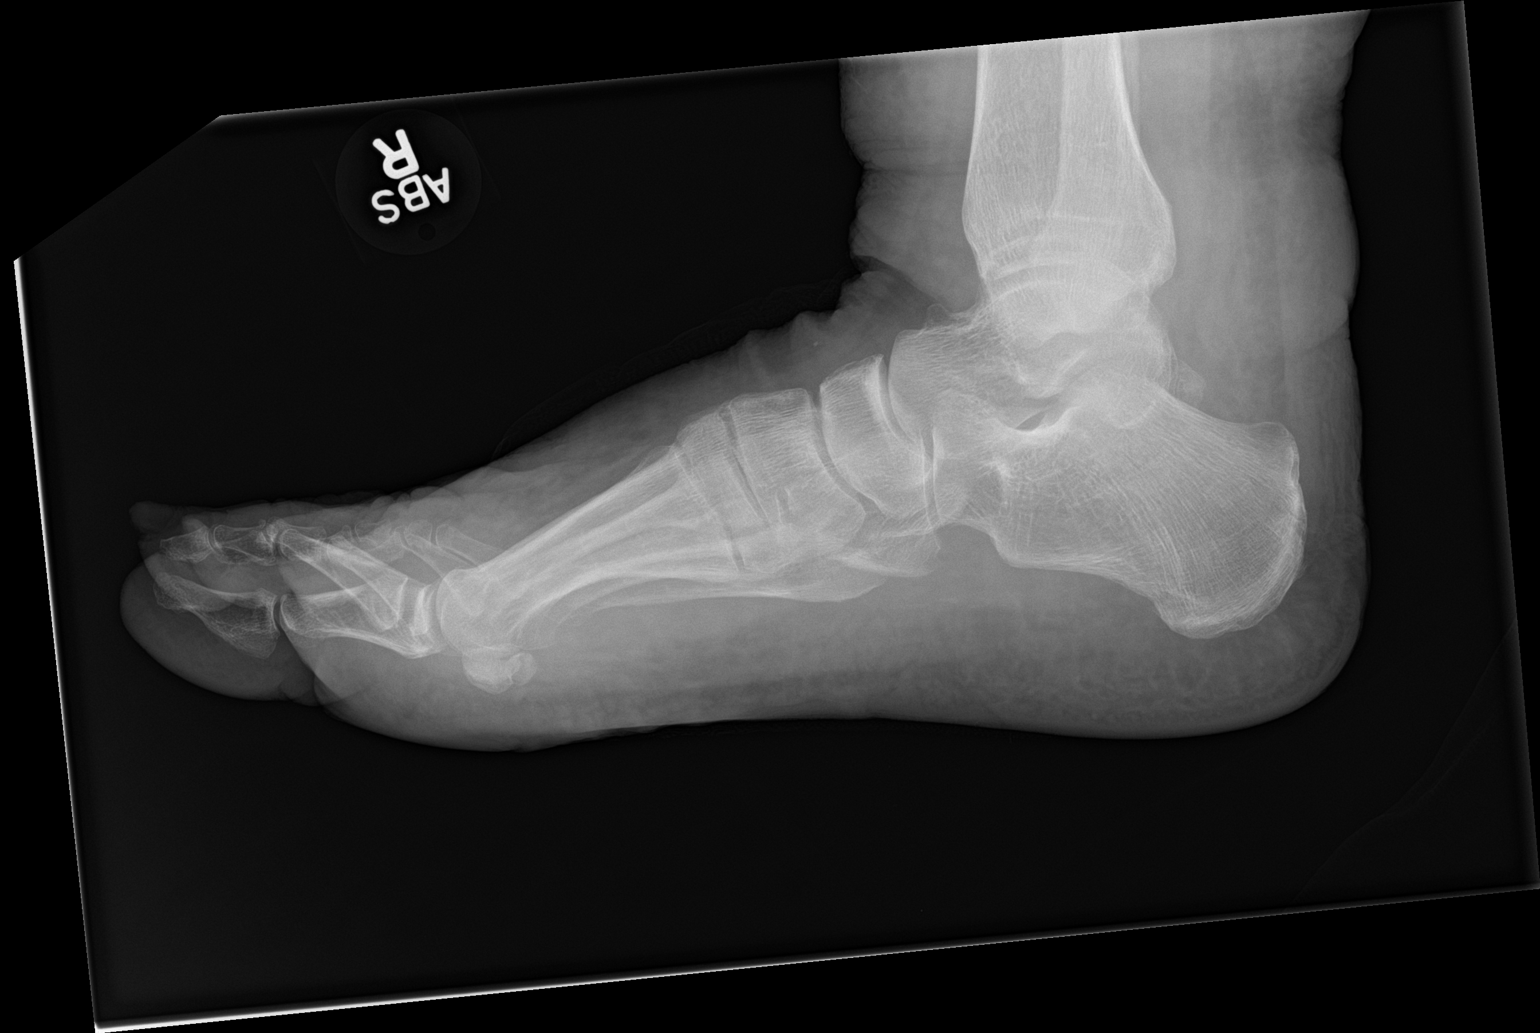

[3 of 3 positions shown; findings below may reference images not displayed]

FINDINGS: Soft tissue wound noted along the lateral aspect of the foot. No
underlying foreign body . No acute bony or joint abnormality
identified. No evidence of fracture or dislocation .
IMPRESSION: Soft tissue wound noted along the lateral aspect of foot. No
radiopaque foreign body. No acute bony abnormality. Diffuse
degenerative change.

## 2018-01-11 IMAGING — MR MR FOOT*R* WO/W CM
9 of 10 series · 33 of 40 positions shown · IV contrast (multihance)
Comparison: 01/14/2016 radiograph

CLINICAL DATA: Foot ulcer near the heel, starting in November 2015,
worsening recently. Diabetes.

EXAM:
MRI OF THE RIGHT HINDFOOT WITHOUT AND WITH CONTRAST
TECHNIQUE: Multiplanar, multisequence MR imaging was performed both before and
after administration of intravenous contrast.
CONTRAST:  20mL MULTIHANCE GADOBENATE DIMEGLUMINE 529 MG/ML IV SOLN

[Series 6: T2 fat-sat · axial · 3.0mm · 0.37mm/px · z∈[-83,+33]mm · 4 of 38 slices shown (1 of 2)]
[im 1/38]
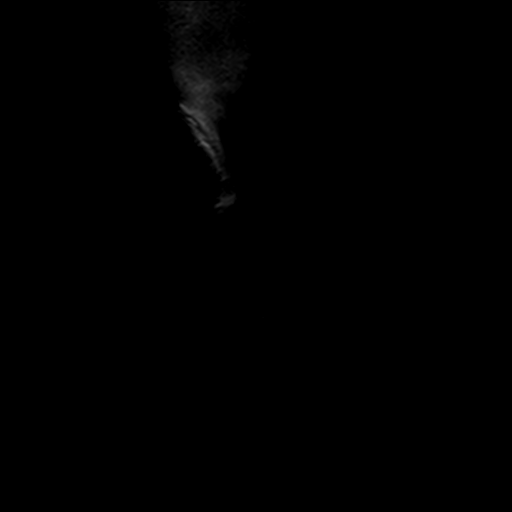
[im 13/38]
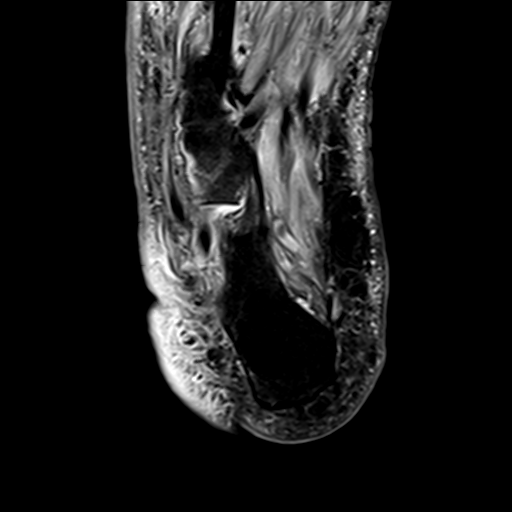
[im 25/38]
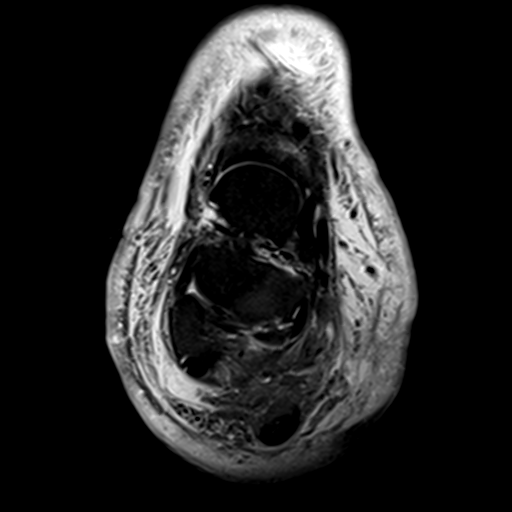
[im 38/38]
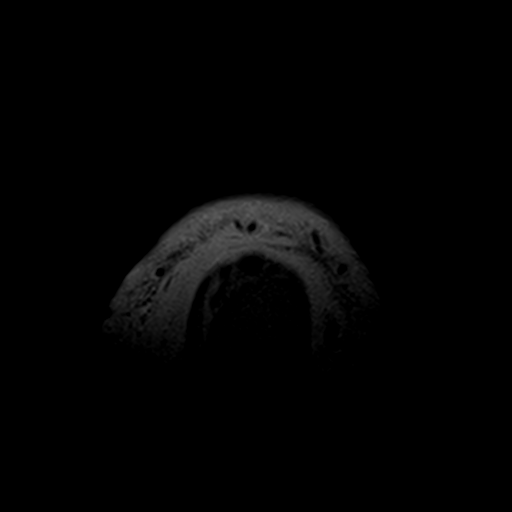

[Series 7: T1 · axial · 3.0mm · 0.59mm/px · z∈[-81,+34]mm · 4 of 38 slices shown (1 of 2)]
[im 1/38]
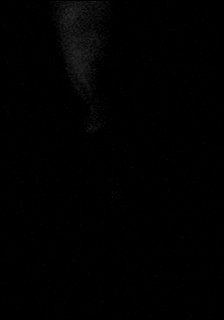
[im 13/38]
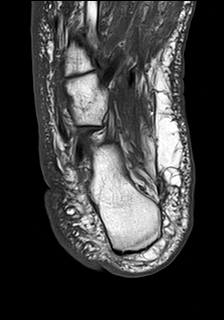
[im 25/38]
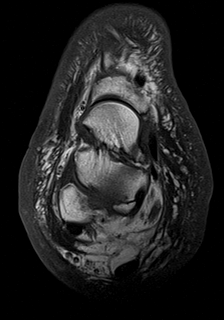
[im 38/38]
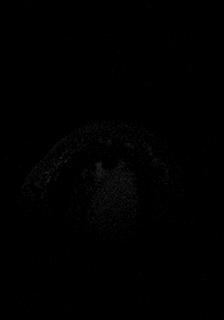

[Series 8: axial t1fs (only · axial · 3.0mm · 0.74mm/px · z∈[-81,+34]mm · 4 of 38 slices shown]
[im 1/38]
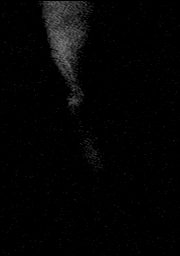
[im 13/38]
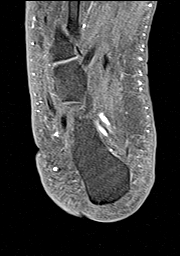
[im 25/38]
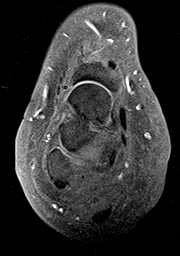
[im 38/38]
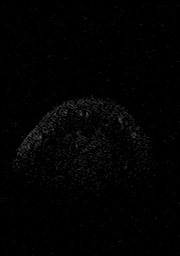

[Series 9: STIR · sagittal · 3.0mm · 0.35mm/px · 3 of 37 slices shown (1 of 2)]
[im 1/37]
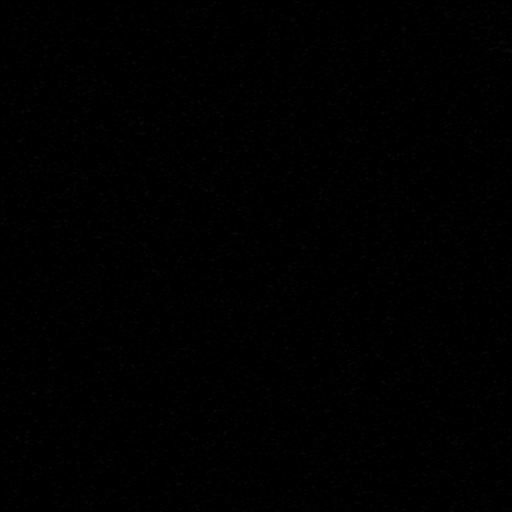
[im 19/37]
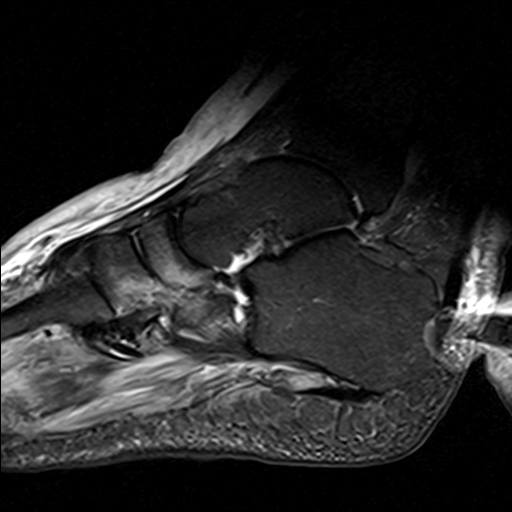
[im 37/37]
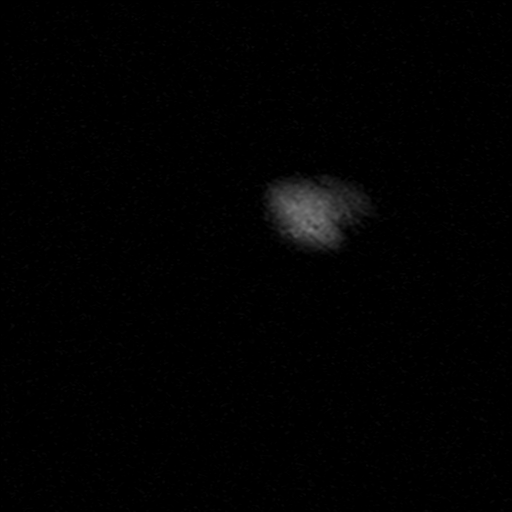

[Series 10: T1 · coronal · 3.0mm · 0.37mm/px · 5 of 54 slices shown (2 of 2)]
[im 1/54]
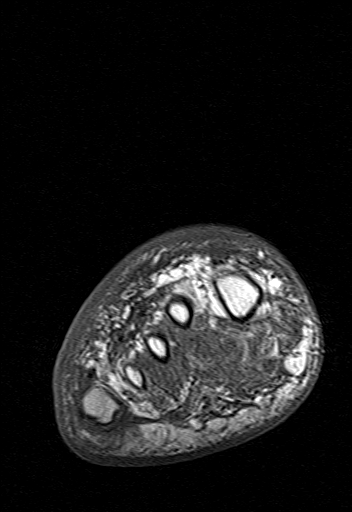
[im 14/54]
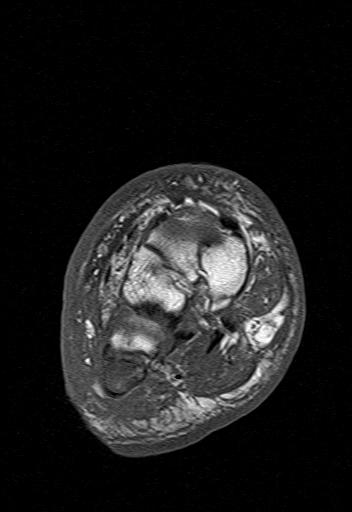
[im 27/54]
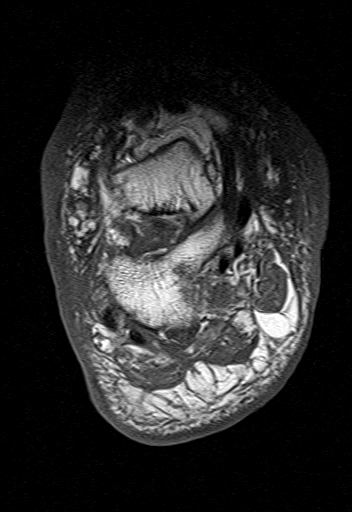
[im 40/54]
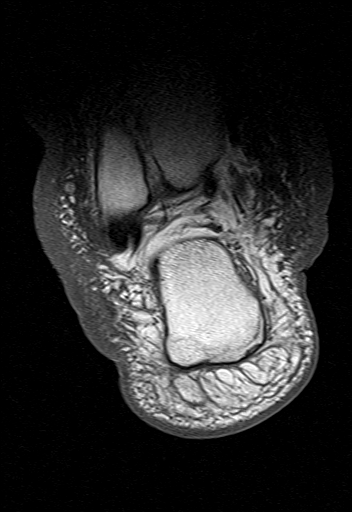
[im 54/54]
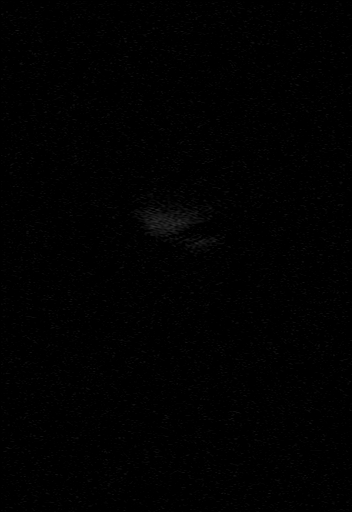

[Series 11: T2 fat-sat · sagittal · 3.0mm · 0.70mm/px · 3 of 37 slices shown (2 of 2)]
[im 1/37]
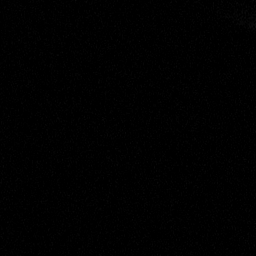
[im 19/37]
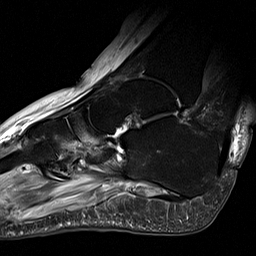
[im 37/37]
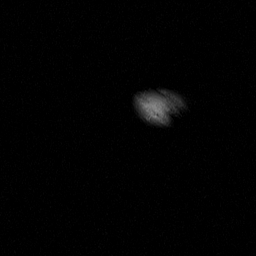

[Series 12: STIR · coronal · 3.0mm · 0.35mm/px · 2 of 54 slices shown (2 of 2)]
[im 1/54]
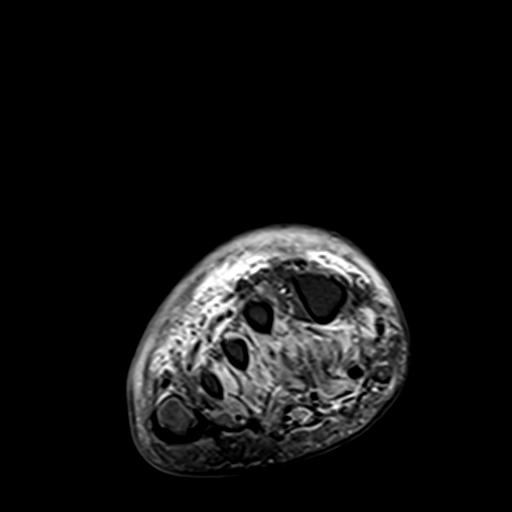
[im 14/54]
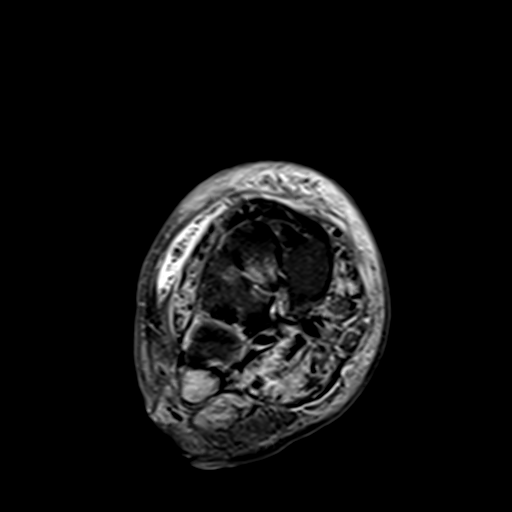

[Series 14: T1 fat-sat · coronal · 3.0mm · 0.70mm/px · 5 of 54 slices shown (1 of 2)]
[im 1/54]
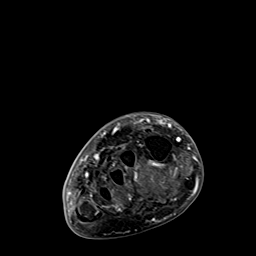
[im 14/54]
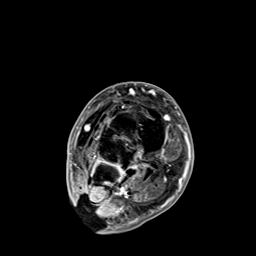
[im 27/54]
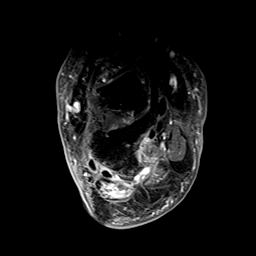
[im 40/54]
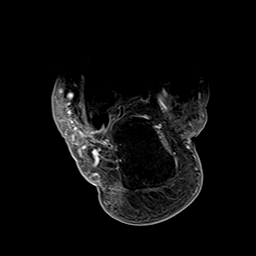
[im 54/54]
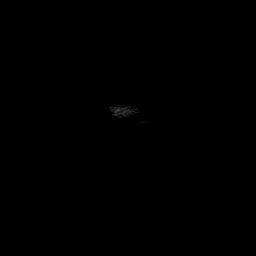

[Series 15: T1 fat-sat · sagittal · 3.0mm · 0.70mm/px · 3 of 36 slices shown (2 of 2)]
[im 1/36]
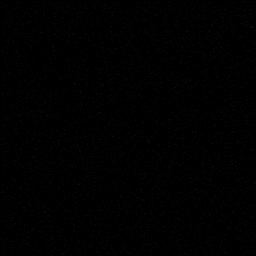
[im 18/36]
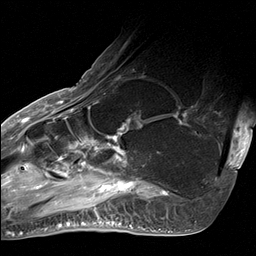
[im 36/36]
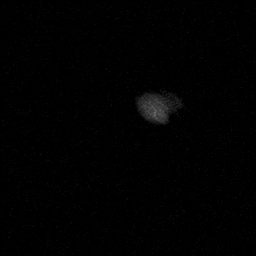

[33 of 40 positions shown; findings below may reference images not displayed]

FINDINGS: Diffuse subcutaneous edema in the distal calf and ankle noted. There
is an ulceration adjacent to the base of fifth metatarsal.
Underlying this ulceration there is a 7.6 by 3.9 by 2.0 cm volume of
hypoenhancing and likely necrotic tissue. This extends down to the
base of the fifth metatarsal which demonstrates diffuse abnormal
edema and enhancement compatible with osteomyelitis of the fifth
metatarsal. Edema and enhancement is most severe at the metatarsal
base but extends in the metatarsal shaft towards the metatarsal
head. The peroneus brevis appears to still inserts on the edematous
base of the fifth metatarsal in the peroneus longus skirts the
margin of the necrotic tissue. There is some tissue necrosis and
abnormal edema and enhancement in the abductor digiti minimi muscle
as it passes below the fifth metatarsal.

The area of soft tissue necrosis abuts the cuboid and there is some
faintly increased T2 signal and enhancement in this adjacent portion
of the cuboid near the peroneus longus tendon which could reflect an
early osteomyelitis for example images 11 through 14 of series 15.

There is also abnormal edema and enhancement in the bony articular
confluence of the navicular, middle and medial cuneiform, and cuboid
as shown on images 16 through 23 of series 11. Of these the most
severely involved is the navicular. I am uncertain whether this
edema and enhancement is due to early osteomyelitis or underlying
arthropathy.

Lisfranc ligament intact. There is peroneus longus and peroneus
brevis tenosynovitis. There is 5 mm non-fragmented osteochondral
lesion of the lateral talar dome on image 22 series 14. Cellulitis
noted in the foot especially severe in the vicinity of the
ulceration. Please note that the region of the metatarsal heads and
forefoot was not included on today's exam.
IMPRESSION: 1. 7.6 by 3.9 by 2.0 cm volume of hypoenhancing necrotic tissue
underlying the ulceration near the base of the fifth metatarsal
osteomyelitis of the base of the fifth metatarsal extending to the
margin of the fifth metatarsal. Diffuse osteomyelitis in the fifth
metatarsal especially severe at its base. Possible early
osteomyelitis involving the inferior-lateral cuboid, and also in the
navicular. Questionable early osteomyelitis in the middle and medial
cuneiforms.
2. Diffuse infiltrative edema in the calf and ankle compatible with
cellulitis.
3. Incidental 5 mm non-fragmented osteochondral lesion of the
lateral talar dome.

## 2018-09-06 ENCOUNTER — Ambulatory Visit (INDEPENDENT_AMBULATORY_CARE_PROVIDER_SITE_OTHER): Payer: Self-pay | Admitting: Vascular Surgery

## 2018-09-06 ENCOUNTER — Encounter (INDEPENDENT_AMBULATORY_CARE_PROVIDER_SITE_OTHER): Payer: Self-pay
